# Patient Record
Sex: Male | Born: 1950 | Race: White | Hispanic: No | Smoking: Never smoker
Health system: Southern US, Community
[De-identification: ages and names within clinical notes are randomized; demographics above are authoritative.]

## PROBLEM LIST (undated history)

## (undated) DIAGNOSIS — Z87442 Personal history of urinary calculi: Secondary | ICD-10-CM

## (undated) DIAGNOSIS — E119 Type 2 diabetes mellitus without complications: Secondary | ICD-10-CM

## (undated) HISTORY — PX: LITHOTRIPSY: SUR834

---

## 2003-01-01 ENCOUNTER — Encounter: Admission: RE | Admit: 2003-01-01 | Discharge: 2003-01-01 | Payer: Self-pay | Admitting: Family Medicine

## 2010-06-09 ENCOUNTER — Other Ambulatory Visit: Payer: Self-pay | Admitting: Orthopaedic Surgery

## 2010-06-09 DIAGNOSIS — M545 Low back pain: Secondary | ICD-10-CM

## 2010-06-10 ENCOUNTER — Ambulatory Visit
Admission: RE | Admit: 2010-06-10 | Discharge: 2010-06-10 | Disposition: A | Discharge status: Home / Self Care | Payer: 59 | Source: Ambulatory Visit | Attending: Orthopaedic Surgery | Admitting: Orthopaedic Surgery

## 2010-06-10 DIAGNOSIS — M545 Low back pain, unspecified: Secondary | ICD-10-CM

## 2010-07-23 ENCOUNTER — Other Ambulatory Visit: Payer: Self-pay | Admitting: Family Medicine

## 2010-07-23 DIAGNOSIS — R1030 Lower abdominal pain, unspecified: Secondary | ICD-10-CM

## 2010-07-29 ENCOUNTER — Ambulatory Visit
Admission: RE | Admit: 2010-07-29 | Discharge: 2010-07-29 | Disposition: A | Discharge status: Home / Self Care | Payer: 59 | Source: Ambulatory Visit | Attending: Family Medicine | Admitting: Family Medicine

## 2010-07-29 DIAGNOSIS — R1030 Lower abdominal pain, unspecified: Secondary | ICD-10-CM

## 2010-07-29 MED ORDER — IOHEXOL 300 MG/ML  SOLN
100.0000 mL | Freq: Once | INTRAMUSCULAR | Status: AC | PRN
  Administered 2010-07-29: 100 mL via INTRAVENOUS

## 2010-08-21 ENCOUNTER — Telehealth: Payer: Self-pay | Admitting: Cardiology

## 2010-08-21 NOTE — Telephone Encounter (Signed)
Called wanting to schedule an appointment. Had a CT scan (accession number: [id number]) from [facility], and his Prime Doctor saw some calcifications in his Coronary Artery and thought he might have coronary Artery disease and advised him to follow up with his cardiologist. HE's also noticed the veins in his legs starting to bulge out and it seems odd  He doesn't feel comfortable waiting until August. Please call back.

## 2010-08-25 ENCOUNTER — Other Ambulatory Visit: Payer: Self-pay | Admitting: Family Medicine

## 2010-08-25 DIAGNOSIS — M79609 Pain in unspecified limb: Secondary | ICD-10-CM

## 2010-08-27 ENCOUNTER — Ambulatory Visit
Admission: RE | Admit: 2010-08-27 | Discharge: 2010-08-27 | Disposition: A | Discharge status: Home / Self Care | Payer: 59 | Source: Ambulatory Visit | Attending: Family Medicine | Admitting: Family Medicine

## 2010-08-27 ENCOUNTER — Other Ambulatory Visit: Payer: 59

## 2010-08-27 DIAGNOSIS — M79609 Pain in unspecified limb: Secondary | ICD-10-CM

## 2010-09-18 ENCOUNTER — Other Ambulatory Visit (HOSPITAL_BASED_OUTPATIENT_CLINIC_OR_DEPARTMENT_OTHER): Payer: Self-pay | Admitting: Family Medicine

## 2010-09-18 DIAGNOSIS — R634 Abnormal weight loss: Secondary | ICD-10-CM

## 2010-09-19 ENCOUNTER — Other Ambulatory Visit (HOSPITAL_BASED_OUTPATIENT_CLINIC_OR_DEPARTMENT_OTHER): Payer: 59

## 2010-09-23 ENCOUNTER — Ambulatory Visit (INDEPENDENT_AMBULATORY_CARE_PROVIDER_SITE_OTHER)
Admission: RE | Admit: 2010-09-23 | Discharge: 2010-09-23 | Disposition: A | Discharge status: Home / Self Care | Payer: 59 | Source: Ambulatory Visit | Attending: Family Medicine | Admitting: Family Medicine

## 2010-09-23 ENCOUNTER — Ambulatory Visit (HOSPITAL_BASED_OUTPATIENT_CLINIC_OR_DEPARTMENT_OTHER)
Admission: RE | Admit: 2010-09-23 | Discharge: 2010-09-23 | Disposition: A | Discharge status: Home / Self Care | Payer: 59 | Source: Ambulatory Visit | Attending: Family Medicine | Admitting: Family Medicine

## 2010-09-23 DIAGNOSIS — K5730 Diverticulosis of large intestine without perforation or abscess without bleeding: Secondary | ICD-10-CM

## 2010-09-23 DIAGNOSIS — N400 Enlarged prostate without lower urinary tract symptoms: Secondary | ICD-10-CM | POA: Insufficient documentation

## 2010-09-23 DIAGNOSIS — N509 Disorder of male genital organs, unspecified: Secondary | ICD-10-CM

## 2010-09-23 DIAGNOSIS — R109 Unspecified abdominal pain: Secondary | ICD-10-CM

## 2010-09-23 DIAGNOSIS — N508 Other specified disorders of male genital organs: Secondary | ICD-10-CM | POA: Insufficient documentation

## 2010-09-23 DIAGNOSIS — Q619 Cystic kidney disease, unspecified: Secondary | ICD-10-CM | POA: Insufficient documentation

## 2010-09-23 DIAGNOSIS — R634 Abnormal weight loss: Secondary | ICD-10-CM

## 2010-09-23 DIAGNOSIS — E119 Type 2 diabetes mellitus without complications: Secondary | ICD-10-CM | POA: Insufficient documentation

## 2010-09-23 MED ORDER — GADOBENATE DIMEGLUMINE 529 MG/ML IV SOLN
15.0000 mL | Freq: Once | INTRAVENOUS | Status: AC | PRN
  Administered 2010-09-23: 15 mL via INTRAVENOUS

## 2011-08-19 ENCOUNTER — Other Ambulatory Visit: Payer: Self-pay | Admitting: Cardiology

## 2011-08-19 ENCOUNTER — Encounter: Payer: Self-pay | Admitting: Cardiology

## 2011-11-04 ENCOUNTER — Encounter: Payer: Self-pay | Admitting: Cardiology

## 2012-04-16 IMAGING — US US EXTREM LOW VENOUS*L*
1 series · 14 of 24 positions shown · non-contrast
Comparison: None.

CLINICAL DATA: Left leg pain

LEFT LOWER EXTREMITY VENOUS DUPLEX ULTRASOUND
TECHNIQUE: Gray-scale sonography with graded compression, as well
as color Doppler and duplex ultrasound, were performed to evaluate
the deep venous system of the lower extremity from the level of the
common femoral vein through the popliteal and proximal calf veins.
Spectral Doppler was utilized to evaluate flow at rest and with
distal augmentation maneuvers.

[Series 1: us extrem low venous*left* · 14 of 27 slices shown]
[im 1/27]
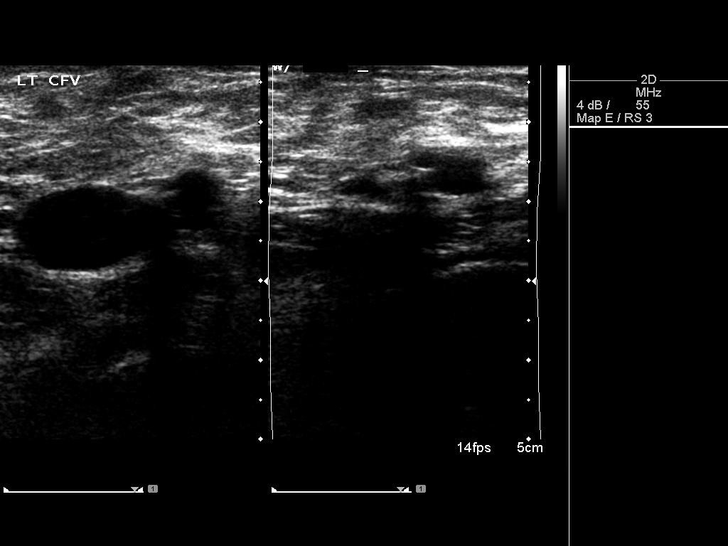
[im 3/27]
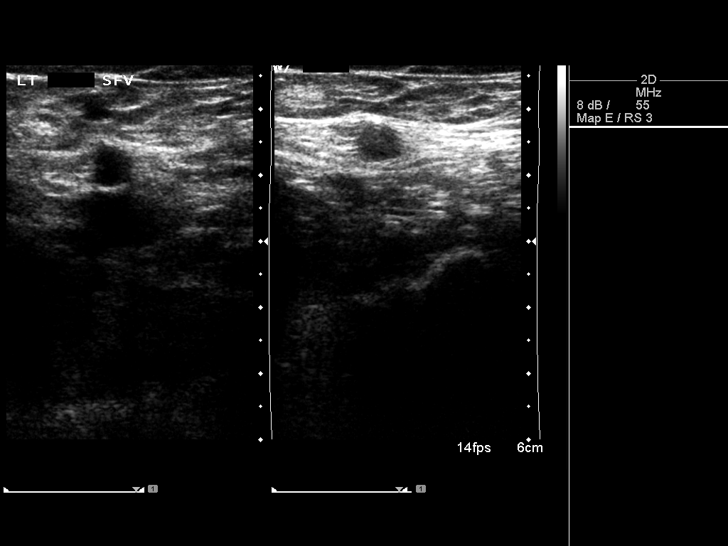
[im 5/27]
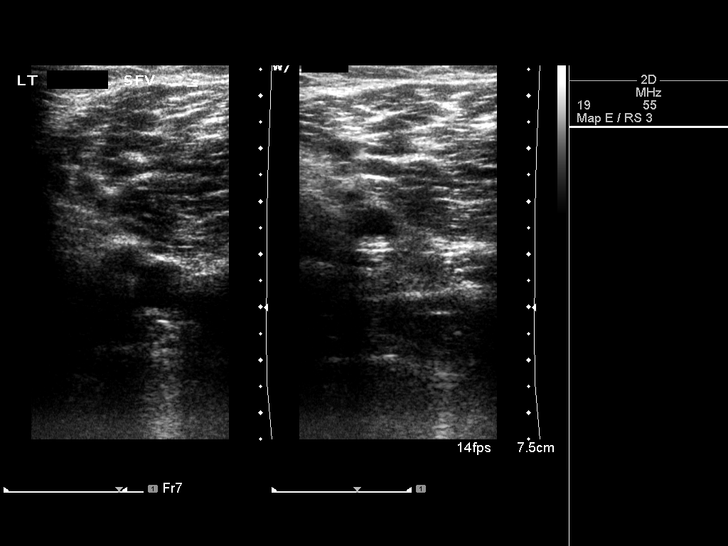
[im 7/27]
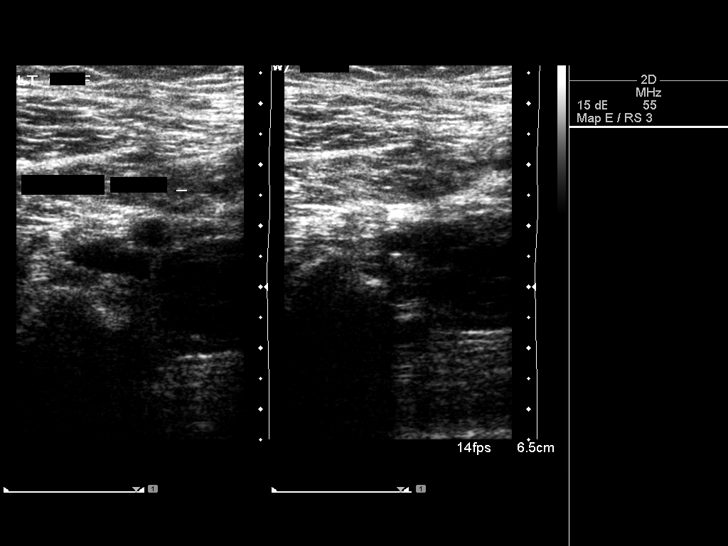
[im 8/27]
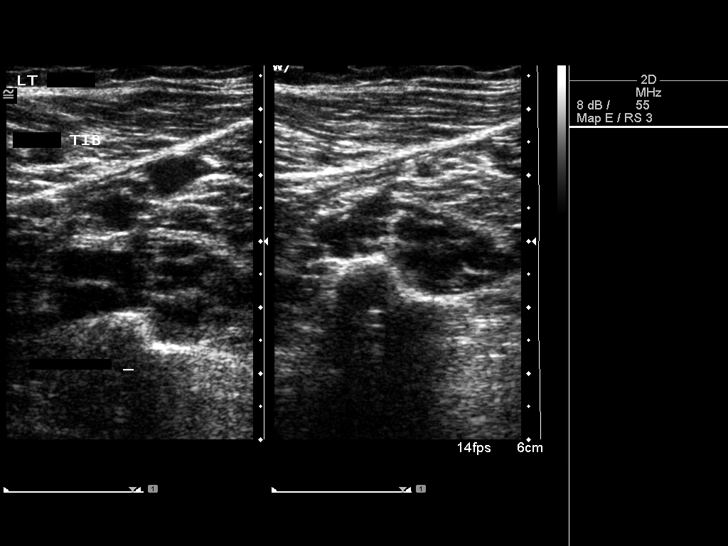
[im 11/27]
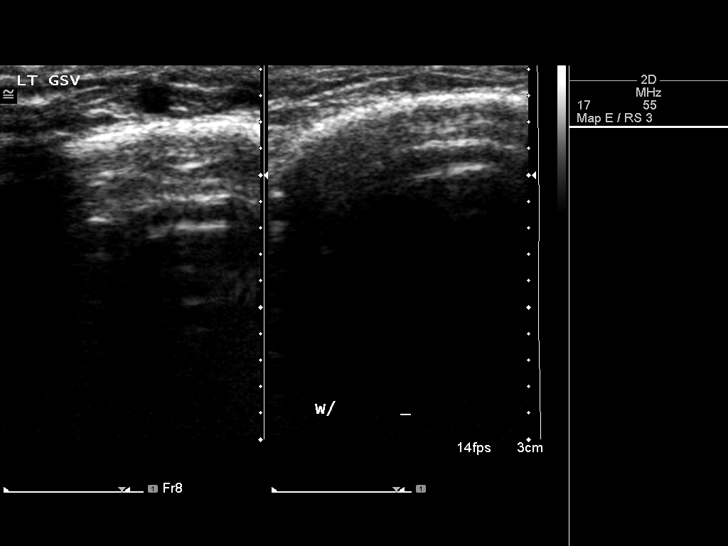
[im 13/27]
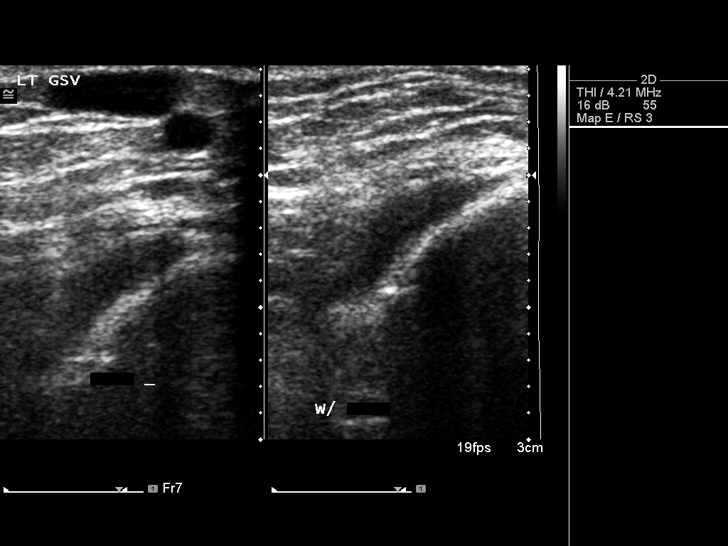
[im 14/27]
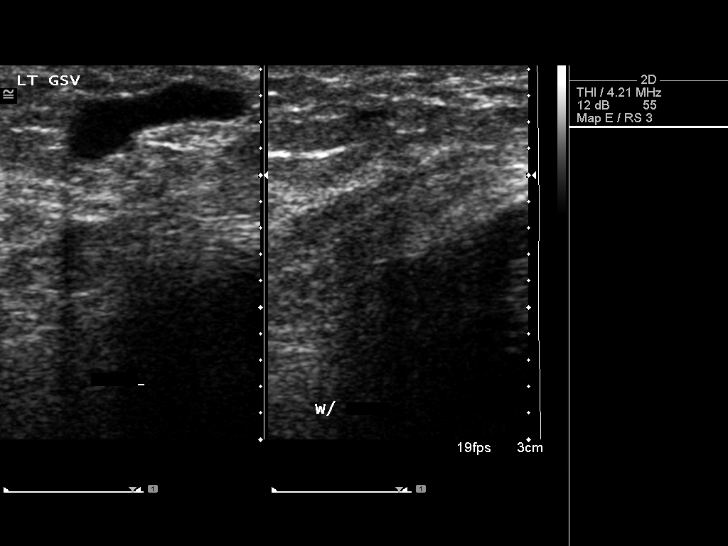
[im 16/27]
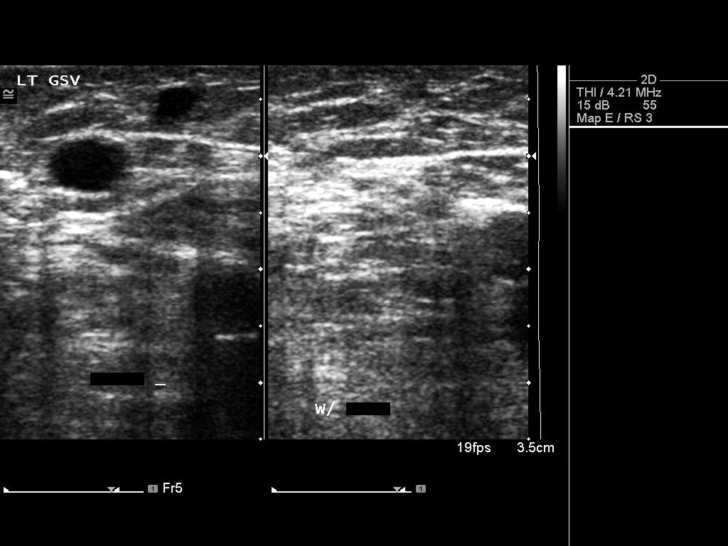
[im 19/27]
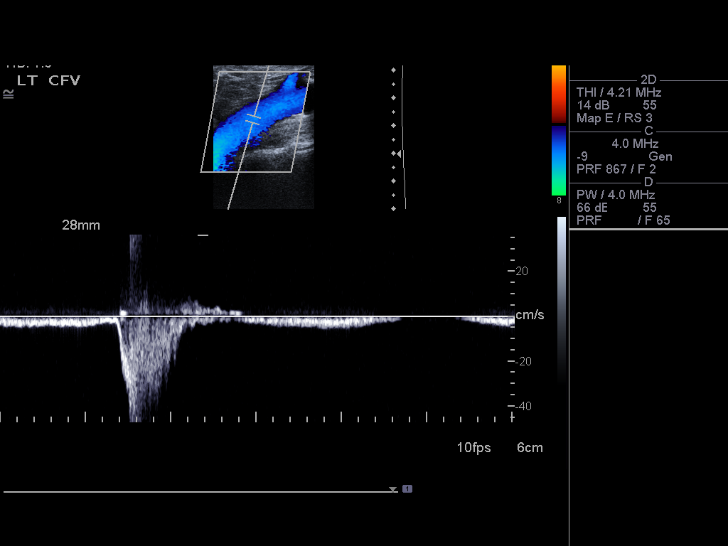
[im 21/27]
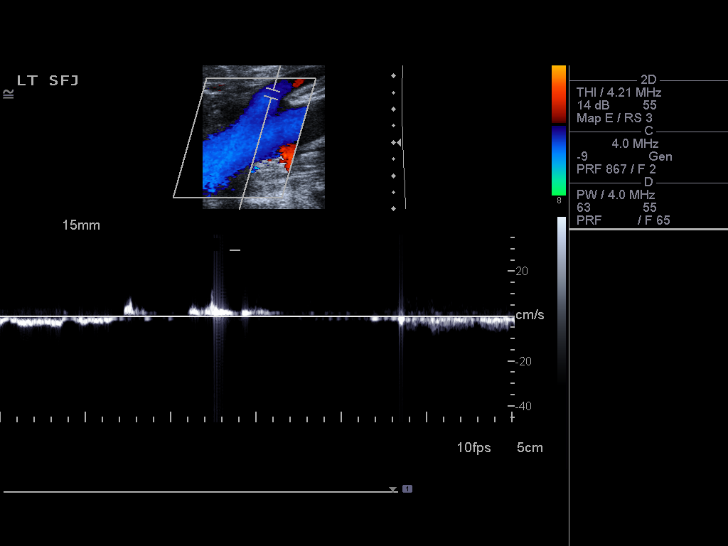
[im 22/27]
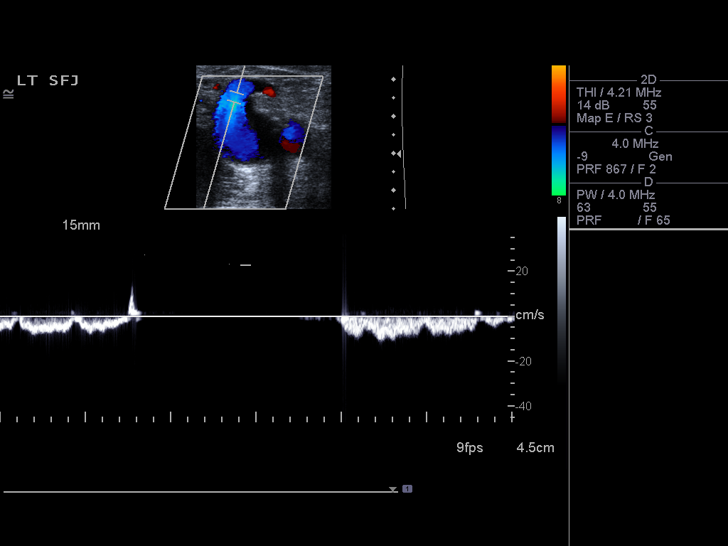
[im 24/27]
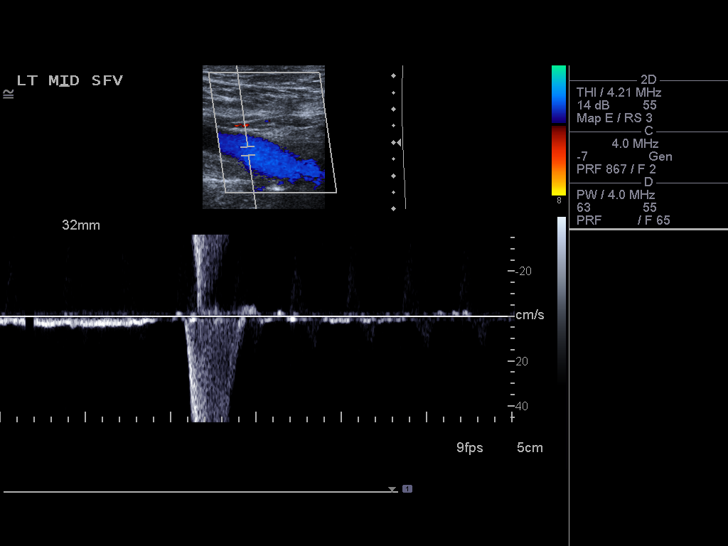
[im 27/27]
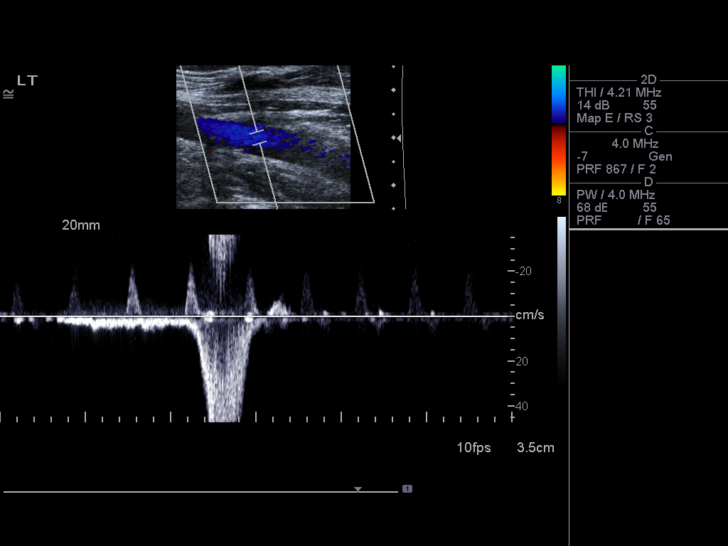

[14 of 24 positions shown; findings below may reference images not displayed]

FINDINGS: There is complete compressibility of the left common
femoral, left femoral, and left popliteal veins.  Doppler analysis
demonstrates respiratory phasicity and augmentation of flow upon
calf compression.

Innumerable varicosities are seen in the subcutaneous tissue of the
thigh and leg.
IMPRESSION: No evidence of DVT in the left lower extremity

Varicosities are noted in the thigh and leg.  Venous insufficiency
may be present.  Correlate clinically as for the need for
consultation with a vein specialist.

## 2014-03-13 DIAGNOSIS — G4700 Insomnia, unspecified: Secondary | ICD-10-CM | POA: Diagnosis not present

## 2014-03-13 DIAGNOSIS — E780 Pure hypercholesterolemia: Secondary | ICD-10-CM | POA: Diagnosis not present

## 2014-03-13 DIAGNOSIS — Z125 Encounter for screening for malignant neoplasm of prostate: Secondary | ICD-10-CM | POA: Diagnosis not present

## 2014-03-13 DIAGNOSIS — I10 Essential (primary) hypertension: Secondary | ICD-10-CM | POA: Diagnosis not present

## 2014-03-13 DIAGNOSIS — E119 Type 2 diabetes mellitus without complications: Secondary | ICD-10-CM | POA: Diagnosis not present

## 2014-09-25 DIAGNOSIS — G4700 Insomnia, unspecified: Secondary | ICD-10-CM | POA: Diagnosis not present

## 2014-09-25 DIAGNOSIS — I10 Essential (primary) hypertension: Secondary | ICD-10-CM | POA: Diagnosis not present

## 2014-09-25 DIAGNOSIS — Z23 Encounter for immunization: Secondary | ICD-10-CM | POA: Diagnosis not present

## 2014-09-25 DIAGNOSIS — E119 Type 2 diabetes mellitus without complications: Secondary | ICD-10-CM | POA: Diagnosis not present

## 2014-09-25 DIAGNOSIS — E780 Pure hypercholesterolemia: Secondary | ICD-10-CM | POA: Diagnosis not present

## 2014-11-15 DIAGNOSIS — R109 Unspecified abdominal pain: Secondary | ICD-10-CM | POA: Diagnosis not present

## 2014-11-15 DIAGNOSIS — N201 Calculus of ureter: Secondary | ICD-10-CM | POA: Diagnosis not present

## 2014-11-19 ENCOUNTER — Other Ambulatory Visit: Payer: Self-pay | Admitting: Urology

## 2014-11-20 ENCOUNTER — Encounter (HOSPITAL_COMMUNITY): Payer: Self-pay | Admitting: *Deleted

## 2014-11-24 NOTE — Discharge Instructions (Signed)

## 2014-11-25 ENCOUNTER — Ambulatory Visit (HOSPITAL_COMMUNITY)
Admission: RE | Admit: 2014-11-25 | Discharge: 2014-11-25 | Disposition: A | Discharge status: Home / Self Care | Payer: Medicare Other | Source: Ambulatory Visit | Attending: Urology | Admitting: Urology

## 2014-11-25 ENCOUNTER — Ambulatory Visit (HOSPITAL_COMMUNITY): Payer: Medicare Other

## 2014-11-25 ENCOUNTER — Encounter (HOSPITAL_COMMUNITY): Payer: Self-pay | Admitting: *Deleted

## 2014-11-25 ENCOUNTER — Encounter (HOSPITAL_COMMUNITY)
Admission: RE | Disposition: A | Discharge status: Home / Self Care | Payer: Self-pay | Source: Ambulatory Visit | Attending: Urology

## 2014-11-25 DIAGNOSIS — N400 Enlarged prostate without lower urinary tract symptoms: Secondary | ICD-10-CM | POA: Diagnosis not present

## 2014-11-25 DIAGNOSIS — R109 Unspecified abdominal pain: Secondary | ICD-10-CM | POA: Diagnosis present

## 2014-11-25 DIAGNOSIS — N201 Calculus of ureter: Secondary | ICD-10-CM | POA: Insufficient documentation

## 2014-11-25 DIAGNOSIS — E119 Type 2 diabetes mellitus without complications: Secondary | ICD-10-CM | POA: Diagnosis not present

## 2014-11-25 DIAGNOSIS — Z79899 Other long term (current) drug therapy: Secondary | ICD-10-CM | POA: Insufficient documentation

## 2014-11-25 DIAGNOSIS — Z791 Long term (current) use of non-steroidal anti-inflammatories (NSAID): Secondary | ICD-10-CM | POA: Diagnosis not present

## 2014-11-25 DIAGNOSIS — E780 Pure hypercholesterolemia: Secondary | ICD-10-CM | POA: Insufficient documentation

## 2014-11-25 DIAGNOSIS — Z87442 Personal history of urinary calculi: Secondary | ICD-10-CM | POA: Diagnosis not present

## 2014-11-25 DIAGNOSIS — Z841 Family history of disorders of kidney and ureter: Secondary | ICD-10-CM | POA: Insufficient documentation

## 2014-11-25 DIAGNOSIS — Z87891 Personal history of nicotine dependence: Secondary | ICD-10-CM | POA: Insufficient documentation

## 2014-11-25 DIAGNOSIS — I10 Essential (primary) hypertension: Secondary | ICD-10-CM | POA: Insufficient documentation

## 2014-11-25 DIAGNOSIS — Z7982 Long term (current) use of aspirin: Secondary | ICD-10-CM | POA: Diagnosis not present

## 2014-11-25 DIAGNOSIS — N200 Calculus of kidney: Secondary | ICD-10-CM | POA: Diagnosis not present

## 2014-11-25 HISTORY — DX: Personal history of urinary calculi: Z87.442

## 2014-11-25 HISTORY — DX: Type 2 diabetes mellitus without complications: E11.9

## 2014-11-25 LAB — GLUCOSE, CAPILLARY: Glucose-Capillary: 192 mg/dL — ABNORMAL HIGH (ref 65–99)

## 2014-11-25 SURGERY — LITHOTRIPSY, ESWL
Anesthesia: LOCAL | Laterality: Left

## 2014-11-25 MED ORDER — CIPROFLOXACIN HCL 500 MG PO TABS
500.0000 mg | ORAL_TABLET | ORAL | Status: AC
  Administered 2014-11-25: 500 mg via ORAL
  Filled 2014-11-25: qty 1

## 2014-11-25 MED ORDER — TAMSULOSIN HCL 0.4 MG PO CAPS: 0.4000 mg | ORAL_CAPSULE | ORAL | Status: DC

## 2014-11-25 MED ORDER — OXYCODONE HCL 10 MG PO TABS: 10.0000 mg | ORAL_TABLET | ORAL | Status: DC | PRN

## 2014-11-25 MED ORDER — DIPHENHYDRAMINE HCL 25 MG PO CAPS
25.0000 mg | ORAL_CAPSULE | ORAL | Status: AC
  Administered 2014-11-25: 25 mg via ORAL
  Filled 2014-11-25: qty 1

## 2014-11-25 MED ORDER — SODIUM CHLORIDE 0.9 % IV SOLN
INTRAVENOUS | Status: DC
  Administered 2014-11-25: 09:00:00 via INTRAVENOUS

## 2014-11-25 MED ORDER — DIAZEPAM 5 MG PO TABS
10.0000 mg | ORAL_TABLET | ORAL | Status: AC
  Administered 2014-11-25: 10 mg via ORAL
  Filled 2014-11-25: qty 2

## 2014-11-25 NOTE — H&P (Signed)
History of Present Illness               Patient was seen recently with acute onset of left flank pain. Nothing seems to make the pain better or worse. It has eased off over the past 48 hours. He now rates it as a "6 of 10". He had nausea and vomiting 2 days ago. Pain seems to be more on the left side may be moving toward the left lower quadrant now. He said no dysuria or gross hematuria. He's had a weak stream. He has a history of kidney stones but has not seen a stone pass.      Prior GU hx:  1- pelvic pain - chronic pain in perineum. Pain is between scrotum and rectum. Hurts with BM. He has constipation. Started flomax for the pain. Also tried mobic, cipro and scrotal support. Cipro for 30 days. None of these have helped. He has lost weight. He complains of impotence and no ejaculate volume. PSA 1.52 Jan 2012. Nov 2011 scrotal US normal. He had a CT Abd/pelvis Jul 29, 2010 that showed showed bilateral renal calculi and a right lower pole renal lesion that measured 1.1 cm that was indeterminate and probably a complex cyst. July 2012 patient underwent MRI Abd and Pelvis - I reviewed images - no hydronephrosis, cyst in right kidney confirmed simple, BPH, otherwise normal. An MRI of his lumbar spine April 2012 negative (done for pain in his lower back).     2 - BPH - He complains of impotence and no ejaculate volume. His DRE normal July 2012. PSA 1.52 Jan 2012. He has seen an endocrinologist due to the weight loss and per pt everything was normal. Also has had colonoscopy. Stopped tamsulosin and Potassium citrate as well as some other meds to see if there was any medicine causing him to lose weight.    3 - Microhematuria - cystoscopy was performed July 2012 and was normal apart from BPH and a large median lobe.     4 - history of stones. He passed a stone in Dec 2011 which was Calcium Oxalate. Urorisk 2011 showed His total volume was 1.6 L, his urinary citrate was very low at 274, his calcium  and oxalate were normal, but he had over saturation of calcium oxalate and brushite.       5-PCa screening-  -Jun 2013 PSA 2.2   -Jan 2016 PSA 2.4         Past Medical History Problems  1. History of Epididymitis, right (N45.1) 2. History of diabetes mellitus (Z86.39) 3. History of hypercholesterolemia (Z86.39)  Surgical History Problems  1. History of No Surgical Problems  Current Meds 1. Aspirin 81 MG TABS;  Therapy: (Recorded:13Jun2012) to Recorded 2. Ibuprofen CAPS;  Therapy: (Recorded:13Jun2012) to Recorded 3. Lisinopril 5 MG Oral Tablet;  Therapy: (Recorded:16Sep2016) to Recorded 4. MetFORMIN HCl - 500 MG Oral Tablet; TAKE 1 TABLET DAILY;  Therapy: (Recorded:12Jun2013) to Recorded 5. Zolpidem Tartrate TABS; TAKE 1 TABLET DAILY AT BEDTIME;  Therapy: (Recorded:30Nov2011) to Recorded  Allergies Medication  1. No Known Drug Allergies  Family History Problems  1. Family history of Family Health Status Number Of Children   1 daughter 2. Family history of Hematuria : Father 3. Family history of Nephrolithiasis : Father  Social History Problems    Alcohol Use   1   Caffeine Use   3 cups; coffee   Former smoker [phone])   smoked for 5 yrs & quit in 1976  Marital History - Currently Married   Retired From Work  Review of Systems Genitourinary, constitutional, skin, eye, otolaryngeal, hematologic/lymphatic, cardiovascular, pulmonary, endocrine, musculoskeletal, gastrointestinal, neurological and psychiatric system(s) were reviewed and pertinent findings if present are noted and are otherwise negative.  Gastrointestinal: nausea and vomiting.  Constitutional: fever.  Hematologic/Lymphatic: a tendency to easily bruise.    Vitals Vital Signs  Height: 5 ft 6 in Weight: 165 lb  BMI Calculated: 26.63 BSA Calculated: 1.84 Blood Pressure: 154 / 97 Temperature: 98.3 F Heart Rate: 89  Physical Exam Constitutional: Well nourished and well  developed . No acute distress.  ENT:. The ears and nose are normal in appearance.  Neck: The appearance of the neck is normal and no neck mass is present.  Pulmonary: No respiratory distress and normal respiratory rhythm and effort.  Cardiovascular: Heart rate and rhythm are normal . No peripheral edema.  Abdomen: The abdomen is soft and nontender. No masses are palpated. No CVA tenderness. No hernias are palpable. No hepatosplenomegaly noted.  Rectal: Rectal exam demonstrates normal sphincter tone, no tenderness and no masses. The prostate has no nodularity and is not tender. The left seminal vesicle is nonpalpable. The right seminal vesicle is nonpalpable. The perineum is normal on inspection.  Genitourinary: Examination of the penis demonstrates no discharge, no masses, no lesions and a normal meatus. The scrotum is without lesions. The right epididymis is palpably normal and non-tender. The left epididymis is palpably normal and non-tender. The right testis is non-tender and without masses. The left testis is non-tender and without masses.  Lymphatics: The femoral and inguinal nodes are not enlarged or tender.  Skin: Normal skin turgor, no visible rash and no visible skin lesions.  Neuro/Psych:. Mood and affect are appropriate.    Results/Data Urine COLOR YELLOW  APPEARANCE CLEAR  SPECIFIC GRAVITY <1.005  pH 6.0  GLUCOSE 2+  BILIRUBIN NEGATIVE  KETONE NEGATIVE  BLOOD 3+  PROTEIN NEGATIVE  NITRITE NEGATIVE  LEUKOCYTE ESTERASE NEGATIVE  SQUAMOUS EPITHELIAL/HPF NONE SEEN HPF WBC 0-5 WBC/HPF RBC 0-2 RBC/HPF BACTERIA NONE SEEN HPF CRYSTALS NONE SEEN HPF CASTS NONE SEEN LPF Yeast NONE SEEN HPF    Assessment Left ureteral stone   Plan  Left ureteral stone-I discussed with the patient the nature of risks benefits and alternatives to continued stone passage, addition of all fluid will call for blocker use, shockwave lithotripsy or ureteroscopy. All questions answered. Patient has  had a stent before and hoped to avoid another one. He wants to think about it over the weekend and will let me know Monday what he wants to do. He is considering shockwave lithotripsy otherwise I made a follow up with one of the nurse practitioners for a week or 2 with a KUB to check his progress. We could then set him up for shockwave lithotripsy if he hasn't passed the stone.

## 2014-11-25 NOTE — Op Note (Signed)
See [facility] OP note scanned into chart. Also because of the size, density, location and other factors that cannot be anticipated I feel this will likely be a staged procedure. This fact supersedes any indication in the scanned [facility] operative note to the contrary.  

## 2014-12-09 DIAGNOSIS — N201 Calculus of ureter: Secondary | ICD-10-CM | POA: Diagnosis not present

## 2015-02-17 DIAGNOSIS — E119 Type 2 diabetes mellitus without complications: Secondary | ICD-10-CM | POA: Diagnosis not present

## 2015-04-04 DIAGNOSIS — E7800 Pure hypercholesterolemia, unspecified: Secondary | ICD-10-CM | POA: Diagnosis not present

## 2015-04-04 DIAGNOSIS — Z0000 Encounter for general adult medical examination without abnormal findings: Secondary | ICD-10-CM | POA: Diagnosis not present

## 2015-04-04 DIAGNOSIS — I10 Essential (primary) hypertension: Secondary | ICD-10-CM | POA: Diagnosis not present

## 2015-04-04 DIAGNOSIS — E119 Type 2 diabetes mellitus without complications: Secondary | ICD-10-CM | POA: Diagnosis not present

## 2015-04-04 DIAGNOSIS — G4700 Insomnia, unspecified: Secondary | ICD-10-CM | POA: Diagnosis not present

## 2015-04-04 DIAGNOSIS — Z7984 Long term (current) use of oral hypoglycemic drugs: Secondary | ICD-10-CM | POA: Diagnosis not present

## 2015-11-04 DIAGNOSIS — I10 Essential (primary) hypertension: Secondary | ICD-10-CM | POA: Diagnosis not present

## 2015-11-04 DIAGNOSIS — Z1159 Encounter for screening for other viral diseases: Secondary | ICD-10-CM | POA: Diagnosis not present

## 2015-11-04 DIAGNOSIS — E7800 Pure hypercholesterolemia, unspecified: Secondary | ICD-10-CM | POA: Diagnosis not present

## 2015-11-04 DIAGNOSIS — G4700 Insomnia, unspecified: Secondary | ICD-10-CM | POA: Diagnosis not present

## 2015-11-04 DIAGNOSIS — Z23 Encounter for immunization: Secondary | ICD-10-CM | POA: Diagnosis not present

## 2015-11-04 DIAGNOSIS — E119 Type 2 diabetes mellitus without complications: Secondary | ICD-10-CM | POA: Diagnosis not present

## 2015-12-23 DIAGNOSIS — Z23 Encounter for immunization: Secondary | ICD-10-CM | POA: Diagnosis not present

## 2016-05-12 DIAGNOSIS — E7800 Pure hypercholesterolemia, unspecified: Secondary | ICD-10-CM | POA: Diagnosis not present

## 2016-05-12 DIAGNOSIS — I2510 Atherosclerotic heart disease of native coronary artery without angina pectoris: Secondary | ICD-10-CM | POA: Diagnosis not present

## 2016-05-12 DIAGNOSIS — I10 Essential (primary) hypertension: Secondary | ICD-10-CM | POA: Diagnosis not present

## 2016-05-12 DIAGNOSIS — E119 Type 2 diabetes mellitus without complications: Secondary | ICD-10-CM | POA: Diagnosis not present

## 2016-05-12 DIAGNOSIS — G4700 Insomnia, unspecified: Secondary | ICD-10-CM | POA: Diagnosis not present

## 2016-05-12 DIAGNOSIS — Z0000 Encounter for general adult medical examination without abnormal findings: Secondary | ICD-10-CM | POA: Diagnosis not present

## 2016-07-15 IMAGING — CR DG ABDOMEN 1V
2 series · 2 of 2 positions shown · non-contrast
Comparison: CT scan of November 15, 2014.

CLINICAL DATA: Left ureteral stone.

EXAM:
ABDOMEN - 1 VIEW

[t abdomen supine (1 of 2)]
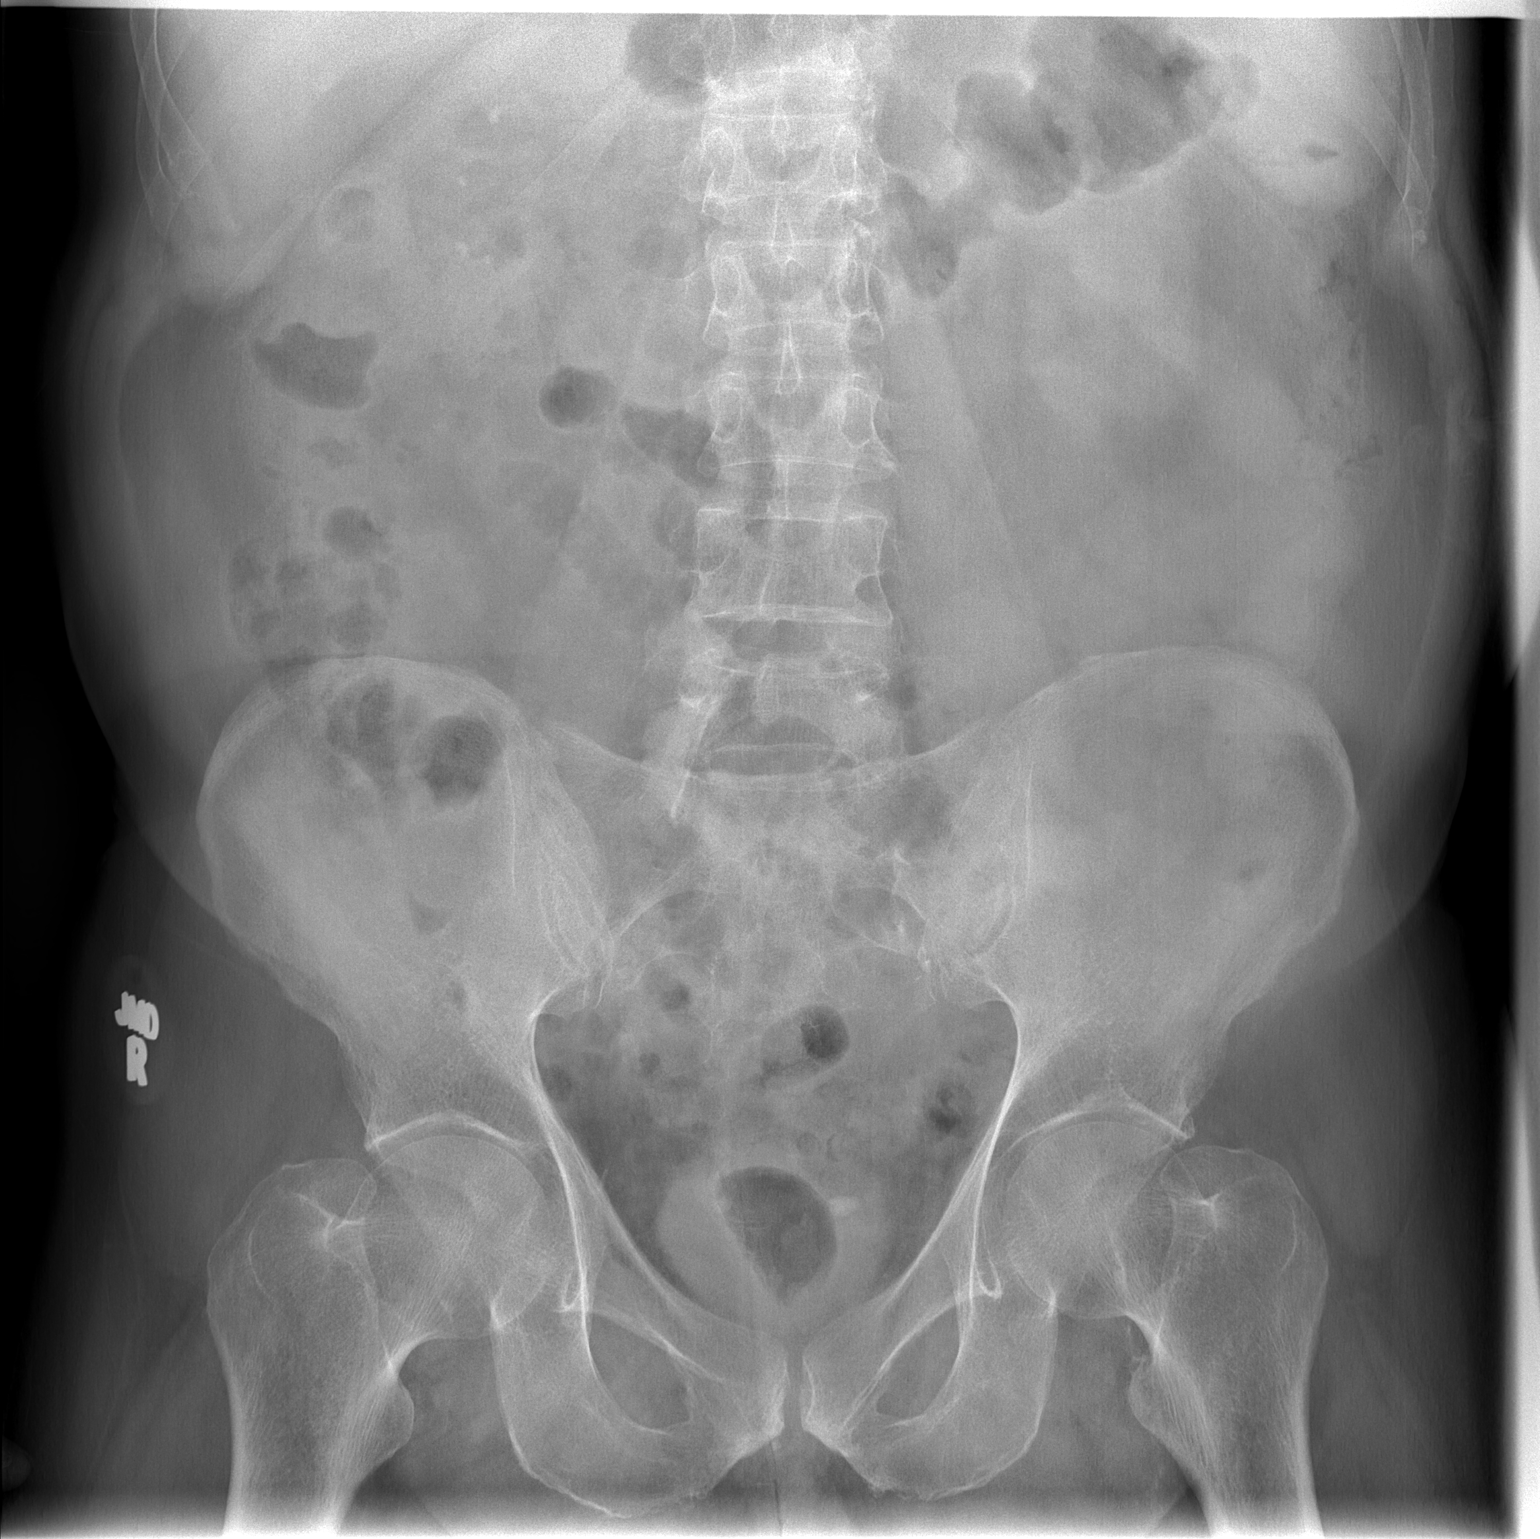

[t abdomen supine (2 of 2)]
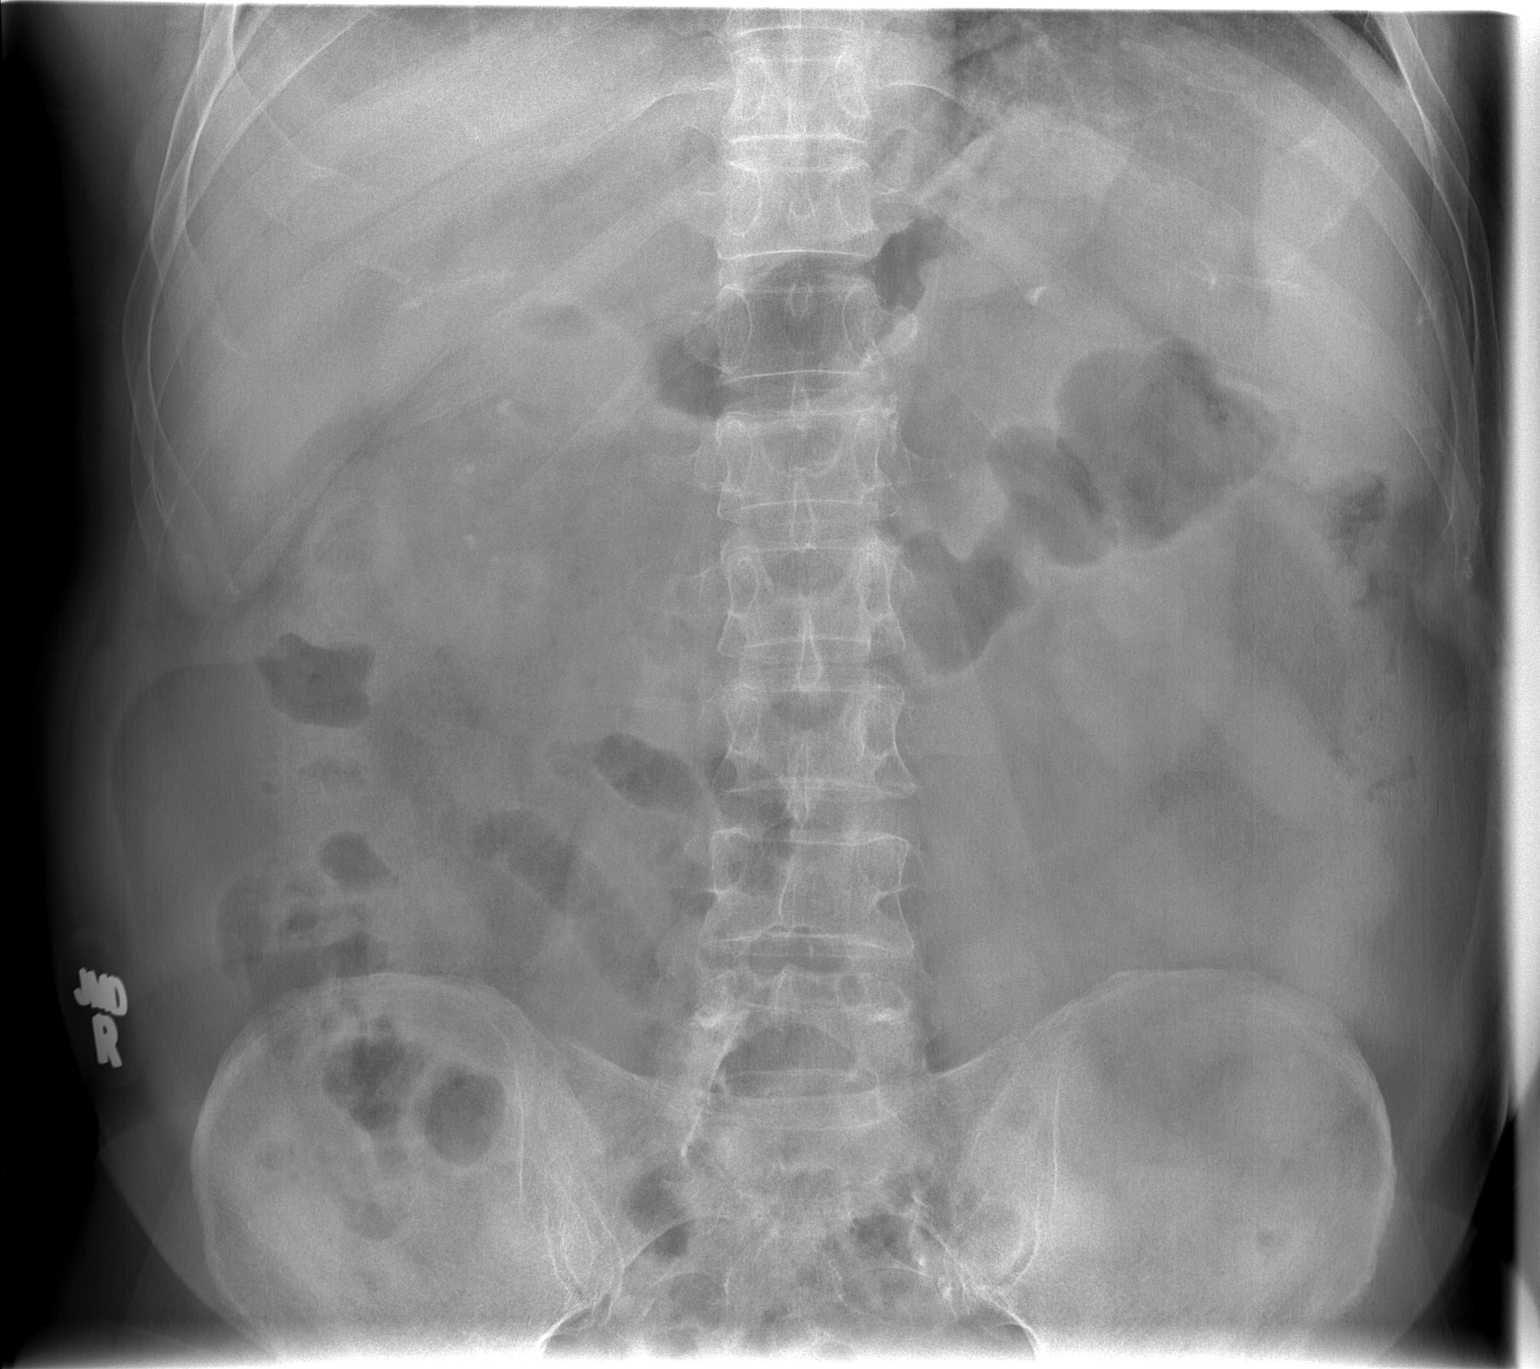

[2 of 2 positions shown; findings below may reference images not displayed]

FINDINGS: The bowel gas pattern is normal. Right nephrolithiasis is noted.
Large oval shaped calculus is noted in the left pelvis which most
likely represents distal left ureteral calculus seen on prior CT
scan ; it appears to have progressed significantly distally since
prior exam.
IMPRESSION: No evidence of bowel obstruction or ileus. Right nephrolithiasis is
noted. Distal left ureteral calculus noted on prior CT scan appears
to have progressed distally significantly since prior exam.

## 2016-11-24 DIAGNOSIS — Z7984 Long term (current) use of oral hypoglycemic drugs: Secondary | ICD-10-CM | POA: Diagnosis not present

## 2016-11-24 DIAGNOSIS — E7800 Pure hypercholesterolemia, unspecified: Secondary | ICD-10-CM | POA: Diagnosis not present

## 2016-11-24 DIAGNOSIS — G4700 Insomnia, unspecified: Secondary | ICD-10-CM | POA: Diagnosis not present

## 2016-11-24 DIAGNOSIS — I10 Essential (primary) hypertension: Secondary | ICD-10-CM | POA: Diagnosis not present

## 2016-11-24 DIAGNOSIS — E119 Type 2 diabetes mellitus without complications: Secondary | ICD-10-CM | POA: Diagnosis not present

## 2016-11-24 DIAGNOSIS — I2510 Atherosclerotic heart disease of native coronary artery without angina pectoris: Secondary | ICD-10-CM | POA: Diagnosis not present

## 2016-12-21 DIAGNOSIS — Z23 Encounter for immunization: Secondary | ICD-10-CM | POA: Diagnosis not present

## 2017-05-25 DIAGNOSIS — E119 Type 2 diabetes mellitus without complications: Secondary | ICD-10-CM | POA: Diagnosis not present

## 2017-05-25 DIAGNOSIS — Z7984 Long term (current) use of oral hypoglycemic drugs: Secondary | ICD-10-CM | POA: Diagnosis not present

## 2017-05-25 DIAGNOSIS — I2510 Atherosclerotic heart disease of native coronary artery without angina pectoris: Secondary | ICD-10-CM | POA: Diagnosis not present

## 2017-05-25 DIAGNOSIS — G4700 Insomnia, unspecified: Secondary | ICD-10-CM | POA: Diagnosis not present

## 2017-05-25 DIAGNOSIS — I10 Essential (primary) hypertension: Secondary | ICD-10-CM | POA: Diagnosis not present

## 2017-05-25 DIAGNOSIS — E7800 Pure hypercholesterolemia, unspecified: Secondary | ICD-10-CM | POA: Diagnosis not present

## 2017-05-25 DIAGNOSIS — Z0000 Encounter for general adult medical examination without abnormal findings: Secondary | ICD-10-CM | POA: Diagnosis not present

## 2017-05-25 DIAGNOSIS — Z1211 Encounter for screening for malignant neoplasm of colon: Secondary | ICD-10-CM | POA: Diagnosis not present

## 2017-05-25 DIAGNOSIS — Z125 Encounter for screening for malignant neoplasm of prostate: Secondary | ICD-10-CM | POA: Diagnosis not present

## 2017-06-06 DIAGNOSIS — E119 Type 2 diabetes mellitus without complications: Secondary | ICD-10-CM | POA: Diagnosis not present

## 2017-11-30 DIAGNOSIS — I10 Essential (primary) hypertension: Secondary | ICD-10-CM | POA: Diagnosis not present

## 2017-11-30 DIAGNOSIS — Z7984 Long term (current) use of oral hypoglycemic drugs: Secondary | ICD-10-CM | POA: Diagnosis not present

## 2017-11-30 DIAGNOSIS — G4700 Insomnia, unspecified: Secondary | ICD-10-CM | POA: Diagnosis not present

## 2017-11-30 DIAGNOSIS — E1159 Type 2 diabetes mellitus with other circulatory complications: Secondary | ICD-10-CM | POA: Diagnosis not present

## 2017-11-30 DIAGNOSIS — I2510 Atherosclerotic heart disease of native coronary artery without angina pectoris: Secondary | ICD-10-CM | POA: Diagnosis not present

## 2017-11-30 DIAGNOSIS — E7800 Pure hypercholesterolemia, unspecified: Secondary | ICD-10-CM | POA: Diagnosis not present

## 2017-12-13 DIAGNOSIS — Z23 Encounter for immunization: Secondary | ICD-10-CM | POA: Diagnosis not present

## 2018-05-31 DIAGNOSIS — I10 Essential (primary) hypertension: Secondary | ICD-10-CM | POA: Diagnosis not present

## 2018-05-31 DIAGNOSIS — I2510 Atherosclerotic heart disease of native coronary artery without angina pectoris: Secondary | ICD-10-CM | POA: Diagnosis not present

## 2018-05-31 DIAGNOSIS — Z125 Encounter for screening for malignant neoplasm of prostate: Secondary | ICD-10-CM | POA: Diagnosis not present

## 2018-05-31 DIAGNOSIS — G4700 Insomnia, unspecified: Secondary | ICD-10-CM | POA: Diagnosis not present

## 2018-05-31 DIAGNOSIS — E1159 Type 2 diabetes mellitus with other circulatory complications: Secondary | ICD-10-CM | POA: Diagnosis not present

## 2018-05-31 DIAGNOSIS — E7800 Pure hypercholesterolemia, unspecified: Secondary | ICD-10-CM | POA: Diagnosis not present

## 2018-11-28 DIAGNOSIS — Z23 Encounter for immunization: Secondary | ICD-10-CM | POA: Diagnosis not present

## 2018-12-06 DIAGNOSIS — I2510 Atherosclerotic heart disease of native coronary artery without angina pectoris: Secondary | ICD-10-CM | POA: Diagnosis not present

## 2018-12-06 DIAGNOSIS — I10 Essential (primary) hypertension: Secondary | ICD-10-CM | POA: Diagnosis not present

## 2018-12-06 DIAGNOSIS — G4700 Insomnia, unspecified: Secondary | ICD-10-CM | POA: Diagnosis not present

## 2018-12-06 DIAGNOSIS — E7800 Pure hypercholesterolemia, unspecified: Secondary | ICD-10-CM | POA: Diagnosis not present

## 2018-12-06 DIAGNOSIS — E1159 Type 2 diabetes mellitus with other circulatory complications: Secondary | ICD-10-CM | POA: Diagnosis not present

## 2019-03-27 ENCOUNTER — Ambulatory Visit: Payer: Medicare Other

## 2019-04-05 ENCOUNTER — Ambulatory Visit: Payer: Medicare Other | Attending: Internal Medicine

## 2019-04-05 DIAGNOSIS — Z23 Encounter for immunization: Secondary | ICD-10-CM

## 2019-04-05 NOTE — Progress Notes (Signed)
   Covid-19 Vaccination Clinic  Name:  Joel Pitts    MRN: [id number] DOB: 1950-11-13  04/05/2019  Joel Pitts was observed post Covid-19 immunization for 15 minutes without incidence. He was provided with Vaccine Information Sheet and instruction to access the V-Safe system.   Joel Pitts was instructed to call 911 with any severe reactions post vaccine: Marland Kitchen Difficulty breathing  . Swelling of your face and throat  . A fast heartbeat  . A bad rash all over your body  . Dizziness and weakness    Immunizations Administered    Name Date Dose VIS Date Route   Pfizer COVID-19 Vaccine 04/05/2019 11:04 AM 0.3 mL 02/09/2019 Intramuscular   Manufacturer: [employer], [employer]   Lot: [id number]   NDC: [id number]

## 2019-04-13 ENCOUNTER — Ambulatory Visit: Payer: Medicare Other

## 2019-04-30 ENCOUNTER — Ambulatory Visit: Payer: Medicare Other | Attending: Internal Medicine

## 2019-04-30 DIAGNOSIS — Z23 Encounter for immunization: Secondary | ICD-10-CM | POA: Insufficient documentation

## 2019-04-30 NOTE — Progress Notes (Signed)
   Covid-19 Vaccination Clinic  Name:  Joel Pitts    MRN: [id number] DOB: Jan 29, 1951  04/30/2019  [name] was observed post Covid-19 immunization for 15 minutes without incidence. He was provided with Vaccine Information Sheet and instruction to access the V-Safe system.   [name] was instructed to call 911 with any severe reactions post vaccine: Marland Kitchen Difficulty breathing  . Swelling of your face and throat  . A fast heartbeat  . A bad rash all over your body  . Dizziness and weakness    Immunizations Administered    Name Date Dose VIS Date Route   Pfizer COVID-19 Vaccine 04/30/2019  2:53 PM 0.3 mL 02/09/2019 Intramuscular   Manufacturer: [employer], [employer]   Lot: [id number]   NDC: [address]-0

## 2019-06-06 DIAGNOSIS — E7800 Pure hypercholesterolemia, unspecified: Secondary | ICD-10-CM | POA: Diagnosis not present

## 2019-06-06 DIAGNOSIS — Z0000 Encounter for general adult medical examination without abnormal findings: Secondary | ICD-10-CM | POA: Diagnosis not present

## 2019-06-06 DIAGNOSIS — I2510 Atherosclerotic heart disease of native coronary artery without angina pectoris: Secondary | ICD-10-CM | POA: Diagnosis not present

## 2019-06-06 DIAGNOSIS — E1159 Type 2 diabetes mellitus with other circulatory complications: Secondary | ICD-10-CM | POA: Diagnosis not present

## 2019-06-06 DIAGNOSIS — Z7984 Long term (current) use of oral hypoglycemic drugs: Secondary | ICD-10-CM | POA: Diagnosis not present

## 2019-06-06 DIAGNOSIS — I10 Essential (primary) hypertension: Secondary | ICD-10-CM | POA: Diagnosis not present

## 2019-06-06 DIAGNOSIS — G4700 Insomnia, unspecified: Secondary | ICD-10-CM | POA: Diagnosis not present

## 2019-08-27 DIAGNOSIS — E1159 Type 2 diabetes mellitus with other circulatory complications: Secondary | ICD-10-CM | POA: Diagnosis not present

## 2019-08-27 DIAGNOSIS — I2510 Atherosclerotic heart disease of native coronary artery without angina pectoris: Secondary | ICD-10-CM | POA: Diagnosis not present

## 2019-08-27 DIAGNOSIS — I10 Essential (primary) hypertension: Secondary | ICD-10-CM | POA: Diagnosis not present

## 2019-08-27 DIAGNOSIS — E7800 Pure hypercholesterolemia, unspecified: Secondary | ICD-10-CM | POA: Diagnosis not present

## 2019-10-30 DIAGNOSIS — E1159 Type 2 diabetes mellitus with other circulatory complications: Secondary | ICD-10-CM | POA: Diagnosis not present

## 2019-10-30 DIAGNOSIS — I10 Essential (primary) hypertension: Secondary | ICD-10-CM | POA: Diagnosis not present

## 2019-10-30 DIAGNOSIS — E7800 Pure hypercholesterolemia, unspecified: Secondary | ICD-10-CM | POA: Diagnosis not present

## 2019-10-30 DIAGNOSIS — I2510 Atherosclerotic heart disease of native coronary artery without angina pectoris: Secondary | ICD-10-CM | POA: Diagnosis not present

## 2019-12-05 DIAGNOSIS — E7800 Pure hypercholesterolemia, unspecified: Secondary | ICD-10-CM | POA: Diagnosis not present

## 2019-12-05 DIAGNOSIS — Z794 Long term (current) use of insulin: Secondary | ICD-10-CM | POA: Diagnosis not present

## 2019-12-05 DIAGNOSIS — I10 Essential (primary) hypertension: Secondary | ICD-10-CM | POA: Diagnosis not present

## 2019-12-05 DIAGNOSIS — E1159 Type 2 diabetes mellitus with other circulatory complications: Secondary | ICD-10-CM | POA: Diagnosis not present

## 2019-12-05 DIAGNOSIS — Z125 Encounter for screening for malignant neoplasm of prostate: Secondary | ICD-10-CM | POA: Diagnosis not present

## 2019-12-05 DIAGNOSIS — I2510 Atherosclerotic heart disease of native coronary artery without angina pectoris: Secondary | ICD-10-CM | POA: Diagnosis not present

## 2019-12-05 DIAGNOSIS — G4700 Insomnia, unspecified: Secondary | ICD-10-CM | POA: Diagnosis not present

## 2019-12-06 DIAGNOSIS — E119 Type 2 diabetes mellitus without complications: Secondary | ICD-10-CM | POA: Diagnosis not present

## 2019-12-17 DIAGNOSIS — Z23 Encounter for immunization: Secondary | ICD-10-CM | POA: Diagnosis not present

## 2023-09-06 ENCOUNTER — Emergency Department (HOSPITAL_COMMUNITY)

## 2023-09-06 ENCOUNTER — Other Ambulatory Visit: Payer: Self-pay

## 2023-09-06 ENCOUNTER — Encounter (HOSPITAL_COMMUNITY): Payer: Self-pay | Admitting: Hospitalist

## 2023-09-06 ENCOUNTER — Inpatient Hospital Stay (HOSPITAL_COMMUNITY)
Admission: EM | Admit: 2023-09-06 | Discharge: 2023-09-23 | DRG: 233 | Disposition: A | Discharge status: Home Health | Source: Ambulatory Visit | Attending: Thoracic Surgery (Cardiothoracic Vascular Surgery) | Admitting: Thoracic Surgery (Cardiothoracic Vascular Surgery)

## 2023-09-06 DIAGNOSIS — I252 Old myocardial infarction: Secondary | ICD-10-CM

## 2023-09-06 DIAGNOSIS — I110 Hypertensive heart disease with heart failure: Secondary | ICD-10-CM | POA: Diagnosis not present

## 2023-09-06 DIAGNOSIS — J9811 Atelectasis: Secondary | ICD-10-CM | POA: Diagnosis not present

## 2023-09-06 DIAGNOSIS — I272 Pulmonary hypertension, unspecified: Secondary | ICD-10-CM | POA: Diagnosis present

## 2023-09-06 DIAGNOSIS — E861 Hypovolemia: Secondary | ICD-10-CM | POA: Diagnosis present

## 2023-09-06 DIAGNOSIS — I10 Essential (primary) hypertension: Secondary | ICD-10-CM

## 2023-09-06 DIAGNOSIS — J9601 Acute respiratory failure with hypoxia: Secondary | ICD-10-CM | POA: Diagnosis present

## 2023-09-06 DIAGNOSIS — I2510 Atherosclerotic heart disease of native coronary artery without angina pectoris: Principal | ICD-10-CM | POA: Diagnosis present

## 2023-09-06 DIAGNOSIS — Z7984 Long term (current) use of oral hypoglycemic drugs: Secondary | ICD-10-CM

## 2023-09-06 DIAGNOSIS — Z87442 Personal history of urinary calculi: Secondary | ICD-10-CM

## 2023-09-06 DIAGNOSIS — T8110XA Postprocedural shock unspecified, initial encounter: Secondary | ICD-10-CM | POA: Diagnosis not present

## 2023-09-06 DIAGNOSIS — K5900 Constipation, unspecified: Secondary | ICD-10-CM | POA: Diagnosis present

## 2023-09-06 DIAGNOSIS — E1122 Type 2 diabetes mellitus with diabetic chronic kidney disease: Secondary | ICD-10-CM | POA: Diagnosis present

## 2023-09-06 DIAGNOSIS — E1165 Type 2 diabetes mellitus with hyperglycemia: Secondary | ICD-10-CM | POA: Diagnosis present

## 2023-09-06 DIAGNOSIS — E119 Type 2 diabetes mellitus without complications: Secondary | ICD-10-CM

## 2023-09-06 DIAGNOSIS — Z8249 Family history of ischemic heart disease and other diseases of the circulatory system: Secondary | ICD-10-CM

## 2023-09-06 DIAGNOSIS — Z951 Presence of aortocoronary bypass graft: Secondary | ICD-10-CM

## 2023-09-06 DIAGNOSIS — N179 Acute kidney failure, unspecified: Secondary | ICD-10-CM

## 2023-09-06 DIAGNOSIS — N400 Enlarged prostate without lower urinary tract symptoms: Secondary | ICD-10-CM | POA: Diagnosis present

## 2023-09-06 DIAGNOSIS — R000 Tachycardia, unspecified: Secondary | ICD-10-CM | POA: Diagnosis present

## 2023-09-06 DIAGNOSIS — Z79899 Other long term (current) drug therapy: Secondary | ICD-10-CM

## 2023-09-06 DIAGNOSIS — N170 Acute kidney failure with tubular necrosis: Secondary | ICD-10-CM | POA: Diagnosis not present

## 2023-09-06 DIAGNOSIS — Z888 Allergy status to other drugs, medicaments and biological substances status: Secondary | ICD-10-CM

## 2023-09-06 DIAGNOSIS — N1831 Chronic kidney disease, stage 3a: Secondary | ICD-10-CM | POA: Diagnosis present

## 2023-09-06 DIAGNOSIS — Z7901 Long term (current) use of anticoagulants: Secondary | ICD-10-CM

## 2023-09-06 DIAGNOSIS — I509 Heart failure, unspecified: Secondary | ICD-10-CM | POA: Diagnosis present

## 2023-09-06 DIAGNOSIS — J90 Pleural effusion, not elsewhere classified: Secondary | ICD-10-CM | POA: Diagnosis not present

## 2023-09-06 DIAGNOSIS — N990 Postprocedural (acute) (chronic) kidney failure: Secondary | ICD-10-CM | POA: Diagnosis not present

## 2023-09-06 DIAGNOSIS — R7989 Other specified abnormal findings of blood chemistry: Secondary | ICD-10-CM | POA: Diagnosis not present

## 2023-09-06 DIAGNOSIS — N200 Calculus of kidney: Secondary | ICD-10-CM | POA: Diagnosis present

## 2023-09-06 DIAGNOSIS — E43 Unspecified severe protein-calorie malnutrition: Secondary | ICD-10-CM | POA: Diagnosis present

## 2023-09-06 DIAGNOSIS — I513 Intracardiac thrombosis, not elsewhere classified: Secondary | ICD-10-CM | POA: Diagnosis present

## 2023-09-06 DIAGNOSIS — E785 Hyperlipidemia, unspecified: Secondary | ICD-10-CM | POA: Diagnosis present

## 2023-09-06 DIAGNOSIS — I5023 Acute on chronic systolic (congestive) heart failure: Secondary | ICD-10-CM | POA: Diagnosis present

## 2023-09-06 DIAGNOSIS — Z01810 Encounter for preprocedural cardiovascular examination: Secondary | ICD-10-CM | POA: Diagnosis not present

## 2023-09-06 DIAGNOSIS — Z7982 Long term (current) use of aspirin: Secondary | ICD-10-CM

## 2023-09-06 DIAGNOSIS — Y832 Surgical operation with anastomosis, bypass or graft as the cause of abnormal reaction of the patient, or of later complication, without mention of misadventure at the time of the procedure: Secondary | ICD-10-CM | POA: Diagnosis not present

## 2023-09-06 DIAGNOSIS — D72829 Elevated white blood cell count, unspecified: Secondary | ICD-10-CM | POA: Diagnosis present

## 2023-09-06 DIAGNOSIS — I255 Ischemic cardiomyopathy: Secondary | ICD-10-CM | POA: Diagnosis present

## 2023-09-06 DIAGNOSIS — E871 Hypo-osmolality and hyponatremia: Secondary | ICD-10-CM | POA: Diagnosis present

## 2023-09-06 DIAGNOSIS — I130 Hypertensive heart and chronic kidney disease with heart failure and stage 1 through stage 4 chronic kidney disease, or unspecified chronic kidney disease: Secondary | ICD-10-CM | POA: Diagnosis present

## 2023-09-06 DIAGNOSIS — Z6821 Body mass index (BMI) 21.0-21.9, adult: Secondary | ICD-10-CM | POA: Diagnosis not present

## 2023-09-06 DIAGNOSIS — E1159 Type 2 diabetes mellitus with other circulatory complications: Secondary | ICD-10-CM | POA: Diagnosis not present

## 2023-09-06 DIAGNOSIS — I5021 Acute systolic (congestive) heart failure: Secondary | ICD-10-CM | POA: Diagnosis not present

## 2023-09-06 LAB — BASIC METABOLIC PANEL WITH GFR
Anion gap: 12 (ref 5–15)
BUN: 16 mg/dL (ref 8–23)
CO2: 19 mmol/L — ABNORMAL LOW (ref 22–32)
Calcium: 9.3 mg/dL (ref 8.9–10.3)
Chloride: 97 mmol/L — ABNORMAL LOW (ref 98–111)
Creatinine, Ser: 1.53 mg/dL — ABNORMAL HIGH (ref 0.61–1.24)
GFR, Estimated: 47 mL/min — ABNORMAL LOW (ref >60)
Glucose, Bld: 243 mg/dL — ABNORMAL HIGH (ref 70–99)
Potassium: 4.8 mmol/L (ref 3.5–5.1)
Sodium: 128 mmol/L — ABNORMAL LOW (ref 135–145)

## 2023-09-06 LAB — CBC
HCT: 50.8 % (ref 39.0–52.0)
Hemoglobin: 17.9 g/dL — ABNORMAL HIGH (ref 13.0–17.0)
MCH: 32.8 pg (ref 26.0–34.0)
MCHC: 35.2 g/dL (ref 30.0–36.0)
MCV: 93.2 fL (ref 80.0–100.0)
Platelets: 219 K/uL (ref 150–400)
RBC: 5.45 MIL/uL (ref 4.22–5.81)
RDW: 12.9 % (ref 11.5–15.5)
WBC: 11 K/uL — ABNORMAL HIGH (ref 4.0–10.5)
nRBC: 0 % (ref 0.0–0.2)

## 2023-09-06 LAB — BRAIN NATRIURETIC PEPTIDE: B Natriuretic Peptide: 2251.1 pg/mL — ABNORMAL HIGH (ref 0.0–100.0)

## 2023-09-06 LAB — D-DIMER, QUANTITATIVE: D-Dimer, Quant: 1.89 ug{FEU}/mL — ABNORMAL HIGH (ref 0.00–0.50)

## 2023-09-06 LAB — TROPONIN I (HIGH SENSITIVITY)
Troponin I (High Sensitivity): 29 ng/L — ABNORMAL HIGH (ref <18)
Troponin I (High Sensitivity): 25 ng/L — ABNORMAL HIGH (ref <18)

## 2023-09-06 MED ORDER — FUROSEMIDE 10 MG/ML IJ SOLN
80.0000 mg | Freq: Once | INTRAMUSCULAR | Status: AC
  Administered 2023-09-06: 80 mg via INTRAVENOUS
  Filled 2023-09-06: qty 8

## 2023-09-06 MED ORDER — IOHEXOL 350 MG/ML SOLN
75.0000 mL | Freq: Once | INTRAVENOUS | Status: AC | PRN
  Administered 2023-09-06: 75 mL via INTRAVENOUS

## 2023-09-06 MED ORDER — LISINOPRIL 10 MG PO TABS
5.0000 mg | ORAL_TABLET | Freq: Every evening | ORAL | Status: DC
  Administered 2023-09-06: 5 mg via ORAL
  Filled 2023-09-06: qty 1

## 2023-09-06 MED ORDER — ENOXAPARIN SODIUM 40 MG/0.4ML IJ SOSY
40.0000 mg | PREFILLED_SYRINGE | INTRAMUSCULAR | Status: DC
  Filled 2023-09-06 (×3): qty 0.4

## 2023-09-06 MED ORDER — ASPIRIN 81 MG PO CHEW
81.0000 mg | CHEWABLE_TABLET | Freq: Every day | ORAL | Status: DC
  Administered 2023-09-07 – 2023-09-08 (×2): 81 mg via ORAL
  Filled 2023-09-06 (×2): qty 1

## 2023-09-06 MED ORDER — TAMSULOSIN HCL 0.4 MG PO CAPS
0.4000 mg | ORAL_CAPSULE | ORAL | Status: DC
  Administered 2023-09-06 – 2023-09-22 (×16): 0.4 mg via ORAL
  Filled 2023-09-06 (×17): qty 1

## 2023-09-06 NOTE — ED Triage Notes (Signed)
 Pt. Stated, Ive had chest pain, SOB, and feeling tired for 2 weeks and its just gotten worse. I went to my Dr. And he said just come here , it was faster.

## 2023-09-06 NOTE — ED Notes (Signed)
 D.dimer recollected and sent to the lab

## 2023-09-06 NOTE — ED Notes (Signed)
 Call received from lab, stating that D.dimer was hemolyzed, this nurse will recollect.

## 2023-09-06 NOTE — ED Provider Notes (Signed)
 [name] EMERGENCY DEPARTMENT AT [facility] Provider Note   CSN: [id number] Arrival date & time: 09/06/23  [id number]     Patient presents with: Chest Pain, Shortness of Breath, and Fatigue   Joel Pitts is a 73 y.o. male.   Patient is a 73 year old male with a history of diabetes and hypertension who presents with shortness of breath and fatigue.  He states that started about 2 weeks ago with what he describes as allergy symptoms.  He had a dry cough.  Maybe a little bit of runny nose.  Over the last week he has had progressive shortness of breath.  He particularly has issues during the night when he tries to lay down.  He says when his breathing gets bad and he is laying flat he developed some tightness across his chest.  The chest discomfort only happens at night when he is laying down.  He has had little bit of swelling in his legs which she does not normally have.  He said he has had some low-grade fevers up to 99.  No productive cough.  Its mostly dry cough.  No history of heart issues in the past.  He went to his PCP with [facility] in [facility] yesterday and was recommended to come to the emergency room so he came this morning.       Prior to Admission medications   Medication Sig Start Date End Date Taking? Authorizing Provider  aspirin  81 MG tablet Take 81 mg by mouth daily.    [provider]  lisinopril  (PRINIVIL ,ZESTRIL ) 5 MG tablet Take 5 mg by mouth every evening.     [provider]  metFORMIN (GLUCOPHAGE) 500 MG tablet Take 500 mg by mouth daily.     [provider]  Oxycodone  HCl 10 MG TABS Take 1 tablet (10 mg total) by mouth every 4 (four) hours as needed. 11/25/14   Ottelin, Mark, MD  tamsulosin  (FLOMAX ) 0.4 MG CAPS capsule Take 1 capsule (0.4 mg total) by mouth daily after supper. 11/25/14   Ottelin, Mark, MD  zolpidem (AMBIEN) 10 MG tablet Take 10 mg by mouth at bedtime as needed for sleep.    [provider]     Allergies: Patient has no known allergies.    Review of Systems  Constitutional:  Positive for fatigue. Negative for chills, diaphoresis and fever.  HENT:  Positive for rhinorrhea. Negative for congestion and sneezing.   Eyes: Negative.   Respiratory:  Positive for cough, chest tightness and shortness of breath.   Cardiovascular:  Positive for leg swelling.  Gastrointestinal:  Negative for abdominal pain, blood in stool, diarrhea, nausea and vomiting.  Genitourinary:  Negative for difficulty urinating, flank pain, frequency and hematuria.  Musculoskeletal:  Negative for arthralgias and back pain.  Skin:  Negative for rash.  Neurological:  Negative for dizziness, speech difficulty, weakness, numbness and headaches.    Updated Vital Signs BP (!) 128/91   Pulse (!) 108   Temp 97.7 F (36.5 C) (Oral)   Resp 17   Ht 5' 6 (1.676 m)   Wt 66.7 kg   SpO2 100%   BMI 23.73 kg/m   Physical Exam Constitutional:      Appearance: He is well-developed.  HENT:     Head: Normocephalic and atraumatic.  Eyes:     Pupils: Pupils are equal, round, and reactive to light.  Cardiovascular:     Rate and Rhythm: Regular rhythm. Tachycardia present.  Heart sounds: Normal heart sounds.  Pulmonary:     Effort: Pulmonary effort is normal. No respiratory distress.     Breath sounds: Rales (Few crackles in the bases) present. No wheezing.  Chest:     Chest wall: No tenderness.  Abdominal:     General: Bowel sounds are normal.     Palpations: Abdomen is soft.     Tenderness: There is no abdominal tenderness. There is no guarding or rebound.  Musculoskeletal:        General: Normal range of motion.     Cervical back: Normal range of motion and neck supple.     Comments: 1+ pitting edema to lower extremities bilaterally, no calf tenderness, no erythema  Lymphadenopathy:     Cervical: No cervical adenopathy.  Skin:    General: Skin is warm and dry.     Findings: No rash.  Neurological:      Mental Status: He is alert and oriented to person, place, and time.     (all labs ordered are listed, but only abnormal results are displayed) Labs Reviewed  BASIC METABOLIC PANEL WITH GFR - Abnormal; Notable for the following components:      Result Value   Sodium 128 (*)    Chloride 97 (*)    CO2 19 (*)    Glucose, Bld 243 (*)    Creatinine, Ser 1.53 (*)    GFR, Estimated 47 (*)    All other components within normal limits  CBC - Abnormal; Notable for the following components:   WBC 11.0 (*)    Hemoglobin 17.9 (*)    All other components within normal limits  BRAIN NATRIURETIC PEPTIDE - Abnormal; Notable for the following components:   B Natriuretic Peptide 2,251.1 (*)    All other components within normal limits  D-DIMER, QUANTITATIVE (NOT AT [facility]) - Abnormal; Notable for the following components:   D-Dimer, Quant 1.89 (*)    All other components within normal limits  TROPONIN I (HIGH SENSITIVITY) - Abnormal; Notable for the following components:   Troponin I (High Sensitivity) 29 (*)    All other components within normal limits  TROPONIN I (HIGH SENSITIVITY) - Abnormal; Notable for the following components:   Troponin I (High Sensitivity) 25 (*)    All other components within normal limits    EKG: EKG Interpretation Date/Time:  Tuesday September 06 2023 07:46:14 EDT Ventricular Rate:  111 PR Interval:  166 QRS Duration:  92 QT Interval:  340 QTC Calculation: 462 R Axis:   -30  Text Interpretation: Sinus tachycardia Left axis deviation Anteroseptal infarct , age undetermined T wave abnormality, consider lateral ischemia Abnormal ECG No previous ECGs available Confirmed by Lenor Hollering [phone]) on 09/06/2023 8:09:44 AM  Radiology: CT Angio Chest PE W/Cm &/Or Wo Cm Result Date: 09/06/2023 CLINICAL DATA:  Pulmonary embolism (PE) suspected, low to intermediate prob, positive D-dimer. Chest pain. Shortness of breath. EXAM: CT ANGIOGRAPHY CHEST WITH CONTRAST TECHNIQUE:  Multidetector CT imaging of the chest was performed using the standard protocol during bolus administration of intravenous contrast. Multiplanar CT image reconstructions and MIPs were obtained to evaluate the vascular anatomy. RADIATION DOSE REDUCTION: This exam was performed according to the departmental dose-optimization program which includes automated exposure control, adjustment of the mA and/or kV according to patient size and/or use of iterative reconstruction technique. CONTRAST:  75mL OMNIPAQUE  IOHEXOL  350 MG/ML SOLN COMPARISON:  None Available. FINDINGS: Cardiovascular: No evidence of embolism to the proximal subsegmental pulmonary artery level. Mild cardiomegaly. No  pericardial effusion. No aortic aneurysm. There are coronary artery calcifications, in keeping with coronary artery disease. There are also mild peripheral atherosclerotic vascular calcifications of thoracic aorta and its major branches. Mediastinum/Nodes: Visualized thyroid gland appears grossly unremarkable. No solid / cystic mediastinal masses. The esophagus is nondistended precluding optimal assessment. No axillary, mediastinal or hilar lymphadenopathy by size criteria. Lungs/Pleura: The central tracheo-bronchial tree is patent. There are bilateral moderate pleural effusions, right more than left with associated compressive atelectatic changes in the bilateral lower lobes. There additional patchy areas of linear, plate-like atelectasis and/or scarring throughout bilateral lungs. No suspicious lung nodules. Upper Abdomen: Visualized upper abdominal viscera within normal limits. Musculoskeletal: The visualized soft tissues of the chest wall are grossly unremarkable. No suspicious osseous lesions. There are mild multilevel degenerative changes in the visualized spine. There is moderate anterior wedging deformity of T7 vertebral body, of indeterminate age but favored subacute/chronic in etiology. No significant retropulsion or spinal canal  compromise. Correlate clinically to determine the need for additional imaging. Review of the MIP images confirms the above findings. IMPRESSION: 1. No embolism to the proximal subsegmental pulmonary artery level. 2. Bilateral moderate pleural effusions, right more than left with associated compressive atelectatic changes in the bilateral lower lobes. 3. Multiple other nonacute observations, as described above. Aortic Atherosclerosis (ICD10-I70.0). Electronically Signed   By: Ree Molt M.D.   On: 09/06/2023 14:27   DG Chest 2 View Result Date: 09/06/2023 CLINICAL DATA:  chest pain EXAM: CHEST - 2 VIEW COMPARISON:  None available. FINDINGS: Lower lung volumes. Bilateral perihilar interstitial opacities. Trace left pleural effusion with streaky left basilar atelectasis. No pneumothorax. No cardiomegaly. Tortuous aorta with aortic atherosclerosis. Osteopenia. Age-indeterminate mild compression fracture in the midthoracic spine multilevel thoracic osteophytosis. IMPRESSION: 1. Lower lung volumes. Bilateral perihilar interstitial opacities, may reflect bronchovascular crowding, interstitial edema, or atypical/viral infection. Trace left pleural effusion. 2. Age-indeterminate, mild compression fracture in the midthoracic spine. Correlation for point tenderness recommended to assess for acuity. Electronically Signed   By: Rogelia Myers M.D.   On: 09/06/2023 08:20     Procedures   Medications Ordered in the ED  furosemide  (LASIX ) injection 80 mg (80 mg Intravenous Given 09/06/23 1044)  iohexol  (OMNIPAQUE ) 350 MG/ML injection 75 mL (75 mLs Intravenous Contrast Given 09/06/23 1419)                                    Medical Decision Making Amount and/or Complexity of Data Reviewed Labs: ordered. Radiology: ordered.  Risk Prescription drug management. Decision regarding hospitalization.   This patient presents to the ED for concern of shortness of breath, this involves an extensive number of  treatment options, and is a complaint that carries with it a high risk of complications and morbidity.  I considered the following differential and admission for this acute, potentially life threatening condition.  The differential diagnosis includes CHF, PE, pneumonia, ACS, pneumothorax  MDM:    Patient is a 73 year old who presents with shortness of breath and orthopnea.  He has a little bit increased leg swelling.  He is afebrile.  His chest x-ray does not show any evidence of pneumonia.  There is some interstitial edema noted.  His BNP is markedly elevated.  His D-dimer was also elevated.  CT of the chest does not show any evidence of PE.  He was given dose of IV Lasix .  He is actually diuresed quite a bit and  his breathing has improved.  Discussed with Dr. Georgina who will admit the patient for further treatment.  (Labs, imaging, consults)  Labs: I Ordered, and personally interpreted labs.  The pertinent results include: Elevated BNP, mildly elevated troponin  Imaging Studies ordered: I ordered imaging studies including chest x-ray, CT chest I independently visualized and interpreted imaging. I agree with the radiologist interpretation  Additional history obtained from chart review.  External records from outside source obtained and reviewed including prior notes  Cardiac Monitoring: The patient was maintained on a cardiac monitor.  If on the cardiac monitor, I personally viewed and interpreted the cardiac monitored which showed an underlying rhythm of: Sinus tachycardia  Reevaluation: After the interventions noted above, I reevaluated the patient and found that they have :improved  Social Determinants of Health:    Disposition: Admit to hospital  Co morbidities that complicate the patient evaluation  Past Medical History:  Diagnosis Date   Diabetes mellitus without complication (HCC)    History of kidney stones      Medicines Meds ordered this encounter  Medications    furosemide  (LASIX ) injection 80 mg   iohexol  (OMNIPAQUE ) 350 MG/ML injection 75 mL    I have reviewed the patients home medicines and have made adjustments as needed  Problem List / ED Course: Problem List Items Addressed This Visit   None Visit Diagnoses       Acute congestive heart failure, unspecified heart failure type (HCC)    -  Primary   Relevant Medications   furosemide  (LASIX ) injection 80 mg (Completed)                Final diagnoses:  Acute congestive heart failure, unspecified heart failure type [facility])    ED Discharge Orders     None          Lenor Hollering, MD 09/06/23 1536

## 2023-09-06 NOTE — H&P (Signed)
 History and Physical    Patient: Joel Pitts FMW:[id number] DOB: 1950-09-16 DOA: 09/06/2023 DOS: the patient was seen and examined on 09/06/2023 PCP: Nanci Senior, MD  Patient coming from: Home  Chief Complaint:  Chief Complaint  Patient presents with   Chest Pain   Shortness of Breath   Fatigue   HPI: Joel Pitts is a 73 y.o. male with medical history significant of DM2 and renal stones p/w elevated BNP and pulmonary edema c/f ADHF.  Pt states that he was in his USOH until 2 weeks ago when he started to having increasing SOB that he initially attributed to the season change and increased pollen, even though he'd never had seasonal allergies. His SOB increased over the 2 weeks, along with fatigue and BLE edema to the point where he could no longer sleep lying flat. Ultimately, he was evaluated by his PCP yesterday and the decision was made for him to present to the ED for evaluation.   In the ED, pt tachycardic. Labs notable for Na 128, Cr 1.53, BNP 2251, and troponins 29-->25. D dimer 1.89 (elevated). CTA chest neg for PE, but did show b/l pleural effusions. EDP started IV lasix  80mg  x1 and admitted to medicine.   Review of Systems: As mentioned in the history of present illness. All other systems reviewed and are negative. Past Medical History:  Diagnosis Date   Diabetes mellitus without complication (HCC)    History of kidney stones    Past Surgical History:  Procedure Laterality Date   LITHOTRIPSY     Social History:  reports that he has never smoked. He has never used smokeless tobacco. He reports current alcohol use of about 7.0 standard drinks of alcohol per week. He reports that he does not use drugs.  No Known Allergies  No family history on file.  Prior to Admission medications   Medication Sig Start Date End Date Taking? Authorizing Provider  aspirin  81 MG tablet Take 81 mg by mouth daily.    [provider]  lisinopril  (PRINIVIL ,ZESTRIL ) 5 MG  tablet Take 5 mg by mouth every evening.     [provider]  metFORMIN (GLUCOPHAGE) 500 MG tablet Take 500 mg by mouth daily.     [provider]  Oxycodone  HCl 10 MG TABS Take 1 tablet (10 mg total) by mouth every 4 (four) hours as needed. 11/25/14   Ottelin, Mark, MD  tamsulosin  (FLOMAX ) 0.4 MG CAPS capsule Take 1 capsule (0.4 mg total) by mouth daily after supper. 11/25/14   Ottelin, Mark, MD  zolpidem (AMBIEN) 10 MG tablet Take 10 mg by mouth at bedtime as needed for sleep.    [provider]    Physical Exam: Vitals:   09/06/23 0945 09/06/23 1153 09/06/23 1245 09/06/23 1545  BP: 114/85  (!) 128/91 126/86  Pulse: (!) 107  (!) 108 (!) 106  Resp: 17  17   Temp:  97.7 F (36.5 C)    TempSrc:  Oral    SpO2: 99%  100% 99%  Weight:      Height:       General: Alert, oriented x3, resting comfortably in no acute distress Respiratory: Bibasilar rales; no wheezing Cardiovascular: Regular rate and rhythm w/o m/r/g   Data Reviewed:  Lab Results  Component Value Date   WBC 11.0 (H) 09/06/2023   HGB 17.9 (H) 09/06/2023   HCT 50.8 09/06/2023   MCV 93.2 09/06/2023   PLT 219 09/06/2023   Lab Results  Component  Value Date   GLUCOSE 243 (H) 09/06/2023   CALCIUM 9.3 09/06/2023   NA 128 (L) 09/06/2023   K 4.8 09/06/2023   CO2 19 (L) 09/06/2023   CL 97 (L) 09/06/2023   BUN 16 09/06/2023   CREATININE 1.53 (H) 09/06/2023   No results found for: ALT, AST, GGT, ALKPHOS, BILITOT No results found for: INR, PROTIME  Radiology: CT Angio Chest PE W/Cm &/Or Wo Cm Result Date: 09/06/2023 CLINICAL DATA:  Pulmonary embolism (PE) suspected, low to intermediate prob, positive D-dimer. Chest pain. Shortness of breath. EXAM: CT ANGIOGRAPHY CHEST WITH CONTRAST TECHNIQUE: Multidetector CT imaging of the chest was performed using the standard protocol during bolus administration of intravenous contrast. Multiplanar CT image reconstructions and MIPs were obtained to  evaluate the vascular anatomy. RADIATION DOSE REDUCTION: This exam was performed according to the departmental dose-optimization program which includes automated exposure control, adjustment of the mA and/or kV according to patient size and/or use of iterative reconstruction technique. CONTRAST:  75mL OMNIPAQUE  IOHEXOL  350 MG/ML SOLN COMPARISON:  None Available. FINDINGS: Cardiovascular: No evidence of embolism to the proximal subsegmental pulmonary artery level. Mild cardiomegaly. No pericardial effusion. No aortic aneurysm. There are coronary artery calcifications, in keeping with coronary artery disease. There are also mild peripheral atherosclerotic vascular calcifications of thoracic aorta and its major branches. Mediastinum/Nodes: Visualized thyroid gland appears grossly unremarkable. No solid / cystic mediastinal masses. The esophagus is nondistended precluding optimal assessment. No axillary, mediastinal or hilar lymphadenopathy by size criteria. Lungs/Pleura: The central tracheo-bronchial tree is patent. There are bilateral moderate pleural effusions, right more than left with associated compressive atelectatic changes in the bilateral lower lobes. There additional patchy areas of linear, plate-like atelectasis and/or scarring throughout bilateral lungs. No suspicious lung nodules. Upper Abdomen: Visualized upper abdominal viscera within normal limits. Musculoskeletal: The visualized soft tissues of the chest wall are grossly unremarkable. No suspicious osseous lesions. There are mild multilevel degenerative changes in the visualized spine. There is moderate anterior wedging deformity of T7 vertebral body, of indeterminate age but favored subacute/chronic in etiology. No significant retropulsion or spinal canal compromise. Correlate clinically to determine the need for additional imaging. Review of the MIP images confirms the above findings. IMPRESSION: 1. No embolism to the proximal subsegmental pulmonary  artery level. 2. Bilateral moderate pleural effusions, right more than left with associated compressive atelectatic changes in the bilateral lower lobes. 3. Multiple other nonacute observations, as described above. Aortic Atherosclerosis (ICD10-I70.0). Electronically Signed   By: Ree Molt M.D.   On: 09/06/2023 14:27   DG Chest 2 View Result Date: 09/06/2023 CLINICAL DATA:  chest pain EXAM: CHEST - 2 VIEW COMPARISON:  None available. FINDINGS: Lower lung volumes. Bilateral perihilar interstitial opacities. Trace left pleural effusion with streaky left basilar atelectasis. No pneumothorax. No cardiomegaly. Tortuous aorta with aortic atherosclerosis. Osteopenia. Age-indeterminate mild compression fracture in the midthoracic spine multilevel thoracic osteophytosis. IMPRESSION: 1. Lower lung volumes. Bilateral perihilar interstitial opacities, may reflect bronchovascular crowding, interstitial edema, or atypical/viral infection. Trace left pleural effusion. 2. Age-indeterminate, mild compression fracture in the midthoracic spine. Correlation for point tenderness recommended to assess for acuity. Electronically Signed   By: Rogelia Myers M.D.   On: 09/06/2023 08:20    Assessment and Plan: 79F h/o DM2 and renal stones p/w elevated BNP and b/l pleural effusions c/w ADHF.  Elevated BNP B/l pleural effusions Presumed ADHF -IV lasix  40mg  BID for now; goal net neg 1-2L/d; strict I/Os; daily standing weights; K>4/Mg>2 -F/u TTE -Will need GDMT prior  to d/c -OP Cards referral at discharge; consider IP ischemic eval if pt with reduced EF  HTN -PTA lisinopril  5mg  daily  BPH -PTA flomax    Advance Care Planning:   Code Status: Full Code   Consults: N/A  Family Communication: N/A  Severity of Illness: The appropriate patient status for this patient is INPATIENT. Inpatient status is judged to be reasonable and necessary in order to provide the required intensity of service to ensure the patient's  safety. The patient's presenting symptoms, physical exam findings, and initial radiographic and laboratory data in the context of their chronic comorbidities is felt to place them at high risk for further clinical deterioration. Furthermore, it is not anticipated that the patient will be medically stable for discharge from the hospital within 2 midnights of admission.   * I certify that at the point of admission it is my clinical judgment that the patient will require inpatient hospital care spanning beyond 2 midnights from the point of admission due to high intensity of service, high risk for further deterioration and high frequency of surveillance required.*   ------- I spent 55 minutes reviewing previous labs/notes, obtaining separate history at the bedside, counseling/discussing the treatment plan outlined above, ordering medications/tests, and performing clinical documentation.  Author: Marsha Ada, MD 09/06/2023 4:29 PM  For on call review [email or web address].

## 2023-09-07 ENCOUNTER — Inpatient Hospital Stay (HOSPITAL_COMMUNITY)

## 2023-09-07 DIAGNOSIS — N179 Acute kidney failure, unspecified: Secondary | ICD-10-CM | POA: Diagnosis not present

## 2023-09-07 DIAGNOSIS — I5021 Acute systolic (congestive) heart failure: Secondary | ICD-10-CM

## 2023-09-07 DIAGNOSIS — R7989 Other specified abnormal findings of blood chemistry: Secondary | ICD-10-CM

## 2023-09-07 DIAGNOSIS — E1122 Type 2 diabetes mellitus with diabetic chronic kidney disease: Secondary | ICD-10-CM

## 2023-09-07 DIAGNOSIS — N400 Enlarged prostate without lower urinary tract symptoms: Secondary | ICD-10-CM

## 2023-09-07 DIAGNOSIS — I509 Heart failure, unspecified: Secondary | ICD-10-CM | POA: Diagnosis not present

## 2023-09-07 DIAGNOSIS — I10 Essential (primary) hypertension: Secondary | ICD-10-CM

## 2023-09-07 DIAGNOSIS — E119 Type 2 diabetes mellitus without complications: Secondary | ICD-10-CM

## 2023-09-07 LAB — BASIC METABOLIC PANEL WITH GFR
Anion gap: 10 (ref 5–15)
BUN: 17 mg/dL (ref 8–23)
CO2: 23 mmol/L (ref 22–32)
Calcium: 8.6 mg/dL — ABNORMAL LOW (ref 8.9–10.3)
Chloride: 98 mmol/L (ref 98–111)
Creatinine, Ser: 1.41 mg/dL — ABNORMAL HIGH (ref 0.61–1.24)
GFR, Estimated: 52 mL/min — ABNORMAL LOW (ref >60)
Glucose, Bld: 145 mg/dL — ABNORMAL HIGH (ref 70–99)
Potassium: 4 mmol/L (ref 3.5–5.1)
Sodium: 131 mmol/L — ABNORMAL LOW (ref 135–145)

## 2023-09-07 LAB — ECHOCARDIOGRAM COMPLETE BUBBLE STUDY
AR max vel: 2.49 cm2
AV Area VTI: 2.37 cm2
AV Area mean vel: 2.32 cm2
AV Mean grad: 2 mmHg
AV Peak grad: 3.1 mmHg
Ao pk vel: 0.88 m/s
Area-P 1/2: 8.72 cm2
Calc EF: 22.9 %
Est EF: <20
S' Lateral: 4.2 cm
Single Plane A2C EF: 13 %
Single Plane A4C EF: 25.9 %

## 2023-09-07 MED ORDER — PERFLUTREN LIPID MICROSPHERE
1.0000 mL | INTRAVENOUS | Status: AC | PRN
  Administered 2023-09-07: 2 mL via INTRAVENOUS

## 2023-09-07 MED ORDER — FUROSEMIDE 10 MG/ML IJ SOLN
40.0000 mg | Freq: Once | INTRAMUSCULAR | Status: AC
  Administered 2023-09-07: 40 mg via INTRAVENOUS
  Filled 2023-09-07: qty 4

## 2023-09-07 MED ORDER — EMPAGLIFLOZIN 10 MG PO TABS
10.0000 mg | ORAL_TABLET | Freq: Every day | ORAL | Status: DC
  Administered 2023-09-08 – 2023-09-11 (×4): 10 mg via ORAL
  Filled 2023-09-07 (×5): qty 1

## 2023-09-07 MED ORDER — LOSARTAN POTASSIUM 25 MG PO TABS
25.0000 mg | ORAL_TABLET | Freq: Every day | ORAL | Status: DC
  Administered 2023-09-08 – 2023-09-11 (×3): 25 mg via ORAL
  Filled 2023-09-07 (×4): qty 1

## 2023-09-07 NOTE — Hospital Course (Addendum)
 Joel Pitts was admitted to the hospital with the working diagnosis of heart failure exacerbation.   73 yo male with the past medical history of T2DM, and nephrolithiasis who presented with dyspnea, lower extremity edema, and orthopnea for the last 2  weeks. He was evaluated by his primary care 24 hrs prior to admission, he was found volume overloaded and recommended to be evaluated in the ED. On his initial physical examination his blood pressure 114/85, HR 107, RR 17 and 02 saturation 99%, lungs with no wheezing, positive bilateral rales, heart with S1 and S2 present and regular, with no gallops, rubs or murmurs, abdomen with no distention and positive lower extremity edema   Na 128, K 4.8 Cl 97 bicarbonate 19 glucose 243, bun 16 cr 1,53  BNP 2,251 High sensitive troponin 29 and 25  Wbc 11.0 hgb 17,9 plt 219   D dimer 1.89   Chest radiograph with cardiomegaly, bilateral hilar vascular congestion, bilateral central interstitial infiltrates, bilateral pleural effusions, fluid in the right fissure. Age indeterminate mild compression fracture in the midthoracic spine.   CT chest with bilateral ground glass opacities with bilateral pleural effusions.  No embolism to the proximal subsegmental pulmonary artery level.  Moderate anterior wedging deformity of T7 vertebral body, of indeterminate age, but favored subacute to chronic in etiology.   EKG 111 bpm, left axis deviation, qtc 462, sinus rhythm with q wave V1 to V3, no significant ST segment changes, negative T wave lead I and aVL.   Patient was placed on furosemide  IV for diuresis.  Echocardiogram with reduced LV systolic function.

## 2023-09-07 NOTE — Assessment & Plan Note (Signed)
 Continue blood pressure control with losartan , possible transition to entresto

## 2023-09-07 NOTE — Assessment & Plan Note (Signed)
 Continue glucose cover and monitoring with insulin sliding scale Hyperglycemia   Continue SGLT 2 inh, will resume metformin at the time of discharge.

## 2023-09-07 NOTE — Assessment & Plan Note (Signed)
 Hyponatremia (possible CKD stage 3a)  Renal function stable with serum cr at 1,51 with K at 4,4 and serum bicarbonate at 20  Na 134.  Continue diuresis with furosemide , spironolactone  and SGLT 2 inh Follow up renal function and electrolytes in am.  Avoid hypotension and nephrotoxic medications

## 2023-09-07 NOTE — Assessment & Plan Note (Signed)
 Volume status is improving, pending echocardiogram report.   Plan to continue diuresis with furosemide  40 mg IV bid Change lisinopril  to losartan , possible transition to entresto Add SGLT 2 inh.  Follow up on response to diuresis.

## 2023-09-07 NOTE — Assessment & Plan Note (Signed)
 Continue with flomax , no signs of acute urinary retention.

## 2023-09-07 NOTE — Progress Notes (Signed)
 Progress Note   Patient: Joel Pitts FMW:[id number] DOB: [birth year] DOA: 09/06/2023     1 DOS: the patient was seen and examined on 09/07/2023   Brief hospital course: Joel Pitts was admitted to the hospital with the working diagnosis of heart failure exacerbation.   73 yo male with the past medical history of T2DM, and nephrolithiasis who presented with dyspnea, lower extremity edema, and orthopnea for the last 2  weeks. He was evaluated by his primary care 24 hrs prior to admission, he was found volume overloaded and recommended to be evaluated in the ED. On his initial physical examination his blood pressure 114/85, HR 107, RR 17 and 02 saturation 99%, lungs with no wheezing, positive bilateral rales, heart with S1 and S2 present and regular, with no gallops, rubs or murmurs, abdomen with no distention and positive lower extremity edema   Na 128, K 4.8 Cl 97 bicarbonate 19 glucose 243, bun 16 cr 1,53  BNP 2,251 High sensitive troponin 29 and 25  Wbc 11.0 hgb 17,9 plt 219   D dimer 1.89   Chest radiograph with cardiomegaly, bilateral hilar vascular congestion, bilateral central interstitial infiltrates, bilateral pleural effusions, fluid in the right fissure. Age indeterminate mild compression fracture in the midthoracic spine.   CT chest with bilateral ground glass opacities with bilateral pleural effusions.  No embolism to the proximal subsegmental pulmonary artery level.  Moderate anterior wedging deformity of T7 vertebral body, of indeterminate age, but favored subacute to chronic in etiology.   EKG 111 bpm, left axis deviation, qtc 462, sinus rhythm with q wave V1 to V3, no significant ST segment changes, negative T wave lead I and aVL.   Assessment and Plan: * Acute on chronic heart failure (HCC) Volume status is improving, pending echocardiogram report.   Plan to continue diuresis with furosemide  40 mg IV bid Change lisinopril  to losartan , possible transition to  entresto Add SGLT 2 inh.  Follow up on response to diuresis.    AKI (acute kidney injury) (HCC) Hyponatremia   Continue diuresis with furosemide , follow up renal function and electrolytes in am.  Avoid hypotension and nephrotoxic medications   Essential hypertension Continue blood pressure control with losartan , possible transition to entresto   Type 2 diabetes mellitus (HCC) Continue glucose cover and monitoring with insulin sliding scale Hyperglycemia   BPH (benign prostatic hyperplasia) Continue with flomax , no signs of acute urinary retention.       Subjective: Patient with improvement in dyspnea, and orthopnea, not yet back to baseline, no chest pain.   Physical Exam: Vitals:   09/07/23 0116 09/07/23 0549 09/07/23 0730 09/07/23 0816  BP: 112/73 (!) 85/57 106/66   Pulse: 91 94 100   Resp: 18 17 (!) 25   Temp: 97.8 F (36.6 C) 97.8 F (36.6 C)  98.4 F (36.9 C)  TempSrc:    Oral  SpO2: 99% 97% 98%   Weight:      Height:       Neurology awake and alert ENT with mild pallor Cardiovascular with S1 and S2 present and regular with no gallops, rubs or murmurs Mild JVD Respiratory with mild rales at bases with decreased breath sounds at lower zones. Abdomen with no distention  No lower extremity edema   Data Reviewed:    Family Communication: no family at the bedside   Disposition: Status is: Inpatient Remains inpatient appropriate because: IV diuresis   Planned Discharge Destination: Home     Author: Elidia Toribio Furnace, MD 09/07/2023  10:01 AM  For on call review [email or web address].

## 2023-09-08 ENCOUNTER — Telehealth (HOSPITAL_COMMUNITY): Payer: Self-pay | Admitting: Pharmacy Technician

## 2023-09-08 ENCOUNTER — Other Ambulatory Visit (HOSPITAL_COMMUNITY): Payer: Self-pay

## 2023-09-08 DIAGNOSIS — I5023 Acute on chronic systolic (congestive) heart failure: Secondary | ICD-10-CM | POA: Diagnosis not present

## 2023-09-08 DIAGNOSIS — I10 Essential (primary) hypertension: Secondary | ICD-10-CM | POA: Diagnosis not present

## 2023-09-08 DIAGNOSIS — N179 Acute kidney failure, unspecified: Secondary | ICD-10-CM | POA: Diagnosis not present

## 2023-09-08 DIAGNOSIS — E1122 Type 2 diabetes mellitus with diabetic chronic kidney disease: Secondary | ICD-10-CM | POA: Diagnosis not present

## 2023-09-08 DIAGNOSIS — E43 Unspecified severe protein-calorie malnutrition: Secondary | ICD-10-CM | POA: Insufficient documentation

## 2023-09-08 LAB — BASIC METABOLIC PANEL WITH GFR
Anion gap: 14 (ref 5–15)
BUN: 19 mg/dL (ref 8–23)
CO2: 20 mmol/L — ABNORMAL LOW (ref 22–32)
Calcium: 8.6 mg/dL — ABNORMAL LOW (ref 8.9–10.3)
Chloride: 100 mmol/L (ref 98–111)
Creatinine, Ser: 1.51 mg/dL — ABNORMAL HIGH (ref 0.61–1.24)
GFR, Estimated: 48 mL/min — ABNORMAL LOW (ref >60)
Glucose, Bld: 149 mg/dL — ABNORMAL HIGH (ref 70–99)
Potassium: 4.4 mmol/L (ref 3.5–5.1)
Sodium: 134 mmol/L — ABNORMAL LOW (ref 135–145)

## 2023-09-08 LAB — LIPID PANEL
Cholesterol: 160 mg/dL (ref 0–200)
HDL: 46 mg/dL (ref >40)
LDL Cholesterol: 88 mg/dL (ref 0–99)
Total CHOL/HDL Ratio: 3.5 ratio
Triglycerides: 132 mg/dL (ref <150)
VLDL: 26 mg/dL (ref 0–40)

## 2023-09-08 LAB — HEPARIN LEVEL (UNFRACTIONATED): Heparin Unfractionated: <0.1 [IU]/mL — ABNORMAL LOW (ref 0.30–0.70)

## 2023-09-08 LAB — MAGNESIUM: Magnesium: 2 mg/dL (ref 1.7–2.4)

## 2023-09-08 MED ORDER — SPIRONOLACTONE 12.5 MG HALF TABLET
12.5000 mg | ORAL_TABLET | Freq: Every day | ORAL | Status: DC
  Administered 2023-09-08: 12.5 mg via ORAL
  Filled 2023-09-08: qty 1

## 2023-09-08 MED ORDER — ADULT MULTIVITAMIN W/MINERALS CH
1.0000 | ORAL_TABLET | Freq: Every day | ORAL | Status: DC
  Administered 2023-09-08 – 2023-09-11 (×4): 1 via ORAL
  Filled 2023-09-08 (×4): qty 1

## 2023-09-08 MED ORDER — FUROSEMIDE 10 MG/ML IJ SOLN: 40.0000 mg | Freq: Two times a day (BID) | INTRAMUSCULAR | Status: DC

## 2023-09-08 MED ORDER — FUROSEMIDE 10 MG/ML IJ SOLN: 40.0000 mg | Freq: Once | INTRAMUSCULAR | Status: DC

## 2023-09-08 MED ORDER — ASPIRIN 81 MG PO TBEC
81.0000 mg | DELAYED_RELEASE_TABLET | Freq: Every day | ORAL | Status: DC
  Administered 2023-09-10 – 2023-09-11 (×2): 81 mg via ORAL
  Filled 2023-09-08 (×2): qty 1

## 2023-09-08 MED ORDER — HEPARIN BOLUS VIA INFUSION
2000.0000 [IU] | Freq: Once | INTRAVENOUS | Status: AC
  Administered 2023-09-08: 2000 [IU] via INTRAVENOUS
  Filled 2023-09-08: qty 2000

## 2023-09-08 MED ORDER — FUROSEMIDE 10 MG/ML IJ SOLN
40.0000 mg | Freq: Two times a day (BID) | INTRAMUSCULAR | Status: DC
  Administered 2023-09-08 (×2): 40 mg via INTRAVENOUS
  Filled 2023-09-08 (×2): qty 4

## 2023-09-08 MED ORDER — NEPRO/CARBSTEADY PO LIQD
237.0000 mL | Freq: Every day | ORAL | Status: DC
  Administered 2023-09-08 – 2023-09-11 (×3): 237 mL via ORAL

## 2023-09-08 MED ORDER — HEPARIN (PORCINE) 25000 UT/250ML-% IV SOLN
1050.0000 [IU]/h | INTRAVENOUS | Status: DC
  Administered 2023-09-08: 850 [IU]/h via INTRAVENOUS
  Administered 2023-09-09: 1050 [IU]/h via INTRAVENOUS
  Filled 2023-09-08 (×2): qty 250

## 2023-09-08 MED ORDER — HEPARIN BOLUS VIA INFUSION
4000.0000 [IU] | Freq: Once | INTRAVENOUS | Status: AC
  Administered 2023-09-08: 4000 [IU] via INTRAVENOUS
  Filled 2023-09-08: qty 4000

## 2023-09-08 NOTE — Telephone Encounter (Addendum)
 Patient Product/process development scientist completed.    The patient is insured through [facility]. Patient has [employer], may use a copay card, and/or apply for patient assistance if available.    Ran test claim for Entresto 24-26 mg and the current 30 day co-pay is $341.90.  Ran test claim for Jardiance  10 mg and the current 30 day co-pay is $183.11.  Ran test claim for Eliquis 5 mg and the current 30 day co-pay is $176.39.  Ran test claim for Xarelto 20 mg and the current 30 day co-pay is $173.99.  Ran test claim for Farxiga 10 mg and Product Not Covered  This test claim was processed through [employer]- copay amounts may vary at other pharmacies due to [employer], or as the patient moves through the different stages of their insurance plan.     Reyes Sharps, CPHT Pharmacy Technician III Certified Patient Advocate [facility] Patient Advocate Team Direct Number: [phone]  Fax: [phone]

## 2023-09-08 NOTE — Progress Notes (Signed)
 PHARMACY - ANTICOAGULATION CONSULT NOTE  Pharmacy Consult for heparin  Indication: Echo showing apical clot  Allergies  Allergen Reactions   Statins Diarrhea    Patient Measurements: Height: 5' 6 (167.6 cm) Weight: 63.5 kg (140 lb) IBW/kg (Calculated) : 63.8 HEPARIN  DW (KG): 63.5  Vital Signs: Temp: 98.2 F (36.8 C) (07/10 2010) Temp Source: Oral (07/10 2010) BP: 91/63 (07/10 2010) Pulse Rate: 103 (07/10 2010)  Labs: Recent Labs    09/06/23 0746 09/06/23 1007 09/07/23 0232 09/08/23 0421  HGB 17.9*  --   --   --   HCT 50.8  --   --   --   PLT 219  --   --   --   CREATININE 1.53*  --  1.41* 1.51*  TROPONINIHS 29* 25*  --   --     Estimated Creatinine Clearance: 38.5 mL/min (A) (by C-G formula based on SCr of 1.51 mg/dL (H)).   Assessment: 73 yo male with HF and apical clot on ECHO. Pharmacy consulted to dose IV heparin .   Heparin  level is undetectable.  No issue with heparin  infusion nor bleeding per discussion from RN.  Goal of Therapy:  Heparin  level 0.3-0.7 units/ml Monitor platelets by anticoagulation protocol: Yes   Plan:  Heparin  2000 units IV bolus x 1, then Increase heparin  gtt to 1050 units/hr Check 8 hr heparin  level  Joel Pitts D. [place], PharmD, BCPS, BCCCP 09/08/2023, 10:36 PM

## 2023-09-08 NOTE — Progress Notes (Signed)
 PHARMACY - ANTICOAGULATION CONSULT NOTE  Pharmacy Consult for heparin  Indication: Echo showing apical clot  Allergies  Allergen Reactions   Statins Diarrhea    Patient Measurements: Height: 5' 6 (167.6 cm) Weight: 63.5 kg (140 lb) IBW/kg (Calculated) : 63.8 HEPARIN  DW (KG): 63.5  Vital Signs: Temp: 97.6 F (36.4 C) (07/10 1211) Temp Source: Oral (07/10 1211) BP: 108/81 (07/10 1211) Pulse Rate: 97 (07/10 1237)  Labs: Recent Labs    09/06/23 0746 09/06/23 1007 09/07/23 0232 09/08/23 0421  HGB 17.9*  --   --   --   HCT 50.8  --   --   --   PLT 219  --   --   --   CREATININE 1.53*  --  1.41* 1.51*  TROPONINIHS 29* 25*  --   --     Estimated Creatinine Clearance: 38.5 mL/min (A) (by C-G formula based on SCr of 1.51 mg/dL (H)).   Medical History: Past Medical History:  Diagnosis Date   Diabetes mellitus without complication (HCC)    History of kidney stones     Medications:  Medications Prior to Admission  Medication Sig Dispense Refill Last Dose/Taking   aspirin  81 MG tablet Take 81 mg by mouth daily.   09/05/2023 Morning   lisinopril  (PRINIVIL ,ZESTRIL ) 5 MG tablet Take 5 mg by mouth every evening.    09/05/2023   metFORMIN (GLUCOPHAGE) 500 MG tablet Take 500 mg by mouth 2 (two) times daily with a meal.   09/05/2023   silodosin (RAPAFLO) 8 MG CAPS capsule Take 8 mg by mouth daily.   09/05/2023   zolpidem (AMBIEN) 10 MG tablet Take 10 mg by mouth at bedtime.   09/04/2023   Scheduled:   aspirin   81 mg Oral Daily   empagliflozin   10 mg Oral Daily   feeding supplement (NEPRO CARB STEADY)  237 mL Oral Daily   furosemide   40 mg Intravenous BID   losartan   25 mg Oral Daily   multivitamin with minerals  1 tablet Oral Daily   spironolactone   12.5 mg Oral Daily   tamsulosin   0.4 mg Oral PC supper    Assessment: 73 yo male with HF and Echo shows an apical clot. Pharmacy consulted to dose IV heparin . He was on on anticoagulation PTA.  -Hg= 17.9, plt= 219   Goal of  Therapy:  Heparin  level 0.3-0.7 units/ml Monitor platelets by anticoagulation protocol: Yes   Plan:  -Heparin  4000 units x1 then begin 850 units/hr -Heparin  level in 8 hours and daily wth CBC daily  Prentice Poisson, PharmD Clinical Pharmacist **Pharmacist phone directory can now be found on [email or web address] ([email or web address]).  Listed under [facility].

## 2023-09-08 NOTE — Progress Notes (Signed)
 Heart Failure Navigator Progress Note  Assessed for Heart & Vascular TOC clinic readiness.  Patient does not meet criteria due to Advanced Heart Failure Team consulted per Dr. Arrien. No HF TOC. .   Navigator will sign off at this time.   Stephane Haddock, BSN, Scientist, clinical (histocompatibility and immunogenetics) Only

## 2023-09-08 NOTE — Progress Notes (Signed)
   09/08/23 2010  Assess: MEWS Score  Temp 98.2 F (36.8 C)  BP 91/63  MAP (mmHg) 71  Pulse Rate (!) 103  ECG Heart Rate (!) 104  Resp 20  Level of Consciousness Alert  SpO2 94 %  O2 Device Room Air  Assess: MEWS Score  MEWS Temp 0  MEWS Systolic 1  MEWS Pulse 1  MEWS RR 0  MEWS LOC 0  MEWS Score 2  MEWS Score Color Yellow  Assess: if the MEWS score is Yellow or Red  Were vital signs accurate and taken at a resting state? Yes  Does the patient meet 2 or more of the SIRS criteria? No  MEWS guidelines implemented  Yes, yellow  Treat  MEWS Interventions Considered administering scheduled or prn medications/treatments as ordered  Take Vital Signs  Increase Vital Sign Frequency  Yellow: Q2hr x1, continue Q4hrs until patient remains green for 12hrs  Escalate  MEWS: Escalate Yellow: Discuss with charge nurse and consider notifying provider and/or RRT  Notify: Charge Nurse/RN  Name of Charge Nurse/RN Best boy, RN  Assess: SIRS CRITERIA  SIRS Temperature  0  SIRS Respirations  0  SIRS Pulse 1  SIRS WBC 0  SIRS Score Sum  1

## 2023-09-08 NOTE — Progress Notes (Signed)
 Initial Nutrition Assessment  DOCUMENTATION CODES:   Severe malnutrition in context of acute illness/injury  INTERVENTION:  -Continue on heart healthy/carb modified diet order -Add Snack BID -Add Nepro daily for concentrated kcal/pro -Add MVI w/min -Education given on malnutrition, heart healthy diet, fluid restriction -Continue education prn   NUTRITION DIAGNOSIS:   Severe Malnutrition related to catabolic illness, chronic illness, acute illness as evidenced by mild fat depletion, severe muscle depletion, estimated needs.   GOAL:   Patient will meet greater than or equal to 90% of their needs   MONITOR:   PO intake, Supplement acceptance, Labs, Weight trends, Skin, I & O's  REASON FOR ASSESSMENT:   Consult Diet education  ASSESSMENT:   Admit w/ acute on chronic CHF, fluid overload, DM2, HTN. Came to ED after 2 wks increasing fatigue, edema, SOB.  Spoke to pt at bedside. Pt denies n/v/c/d or chewing/swallowing difficulties. Last BM 7/8. Pt states appetite and food intake has been in usual range. No documented intakes to assess. Pt states his UBW ~150s, current weight 140 lbs. Pt does endorses weight loss without trying over the last couple months, then has been gaining desirably per pt from low of 130 lbs. Suspect some weight gain r/t fluid accumulation. No edema present on exam, though, pt states respiratory symptoms usually happen even without edema in extremities. Education given regarding heart healthy/fluid restriction/low sodium diet pattern. Discussed importance of trying to maintain muscle mass. Pt doesn't enjoy protein shakes, though, states he will drink them. Will add Nepro qday for concentrated kcal/pro intake as well as additional snacks BID between meals. NFPE completed (see below). Pt does meet criteria for severe PCM in the context of acute illness. Will add MVI w/ min to support adequate intake. Pt denies additional questions/concerns at this time, will continue  to monitor, RDN available prn.   NUTRITION - FOCUSED PHYSICAL EXAM:  Flowsheet Row Most Recent Value  Orbital Region Moderate depletion  Upper Arm Region Mild depletion  Thoracic and Lumbar Region Mild depletion  Buccal Region Moderate depletion  Temple Region Severe depletion  Clavicle Bone Region Moderate depletion  Clavicle and Acromion Bone Region Severe depletion  Scapular Bone Region Mild depletion  Dorsal Hand Moderate depletion  Patellar Region Moderate depletion  Anterior Thigh Region Severe depletion  Posterior Calf Region Mild depletion  Edema (RD Assessment) None  Hair Reviewed  Eyes Reviewed  Mouth Reviewed  Skin Reviewed  Nails Reviewed    Diet Order:   Diet Order             Diet heart healthy/carb modified Fluid consistency: Thin; Fluid restriction: 1200 mL Fluid  Diet effective now                   EDUCATION NEEDS:   Education needs have been addressed  Skin:  Skin Assessment: Reviewed RN Assessment  Last BM:  7/8  Height:   Ht Readings from Last 1 Encounters:  09/07/23 5' 6 (1.676 m)    Weight:   Wt Readings from Last 1 Encounters:  09/07/23 63.5 kg    BMI:  Body mass index is 22.6 kg/m.  Estimated Nutritional Needs:   Kcal:  1900-2225 kcal  Protein:  75-95 g  Fluid:  1200 ml fluid restriction    Joel Pitts, RDN, LDN Registered Dietitian Nutritionist RD Inpatient Contact Info in [place]

## 2023-09-08 NOTE — Consult Note (Addendum)
 Advanced Heart Failure Team Consult Note   Primary Physician: Nanci Senior, MD Cardiologist:  None  Reason for Consultation: acute systolic heart failure  HPI:    LEIB ELAHI is seen today for evaluation of acute systolic heart failure at the request of Dr. Arrien, hospital medicine.   KEJON FEILD is a 73 y.o. male with DMII and nephrolithiasis. He presented to the ED Tuesday after being evaluated by his PCP who recommended he present to the ED.  His PCP noted EKG to be abnormal.  Mr. Gorka reports 2 weeks of worsening SOB, orthopnea, chest pressure and BLE edema.  Had not experience any of this prior. He follows regularly with PCP, 2 times a year.  Denies heavy EtOH use (drinks 1 beer almost every night), smoking, illicit drug use.  Eats out almost daily it is easier for him.  Reports family history of dad having open heart surgery (now deceased) and mom with what sounds like CHF, also deceased. Reports no chest pain, he would just feel pressure when he laid down or with prolonged exertion.   Admission labs reviewed: K 4.8, SCr 1.53, HsTrop 29>25. BNP 2.2K. D dimer 1.89, CT chest negative for PE, pleural effusions seen. Echo showed EF <20%, possible layered thrombus, LV with GHK, GIIDD, RV mildly reduced, LA mod reduced, trivial MR. Has diuresed ~7lbs with IV Lasix .   Now denies CP or SOB.   Cardiac studies reviewed:  Echo 09/07/23: EF <20%, possible layered thrombus, LV with GHK, GIIDD, RV mildly reduced, LA mod reduced, trivial MR Rest Stress Myoview  10' normal.   Home Medications Prior to Admission medications   Medication Sig Start Date End Date Taking? Authorizing Provider  aspirin  81 MG tablet Take 81 mg by mouth daily.   Yes [provider]  lisinopril  (PRINIVIL ,ZESTRIL ) 5 MG tablet Take 5 mg by mouth every evening.    Yes [provider]  metFORMIN (GLUCOPHAGE) 500 MG tablet Take 500 mg by mouth 2 (two) times daily with a meal.   Yes [provider]  silodosin (RAPAFLO) 8 MG CAPS capsule Take 8 mg by mouth daily. 08/29/23  Yes [provider]  zolpidem (AMBIEN) 10 MG tablet Take 10 mg by mouth at bedtime.    [provider]    Past Medical History: Past Medical History:  Diagnosis Date   Diabetes mellitus without complication (HCC)    History of kidney stones     Past Surgical History: Past Surgical History:  Procedure Laterality Date   LITHOTRIPSY      Family History: History reviewed. No pertinent family history.  Social History: Social History   Socioeconomic History   Marital status: Married    Spouse name: Not on file   Number of children: Not on file   Years of education: Not on file   Highest education level: Not on file  Occupational History   Not on file  Tobacco Use   Smoking status: Never   Smokeless tobacco: Never  Substance and Sexual Activity   Alcohol use: Yes    Alcohol/week: 7.0 standard drinks of alcohol    Types: 7 Cans of beer per week   Drug use: No   Sexual activity: Not on file  Other Topics Concern   Not on file  Social History Narrative   Not on file   Social Drivers of Health   Financial Resource Strain: Not on file  Food Insecurity: No Food Insecurity (09/07/2023)   Hunger Vital  Sign    Worried About Programme researcher, broadcasting/film/video in the Last Year: Never true    Ran Out of Food in the Last Year: Never true  Transportation Needs: No Transportation Needs (09/07/2023)   PRAPARE - Administrator, Civil Service (Medical): No    Lack of Transportation (Non-Medical): No  Physical Activity: Not on file  Stress: Not on file  Social Connections: Unknown (09/07/2023)   Social Connection and Isolation Panel    Frequency of Communication with Friends and Family: Once a week    Frequency of Social Gatherings with Friends and Family: Once a week    Attends Religious Services: 1 to 4 times per year    Active Member of [employer] or Organizations: Yes    Attends Occupational hygienist Meetings: 1 to 4 times per year    Marital Status: Patient declined   Allergies:  Allergies  Allergen Reactions   Statins Diarrhea    Objective:    Vital Signs:   Temp:  [97.5 F (36.4 C)-97.8 F (36.6 C)] 97.8 F (36.6 C) (07/10 0810) Pulse Rate:  [95-109] 109 (07/10 0810) Resp:  [16-18] 18 (07/10 0810) BP: (102-116)/(73-82) 116/82 (07/10 0810) SpO2:  [92 %-98 %] 96 % (07/10 0810) Weight:  [63.5 kg] 63.5 kg (07/09 1135) Last BM Date : 09/06/23  Weight change: Filed Weights   09/06/23 0743 09/07/23 1135  Weight: 66.7 kg 63.5 kg    Intake/Output:   Intake/Output Summary (Last 24 hours) at 09/08/2023 1131 Last data filed at 09/08/2023 1100 Gross per 24 hour  Intake 118 ml  Output 600 ml  Net -482 ml    Physical Exam  General:  well appearing.  No respiratory difficulty Neck: supple. JVD ~7 cm.  Cor: PMI nondisplaced. Regular rate & rhythm. No rubs, gallops or murmurs. Lungs: clear Extremities: no cyanosis, clubbing, rash, edema  Neuro: alert & oriented x 3. Moves all 4 extremities w/o difficulty. Affect pleasant.   Telemetry   ST 110s (Personally reviewed)    EKG    ST 111 bpm  Labs   Basic Metabolic Panel: Recent Labs  Lab 09/06/23 0746 09/07/23 0232 09/08/23 0421  NA 128* 131* 134*  K 4.8 4.0 4.4  CL 97* 98 100  CO2 19* 23 20*  GLUCOSE 243* 145* 149*  BUN 16 17 19   CREATININE 1.53* 1.41* 1.51*  CALCIUM 9.3 8.6* 8.6*  MG  --   --  2.0    Liver Function Tests: No results for input(s): AST, ALT, ALKPHOS, BILITOT, PROT, ALBUMIN in the last 168 hours. No results for input(s): LIPASE, AMYLASE in the last 168 hours. No results for input(s): AMMONIA in the last 168 hours.  CBC: Recent Labs  Lab 09/06/23 0746  WBC 11.0*  HGB 17.9*  HCT 50.8  MCV 93.2  PLT 219    Cardiac Enzymes: No results for input(s): CKTOTAL, CKMB, CKMBINDEX, TROPONINI in the last 168 hours.  BNP: BNP (last 3  results) Recent Labs    09/06/23 0856  BNP 2,251.1*    ProBNP (last 3 results) No results for input(s): PROBNP in the last 8760 hours.   CBG: No results for input(s): GLUCAP in the last 168 hours.  Coagulation Studies: No results for input(s): LABPROT, INR in the last 72 hours.   Imaging   ECHOCARDIOGRAM COMPLETE BUBBLE STUDY Result Date: 09/07/2023    ECHOCARDIOGRAM REPORT   Patient Name:   WILIAN KWONG Date of Exam: 09/07/2023 Medical Rec #:  [id number]        Height:       66.0 in Accession #:    [id number]       Weight:       147.0 lb Date of Birth:  1950-05-22        BSA:          1.755 m Patient Age:    73 years         BP:           114/89 mmHg Patient Gender: M                HR:           102 bpm. Exam Location:  Inpatient Procedure: 2D Echo, Color Doppler, Cardiac Doppler, Saline Contrast Bubble Study            and Intracardiac Opacification Agent (Both Spectral and Color Flow            Doppler were utilized during procedure). Indications:    Elevated BNP  History:        Patient has no prior history of Echocardiogram examinations.                 CHF; Risk Factors:Diabetes and Hypertension.  Sonographer:    Christiana Mbomeh Referring Phys: [id number] MARSHA ADA IMPRESSIONS  1. 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  2. Left ventricular ejection fraction, by estimation, is <20%. The left ventricle has severely decreased function. The left ventricle demonstrates global hypokinesis. Left ventricular diastolic parameters are consistent with Grade II diastolic dysfunction (pseudonormalization). Elevated left atrial pressure.  3. Right ventricular systolic function is mildly reduced. The right ventricular size is normal.  4. Left atrial size was moderately dilated.  5. The mitral valve is normal in structure. Trivial mitral valve regurgitation. No evidence of mitral stenosis.  6. The aortic valve is  tricuspid. Aortic valve regurgitation is not visualized. No aortic stenosis is present.  7. The inferior vena cava is normal in size with greater than 50% respiratory variability, suggesting right atrial pressure of 3 mmHg.  8. Agitated saline contrast bubble study was negative, with no evidence of any interatrial shunt. Comparison(s): No prior Echocardiogram. FINDINGS  Left Ventricle: Left ventricular ejection fraction, by estimation, is <20%. The left ventricle has severely decreased function. The left ventricle demonstrates global hypokinesis. Definity  contrast agent was given IV to delineate the left ventricular endocardial borders. The left ventricular internal cavity size was normal in size. There is no left ventricular hypertrophy. Left ventricular diastolic parameters are consistent with Grade II diastolic dysfunction (pseudonormalization). Elevated left atrial pressure. Right Ventricle: The right ventricular size is normal. Right ventricular systolic function is mildly reduced. Left Atrium: Left atrial size was moderately dilated. Right Atrium: Right atrial size was normal in size. Pericardium: There is no evidence of pericardial effusion. Mitral Valve: The mitral valve is normal in structure. Mild mitral annular calcification. Trivial mitral valve regurgitation. No evidence of mitral valve stenosis. Tricuspid Valve: The tricuspid valve is normal in structure. Tricuspid valve regurgitation is trivial. No evidence of tricuspid stenosis. Aortic Valve: The aortic valve is tricuspid. Aortic valve regurgitation is not visualized. No aortic stenosis is present. Aortic valve mean gradient measures 2.0 mmHg. Aortic valve peak gradient measures 3.1 mmHg. Aortic valve area, by VTI measures 2.37 cm. Pulmonic Valve: The pulmonic valve was normal in structure. Pulmonic valve regurgitation is mild. No evidence of pulmonic  stenosis. Aorta: The aortic root is normal in size and structure. Venous: The inferior vena cava is  normal in size with greater than 50% respiratory variability, suggesting right atrial pressure of 3 mmHg. IAS/Shunts: No atrial level shunt detected by color flow Doppler. Agitated saline contrast was given intravenously to evaluate for intracardiac shunting. Agitated saline contrast bubble study was negative, with no evidence of any interatrial shunt. 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  LEFT VENTRICLE PLAX 2D LVIDd:         5.10 cm     Diastology LVIDs:         4.20 cm     LV e' medial:    3.32 cm/s LV PW:         0.80 cm     LV E/e' medial:  21.6 LV IVS:        0.80 cm     LV e' lateral:   6.24 cm/s LVOT diam:     2.10 cm     LV E/e' lateral: 11.5 LV SV:         34 LV SV Index:   19 LVOT Area:     3.46 cm  LV Volumes (MOD) LV vol d, MOD A2C: 72.8 ml LV vol d, MOD A4C: 84.8 ml LV vol s, MOD A2C: 63.3 ml LV vol s, MOD A4C: 62.8 ml LV SV MOD A2C:     9.5 ml LV SV MOD A4C:     84.8 ml LV SV MOD BP:      18.8 ml RIGHT VENTRICLE RV S prime:     8.68 cm/s TAPSE (M-mode): 1.5 cm LEFT ATRIUM             Index        RIGHT ATRIUM           Index LA Vol (A2C):   46.9 ml 26.73 ml/m  RA Area:     13.30 cm LA Vol (A4C):   63.5 ml 36.19 ml/m  RA Volume:   31.30 ml  17.84 ml/m LA Biplane Vol: 55.4 ml 31.58 ml/m  AORTIC VALVE                    PULMONIC VALVE AV Area (Vmax):    2.49 cm     PR End Diast Vel: 14.44 msec AV Area (Vmean):   2.32 cm AV Area (VTI):     2.37 cm AV Vmax:           87.50 cm/s AV Vmean:          65.500 cm/s AV VTI:            0.144 m AV Peak Grad:      3.1 mmHg AV Mean Grad:      2.0 mmHg LVOT Vmax:         62.90 cm/s LVOT Vmean:        43.900 cm/s LVOT VTI:          0.098 m LVOT/AV VTI ratio: 0.68  AORTA Ao Root diam: 2.60 cm Ao Asc diam:  2.60 cm MITRAL VALVE MV Area (PHT): 8.72 cm    SHUNTS MV Decel Time: 87 msec     Systemic VTI:  0.10 m MV E velocity: 71.60 cm/s  Systemic Diam: 2.10 cm MV A  velocity: 42.40 cm/s MV E/A ratio:  1.69 Redell Shallow MD Electronically signed by Redell Shallow  MD Signature Date/Time: 09/07/2023/3:06:44 PM    Final      Medications:     Current Medications:  aspirin   81 mg Oral Daily   empagliflozin   10 mg Oral Daily   enoxaparin  (LOVENOX ) injection  40 mg Subcutaneous Q24H   feeding supplement (NEPRO CARB STEADY)  237 mL Oral Daily   furosemide   40 mg Intravenous BID   losartan   25 mg Oral Daily   multivitamin with minerals  1 tablet Oral Daily   spironolactone   12.5 mg Oral Daily   tamsulosin   0.4 mg Oral PC supper    Infusions:   Patient Profile   KASSON LAMERE is a 74 y.o. male with DMII and nephrolithiasis. AHF team to see for acute systolic heart failure.   Assessment/Plan  Acute systolic heart failure - Echo 2/0/74: EF <20%, possible layered thrombus, LV with GHK, GIIDD, RV mildly reduced, LA mod reduced, trivial MR  - NYHA IV on admission - Appears near euvolemia now though volume may be difficult to gauge, will check a ReDS clip. Will continue IV lasix  through today.  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  May need genetic testing OP. EKG changes noted, will need to r/o CAD - Continue Jardiance  10 mg daily - Continue Losartan  25 mg daily, plan for Entresto tomorrow - Continue spiro 12.5 mg daily - BB tomorrow post cath - Strict I&O,daily weights - Plan for [facility] tomorrow to r/o CAD and check R sided pressures.  - Will order cMRI to further evaluate possible thrombus seen on echo and r/o infiltrative heart disease Informed Consent   Shared Decision Making/Informed Consent The risks [stroke (1 in 1000), death (1 in 1000), kidney failure [usually temporary] (1 in 500), bleeding (1 in 200), allergic reaction [possibly serious] (1 in 200)], benefits (diagnostic support and management of coronary artery disease) and alternatives of a cardiac catheterization were discussed in detail with Mr. Scheff and he is willing to proceed.      DM  II - check Hgb A1c  - per primary  Chest pressure - chest pressure reported on admission - HsTrop 29>25 - EKG with T wave abnormality - Denies current or previous chest pain - Check lipid panel, plan for possible statin tomorrow - Continue ASA - Plan for [facility] tomorrow  LV thrombus  - possible layered apical thrombus seen on echo - start heparin  gtt, suspect he will need eliquis with low EF - cMRI today to further evaluate  Elevated SCr - Unknown baseline - follow with diuresis and addition of GDMT - Avoid hypotension   Length of Stay: 2  Beckey LITTIE Coe, NP  09/08/2023, 11:31 AM  Advanced Heart Failure Team Pager [phone] (M-F; 7a - 5p)  Please contact [facility] for night-coverage after hours (4p -7a ) and weekends on [email or web address]   Patient seen with NP, I formulated the plan and agree with the above note.   Patient has history of DM2 and HTN.  He has generally otherwise been in good health.  Retired from [employer], has stayed active.  Does all his yardwork.  Mother with history of CHF (does not know other details), father with some sort of heart surgery. He reports 2 wks of progressive exertional dyspnea then orthopnea.  No chest pain.  Went to the ER for evaluation. CTA chest with no PE.  Echo showed EF 20%, diffuse hypokinesis, mild RV dysfunction, cannot rule out apical thrombus on contrast images.   He was volume overloaded and  started on IV Lasix .   He feels much better with diuresis.    ECG is concerning for prior anteroseptal MI.   General: NAD Neck: JVP 10 cm, no thyromegaly or thyroid nodule.  Lungs: Clear to auscultation bilaterally with normal respiratory effort. CV: Nondisplaced PMI.  Heart regular S1/S2, no S3/S4, no murmur.  No peripheral edema.  No carotid bruit.  Normal pedal pulses.  Abdomen: Soft, nontender, no hepatosplenomegaly, no distention.  Skin: Intact without lesions or rashes.  Neurologic: Alert and oriented x 3.  Psych: Normal  affect. Extremities: No clubbing or cyanosis.  HEENT: Normal.   1. Acute systolic CHF: Symptoms for about 2 wks, no preceeding chest pain or viral syndrome.  Has history of CHF in mother. Echo showed EF 20%, diffuse hypokinesis, mild RV dysfunction, cannot rule out apical thrombus on contrast images.  HS-TnI minimally elevated at admission with no trend, suspect mildly elevated due to demand ischemia (no ACS).  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  He has DM2 and HTN along with the abnormal ECG, chronic ischemic cardiomyopathy is a definite possibility.  He remains volume overloaded on exam at least mildly but symptoms improving with diuresis.  Creatinine stable.  - Continue Lasix  40 mg IV bid today then stop.  - Continue losartan  25 mg daily, eventually transition to Entresto as long as creatinine remains stable after home lisinopril  has had appropriate wash-out.  - Continue spironolactone  12.5 daily.  - Continue Jardiance  10 daily.  - He will need left/right heart catheterization to assess filling pressures and cardiac output and also to assess for significant CAD.  Will plan on this tomorrow as long as creatinine is stable.  Discussed risks/benefits with patient and he agrees to procedure.  - Cardiac MRI will be done to confirm/rule out LV thrombus.  2. Elevated creatinine: 1.5 today.  ?CKD at baseline with DM2 and HTN. Follow closely.  - Now on Jardiance  10 mg daily.  3. Possible apical thrombus by echo: Only noted on contrast images and somewhat equivocal.  - Will start heparin  gtt for now but will also get cardiac MRI today to confirm/rule out.  4. CAD: No event in past that sounds like MI.  Has DM and HTN, ECG concerning for old ASMI.  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  - Plan cath tomorrow.  - Continue ASA 81 - Check lipids.   Ezra Shuck 09/08/2023 12:27 PM

## 2023-09-08 NOTE — Progress Notes (Signed)
 Progress Note   Patient: Joel Pitts FMW:[id number] DOB: Feb 08, 1951 DOA: 09/06/2023     2 DOS: the patient was seen and examined on 09/08/2023   Brief hospital course: Joel Pitts was admitted to the hospital with the working diagnosis of heart failure exacerbation.   73 yo male with the past medical history of T2DM, and nephrolithiasis who presented with dyspnea, lower extremity edema, and orthopnea for the last 2  weeks. He was evaluated by his primary care 24 hrs prior to admission, he was found volume overloaded and recommended to be evaluated in the ED. On his initial physical examination his blood pressure 114/85, HR 107, RR 17 and 02 saturation 99%, lungs with no wheezing, positive bilateral rales, heart with S1 and S2 present and regular, with no gallops, rubs or murmurs, abdomen with no distention and positive lower extremity edema   Na 128, K 4.8 Cl 97 bicarbonate 19 glucose 243, bun 16 cr 1,53  BNP 2,251 High sensitive troponin 29 and 25  Wbc 11.0 hgb 17,9 plt 219   D dimer 1.89   Chest radiograph with cardiomegaly, bilateral hilar vascular congestion, bilateral central interstitial infiltrates, bilateral pleural effusions, fluid in the right fissure. Age indeterminate mild compression fracture in the midthoracic spine.   CT chest with bilateral ground glass opacities with bilateral pleural effusions.  No embolism to the proximal subsegmental pulmonary artery level.  Moderate anterior wedging deformity of T7 vertebral body, of indeterminate age, but favored subacute to chronic in etiology.   EKG 111 bpm, left axis deviation, qtc 462, sinus rhythm with q wave V1 to V3, no significant ST segment changes, negative T wave lead I and aVL.   Patient was placed on furosemide  IV for diuresis.  Echocardiogram with reduced LV systolic function.   Assessment and Plan: * Acute on chronic systolic CHF (congestive heart failure) (HCC) Echocardiogram with decreased LV systolic function  with EF < 20%, global hypokinesis, RV systolic function with mild reduction, LA with moderate dilatation, no significant valvular disease.  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   Improved volume status, urine output not documented but positive weight loss 3 Kg since yesterday.   Plan to continue with losartan  and SGLT 2 inh.  Add spironolactone  Diuresis with IV furosemide  40 mg IV bid.  Possible transition to entresto on this hospitalization Hold on B blocker for now, due to reduced LV systolic function.   Patient will need further workup for new systolic heart failure, cardiac catheterization and cardiac MRI (to rule out LV thrombus).   Consulting advanced heart failure team.   AKI (acute kidney injury) (HCC) Hyponatremia (possible CKD stage 3a)  Renal function stable with serum cr at 1,51 with K at 4,4 and serum bicarbonate at 20  Na 134.  Continue diuresis with furosemide , spironolactone  and SGLT 2 inh Follow up renal function and electrolytes in am.  Avoid hypotension and nephrotoxic medications   Essential hypertension Continue blood pressure control with losartan , possible transition to entresto   Type 2 diabetes mellitus (HCC) Continue glucose cover and monitoring with insulin sliding scale Hyperglycemia   Continue SGLT 2 inh, will resume metformin at the time of discharge.   BPH (benign prostatic hyperplasia) Continue with flomax , no signs of acute urinary retention.      Subjective: Patient with improvement in dyspnea and orthopnea, no chest pain, no dizziness or lightheadedness.   Physical Exam: Vitals:   09/08/23 0100 09/08/23 0409 09/08/23 0635 09/08/23 0810  BP:    116/82  Pulse: 95  (!) 101 (!) 109  Resp:  17  18  Temp:  97.6 F (36.4 C)  97.8 F (36.6 C)  TempSrc:  Oral  Oral  SpO2: 96%  98% 96%  Weight:      Height:       Neurology awake and alert ENT with mild pallor with no icterus Cardiovascular with S1 and S2 present and regular, no  murmurs Mild JVD Respiratory with no wheezing or rhonchi, no rales Abdomen with no distention, non tender No lower extremity edema   Data Reviewed:    Family Communication: no family at the bedside   Disposition: Status is: Inpatient Remains inpatient appropriate because: Heart failure workup   Planned Discharge Destination: Home    Author: Elidia Toribio Furnace, MD 09/08/2023 9:12 AM  For on call review [email or web address].

## 2023-09-08 NOTE — Plan of Care (Signed)
  Problem: Health Behavior/Discharge Planning: Goal: Ability to manage health-related needs will improve Outcome: Progressing   Problem: Clinical Measurements: Goal: Will remain free from infection Outcome: Progressing Goal: Respiratory complications will improve Outcome: Progressing Goal: Cardiovascular complication will be avoided Outcome: Progressing   Problem: Activity: Goal: Risk for activity intolerance will decrease Outcome: Progressing   Problem: Nutrition: Goal: Adequate nutrition will be maintained Outcome: Progressing   Problem: Safety: Goal: Ability to remain free from injury will improve Outcome: Progressing

## 2023-09-08 NOTE — H&P (View-Only) (Signed)
 Advanced Heart Failure Team Consult Note   Primary Physician: Nanci Senior, MD Cardiologist:  None  Reason for Consultation: acute systolic heart failure  HPI:    Joel Pitts is seen today for evaluation of acute systolic heart failure at the request of Dr. Arrien, hospital medicine.   Joel Pitts is a 73 y.o. male with DMII and nephrolithiasis. He presented to the ED Tuesday after being evaluated by his PCP who recommended he present to the ED.  His PCP noted EKG to be abnormal.  Joel Pitts reports 2 weeks of worsening SOB, orthopnea, chest pressure and BLE edema.  Had not experience any of this prior. He follows regularly with PCP, 2 times a year.  Denies heavy EtOH use (drinks 1 beer almost every night), smoking, illicit drug use.  Eats out almost daily it is easier for him.  Reports family history of dad having open heart surgery (now deceased) and mom with what sounds like CHF, also deceased. Reports no chest pain, he would just feel pressure when he laid down or with prolonged exertion.   Admission labs reviewed: K 4.8, SCr 1.53, HsTrop 29>25. BNP 2.2K. D dimer 1.89, CT chest negative for PE, pleural effusions seen. Echo showed EF <20%, possible layered thrombus, LV with GHK, GIIDD, RV mildly reduced, LA mod reduced, trivial MR. Has diuresed ~7lbs with IV Lasix .   Now denies CP or SOB.   Cardiac studies reviewed:  Echo 09/07/23: EF <20%, possible layered thrombus, LV with GHK, GIIDD, RV mildly reduced, LA mod reduced, trivial MR Rest Stress Myoview  10' normal.   Home Medications Prior to Admission medications   Medication Sig Start Date End Date Taking? Authorizing Provider  aspirin  81 MG tablet Take 81 mg by mouth daily.   Yes [provider]  lisinopril  (PRINIVIL ,ZESTRIL ) 5 MG tablet Take 5 mg by mouth every evening.    Yes [provider]  metFORMIN (GLUCOPHAGE) 500 MG tablet Take 500 mg by mouth 2 (two) times daily with a meal.   Yes [provider]  silodosin (RAPAFLO) 8 MG CAPS capsule Take 8 mg by mouth daily. 08/29/23  Yes [provider]  zolpidem (AMBIEN) 10 MG tablet Take 10 mg by mouth at bedtime.    [provider]    Past Medical History: Past Medical History:  Diagnosis Date   Diabetes mellitus without complication (HCC)    History of kidney stones     Past Surgical History: Past Surgical History:  Procedure Laterality Date   LITHOTRIPSY      Family History: History reviewed. No pertinent family history.  Social History: Social History   Socioeconomic History   Marital status: Married    Spouse name: Not on file   Number of children: Not on file   Years of education: Not on file   Highest education level: Not on file  Occupational History   Not on file  Tobacco Use   Smoking status: Never   Smokeless tobacco: Never  Substance and Sexual Activity   Alcohol use: Yes    Alcohol/week: 7.0 standard drinks of alcohol    Types: 7 Cans of beer per week   Drug use: No   Sexual activity: Not on file  Other Topics Concern   Not on file  Social History Narrative   Not on file   Social Drivers of Health   Financial Resource Strain: Not on file  Food Insecurity: No Food Insecurity (09/07/2023)   Hunger Vital  Sign    Worried About Programme researcher, broadcasting/film/video in the Last Year: Never true    Ran Out of Food in the Last Year: Never true  Transportation Needs: No Transportation Needs (09/07/2023)   PRAPARE - Administrator, Civil Service (Medical): No    Lack of Transportation (Non-Medical): No  Physical Activity: Not on file  Stress: Not on file  Social Connections: Unknown (09/07/2023)   Social Connection and Isolation Panel    Frequency of Communication with Friends and Family: Once a week    Frequency of Social Gatherings with Friends and Family: Once a week    Attends Religious Services: 1 to 4 times per year    Active Member of [employer] or Organizations: Yes    Attends Occupational hygienist Meetings: 1 to 4 times per year    Marital Status: Patient declined   Allergies:  Allergies  Allergen Reactions   Statins Diarrhea    Objective:    Vital Signs:   Temp:  [97.5 F (36.4 C)-97.8 F (36.6 C)] 97.8 F (36.6 C) (07/10 0810) Pulse Rate:  [95-109] 109 (07/10 0810) Resp:  [16-18] 18 (07/10 0810) BP: (102-116)/(73-82) 116/82 (07/10 0810) SpO2:  [92 %-98 %] 96 % (07/10 0810) Weight:  [63.5 kg] 63.5 kg (07/09 1135) Last BM Date : 09/06/23  Weight change: Filed Weights   09/06/23 0743 09/07/23 1135  Weight: 66.7 kg 63.5 kg    Intake/Output:   Intake/Output Summary (Last 24 hours) at 09/08/2023 1131 Last data filed at 09/08/2023 1100 Gross per 24 hour  Intake 118 ml  Output 600 ml  Net -482 ml    Physical Exam  General:  well appearing.  No respiratory difficulty Neck: supple. JVD ~7 cm.  Cor: PMI nondisplaced. Regular rate & rhythm. No rubs, gallops or murmurs. Lungs: clear Extremities: no cyanosis, clubbing, rash, edema  Neuro: alert & oriented x 3. Moves all 4 extremities w/o difficulty. Affect pleasant.   Telemetry   ST 110s (Personally reviewed)    EKG    ST 111 bpm  Labs   Basic Metabolic Panel: Recent Labs  Lab 09/06/23 0746 09/07/23 0232 09/08/23 0421  NA 128* 131* 134*  K 4.8 4.0 4.4  CL 97* 98 100  CO2 19* 23 20*  GLUCOSE 243* 145* 149*  BUN 16 17 19   CREATININE 1.53* 1.41* 1.51*  CALCIUM 9.3 8.6* 8.6*  MG  --   --  2.0    Liver Function Tests: No results for input(s): AST, ALT, ALKPHOS, BILITOT, PROT, ALBUMIN  in the last 168 hours. No results for input(s): LIPASE, AMYLASE in the last 168 hours. No results for input(s): AMMONIA in the last 168 hours.  CBC: Recent Labs  Lab 09/06/23 0746  WBC 11.0*  HGB 17.9*  HCT 50.8  MCV 93.2  PLT 219    Cardiac Enzymes: No results for input(s): CKTOTAL, CKMB, CKMBINDEX, TROPONINI in the last 168 hours.  BNP: BNP (last 3  results) Recent Labs    09/06/23 0856  BNP 2,251.1*    ProBNP (last 3 results) No results for input(s): PROBNP in the last 8760 hours.   CBG: No results for input(s): GLUCAP in the last 168 hours.  Coagulation Studies: No results for input(s): LABPROT, INR in the last 72 hours.   Imaging   ECHOCARDIOGRAM COMPLETE BUBBLE STUDY Result Date: 09/07/2023    ECHOCARDIOGRAM REPORT   Patient Name:   Joel Pitts Date of Exam: 09/07/2023 Medical Rec #:  [id number]        Height:       66.0 in Accession #:    [id number]       Weight:       147.0 lb Date of Birth:  1950/09/10        BSA:          1.755 m Patient Age:    73 years         BP:           114/89 mmHg Patient Gender: M                HR:           102 bpm. Exam Location:  Inpatient Procedure: 2D Echo, Color Doppler, Cardiac Doppler, Saline Contrast Bubble Study            and Intracardiac Opacification Agent (Both Spectral and Color Flow            Doppler were utilized during procedure). Indications:    Elevated BNP  History:        Patient has no prior history of Echocardiogram examinations.                 CHF; Risk Factors:Diabetes and Hypertension.  Sonographer:    Christiana Mbomeh Referring Phys: [id number] MARSHA ADA IMPRESSIONS  1. 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  2. Left ventricular ejection fraction, by estimation, is <20%. The left ventricle has severely decreased function. The left ventricle demonstrates global hypokinesis. Left ventricular diastolic parameters are consistent with Grade II diastolic dysfunction (pseudonormalization). Elevated left atrial pressure.  3. Right ventricular systolic function is mildly reduced. The right ventricular size is normal.  4. Left atrial size was moderately dilated.  5. The mitral valve is normal in structure. Trivial mitral valve regurgitation. No evidence of mitral stenosis.  6. The aortic valve is  tricuspid. Aortic valve regurgitation is not visualized. No aortic stenosis is present.  7. The inferior vena cava is normal in size with greater than 50% respiratory variability, suggesting right atrial pressure of 3 mmHg.  8. Agitated saline contrast bubble study was negative, with no evidence of any interatrial shunt. Comparison(s): No prior Echocardiogram. FINDINGS  Left Ventricle: Left ventricular ejection fraction, by estimation, is <20%. The left ventricle has severely decreased function. The left ventricle demonstrates global hypokinesis. Definity  contrast agent was given IV to delineate the left ventricular endocardial borders. The left ventricular internal cavity size was normal in size. There is no left ventricular hypertrophy. Left ventricular diastolic parameters are consistent with Grade II diastolic dysfunction (pseudonormalization). Elevated left atrial pressure. Right Ventricle: The right ventricular size is normal. Right ventricular systolic function is mildly reduced. Left Atrium: Left atrial size was moderately dilated. Right Atrium: Right atrial size was normal in size. Pericardium: There is no evidence of pericardial effusion. Mitral Valve: The mitral valve is normal in structure. Mild mitral annular calcification. Trivial mitral valve regurgitation. No evidence of mitral valve stenosis. Tricuspid Valve: The tricuspid valve is normal in structure. Tricuspid valve regurgitation is trivial. No evidence of tricuspid stenosis. Aortic Valve: The aortic valve is tricuspid. Aortic valve regurgitation is not visualized. No aortic stenosis is present. Aortic valve mean gradient measures 2.0 mmHg. Aortic valve peak gradient measures 3.1 mmHg. Aortic valve area, by VTI measures 2.37 cm. Pulmonic Valve: The pulmonic valve was normal in structure. Pulmonic valve regurgitation is mild. No evidence of pulmonic  stenosis. Aorta: The aortic root is normal in size and structure. Venous: The inferior vena cava is  normal in size with greater than 50% respiratory variability, suggesting right atrial pressure of 3 mmHg. IAS/Shunts: No atrial level shunt detected by color flow Doppler. Agitated saline contrast was given intravenously to evaluate for intracardiac shunting. Agitated saline contrast bubble study was negative, with no evidence of any interatrial shunt. 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  LEFT VENTRICLE PLAX 2D LVIDd:         5.10 cm     Diastology LVIDs:         4.20 cm     LV e' medial:    3.32 cm/s LV PW:         0.80 cm     LV E/e' medial:  21.6 LV IVS:        0.80 cm     LV e' lateral:   6.24 cm/s LVOT diam:     2.10 cm     LV E/e' lateral: 11.5 LV SV:         34 LV SV Index:   19 LVOT Area:     3.46 cm  LV Volumes (MOD) LV vol d, MOD A2C: 72.8 ml LV vol d, MOD A4C: 84.8 ml LV vol s, MOD A2C: 63.3 ml LV vol s, MOD A4C: 62.8 ml LV SV MOD A2C:     9.5 ml LV SV MOD A4C:     84.8 ml LV SV MOD BP:      18.8 ml RIGHT VENTRICLE RV S prime:     8.68 cm/s TAPSE (M-mode): 1.5 cm LEFT ATRIUM             Index        RIGHT ATRIUM           Index LA Vol (A2C):   46.9 ml 26.73 ml/m  RA Area:     13.30 cm LA Vol (A4C):   63.5 ml 36.19 ml/m  RA Volume:   31.30 ml  17.84 ml/m LA Biplane Vol: 55.4 ml 31.58 ml/m  AORTIC VALVE                    PULMONIC VALVE AV Area (Vmax):    2.49 cm     PR End Diast Vel: 14.44 msec AV Area (Vmean):   2.32 cm AV Area (VTI):     2.37 cm AV Vmax:           87.50 cm/s AV Vmean:          65.500 cm/s AV VTI:            0.144 m AV Peak Grad:      3.1 mmHg AV Mean Grad:      2.0 mmHg LVOT Vmax:         62.90 cm/s LVOT Vmean:        43.900 cm/s LVOT VTI:          0.098 m LVOT/AV VTI ratio: 0.68  AORTA Ao Root diam: 2.60 cm Ao Asc diam:  2.60 cm MITRAL VALVE MV Area (PHT): 8.72 cm    SHUNTS MV Decel Time: 87 msec     Systemic VTI:  0.10 m MV E velocity: 71.60 cm/s  Systemic Diam: 2.10 cm MV A  velocity: 42.40 cm/s MV E/A ratio:  1.69 Redell Shallow MD Electronically signed by Redell Shallow  MD Signature Date/Time: 09/07/2023/3:06:44 PM    Final      Medications:     Current Medications:  aspirin   81 mg Oral Daily   empagliflozin   10 mg Oral Daily   enoxaparin  (LOVENOX ) injection  40 mg Subcutaneous Q24H   feeding supplement (NEPRO CARB STEADY)  237 mL Oral Daily   furosemide   40 mg Intravenous BID   losartan   25 mg Oral Daily   multivitamin with minerals  1 tablet Oral Daily   spironolactone   12.5 mg Oral Daily   tamsulosin   0.4 mg Oral PC supper    Infusions:   Patient Profile   Joel Pitts is a 73 y.o. male with DMII and nephrolithiasis. AHF team to see for acute systolic heart failure.   Assessment/Plan  Acute systolic heart failure - Echo 2/0/74: EF <20%, possible layered thrombus, LV with GHK, GIIDD, RV mildly reduced, LA mod reduced, trivial MR  - NYHA IV on admission - Appears near euvolemia now though volume may be difficult to gauge, will check a ReDS clip. Will continue IV lasix  through today.  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  May need genetic testing OP. EKG changes noted, will need to r/o CAD - Continue Jardiance  10 mg daily - Continue Losartan  25 mg daily, plan for Entresto tomorrow - Continue spiro 12.5 mg daily - BB tomorrow post cath - Strict I&O,daily weights - Plan for [facility] tomorrow to r/o CAD and check R sided pressures.  - Will order cMRI to further evaluate possible thrombus seen on echo and r/o infiltrative heart disease Informed Consent   Shared Decision Making/Informed Consent The risks [stroke (1 in 1000), death (1 in 1000), kidney failure [usually temporary] (1 in 500), bleeding (1 in 200), allergic reaction [possibly serious] (1 in 200)], benefits (diagnostic support and management of coronary artery disease) and alternatives of a cardiac catheterization were discussed in detail with Joel Pitts and he is willing to proceed.      DM  II - check Hgb A1c  - per primary  Chest pressure - chest pressure reported on admission - HsTrop 29>25 - EKG with T wave abnormality - Denies current or previous chest pain - Check lipid panel, plan for possible statin tomorrow - Continue ASA - Plan for [facility] tomorrow  LV thrombus  - possible layered apical thrombus seen on echo - start heparin  gtt, suspect he will need eliquis with low EF - cMRI today to further evaluate  Elevated SCr - Unknown baseline - follow with diuresis and addition of GDMT - Avoid hypotension   Length of Stay: 2  Beckey LITTIE Coe, NP  09/08/2023, 11:31 AM  Advanced Heart Failure Team Pager [phone] (M-F; 7a - 5p)  Please contact [facility] for night-coverage after hours (4p -7a ) and weekends on [email or web address]   Patient seen with NP, I formulated the plan and agree with the above note.   Patient has history of DM2 and HTN.  He has generally otherwise been in good health.  Retired from [employer], has stayed active.  Does all his yardwork.  Mother with history of CHF (does not know other details), father with some sort of heart surgery. He reports 2 wks of progressive exertional dyspnea then orthopnea.  No chest pain.  Went to the ER for evaluation. CTA chest with no PE.  Echo showed EF 20%, diffuse hypokinesis, mild RV dysfunction, cannot rule out apical thrombus on contrast images.   He was volume overloaded and  started on IV Lasix .   He feels much better with diuresis.    ECG is concerning for prior anteroseptal MI.   General: NAD Neck: JVP 10 cm, no thyromegaly or thyroid nodule.  Lungs: Clear to auscultation bilaterally with normal respiratory effort. CV: Nondisplaced PMI.  Heart regular S1/S2, no S3/S4, no murmur.  No peripheral edema.  No carotid bruit.  Normal pedal pulses.  Abdomen: Soft, nontender, no hepatosplenomegaly, no distention.  Skin: Intact without lesions or rashes.  Neurologic: Alert and oriented x 3.  Psych: Normal  affect. Extremities: No clubbing or cyanosis.  HEENT: Normal.   1. Acute systolic CHF: Symptoms for about 2 wks, no preceeding chest pain or viral syndrome.  Has history of CHF in mother. Echo showed EF 20%, diffuse hypokinesis, mild RV dysfunction, cannot rule out apical thrombus on contrast images.  HS-TnI minimally elevated at admission with no trend, suspect mildly elevated due to demand ischemia (no ACS).  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  He has DM2 and HTN along with the abnormal ECG, chronic ischemic cardiomyopathy is a definite possibility.  He remains volume overloaded on exam at least mildly but symptoms improving with diuresis.  Creatinine stable.  - Continue Lasix  40 mg IV bid today then stop.  - Continue losartan  25 mg daily, eventually transition to Entresto as long as creatinine remains stable after home lisinopril  has had appropriate wash-out.  - Continue spironolactone  12.5 daily.  - Continue Jardiance  10 daily.  - He will need left/right heart catheterization to assess filling pressures and cardiac output and also to assess for significant CAD.  Will plan on this tomorrow as long as creatinine is stable.  Discussed risks/benefits with patient and he agrees to procedure.  - Cardiac MRI will be done to confirm/rule out LV thrombus.  2. Elevated creatinine: 1.5 today.  ?CKD at baseline with DM2 and HTN. Follow closely.  - Now on Jardiance  10 mg daily.  3. Possible apical thrombus by echo: Only noted on contrast images and somewhat equivocal.  - Will start heparin  gtt for now but will also get cardiac MRI today to confirm/rule out.  4. CAD: No event in past that sounds like MI.  Has DM and HTN, ECG concerning for old ASMI.  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  - Plan cath tomorrow.  - Continue ASA 81 - Check lipids.   Ezra Shuck 09/08/2023 12:27 PM

## 2023-09-09 ENCOUNTER — Encounter (HOSPITAL_COMMUNITY)
Admission: EM | Disposition: A | Discharge status: Home Health | Payer: Self-pay | Source: Ambulatory Visit | Attending: Thoracic Surgery (Cardiothoracic Vascular Surgery)

## 2023-09-09 ENCOUNTER — Inpatient Hospital Stay (HOSPITAL_COMMUNITY)

## 2023-09-09 DIAGNOSIS — I255 Ischemic cardiomyopathy: Secondary | ICD-10-CM | POA: Diagnosis not present

## 2023-09-09 DIAGNOSIS — I5021 Acute systolic (congestive) heart failure: Secondary | ICD-10-CM

## 2023-09-09 DIAGNOSIS — I110 Hypertensive heart disease with heart failure: Secondary | ICD-10-CM

## 2023-09-09 DIAGNOSIS — I2510 Atherosclerotic heart disease of native coronary artery without angina pectoris: Secondary | ICD-10-CM

## 2023-09-09 DIAGNOSIS — E1159 Type 2 diabetes mellitus with other circulatory complications: Secondary | ICD-10-CM

## 2023-09-09 DIAGNOSIS — I5023 Acute on chronic systolic (congestive) heart failure: Secondary | ICD-10-CM | POA: Diagnosis not present

## 2023-09-09 HISTORY — PX: RIGHT/LEFT HEART CATH AND CORONARY ANGIOGRAPHY: CATH118266

## 2023-09-09 LAB — BASIC METABOLIC PANEL WITH GFR
Anion gap: 14 (ref 5–15)
BUN: 27 mg/dL — ABNORMAL HIGH (ref 8–23)
CO2: 24 mmol/L (ref 22–32)
Calcium: 9.1 mg/dL (ref 8.9–10.3)
Chloride: 98 mmol/L (ref 98–111)
Creatinine, Ser: 1.57 mg/dL — ABNORMAL HIGH (ref 0.61–1.24)
GFR, Estimated: 46 mL/min — ABNORMAL LOW (ref >60)
Glucose, Bld: 160 mg/dL — ABNORMAL HIGH (ref 70–99)
Potassium: 3.8 mmol/L (ref 3.5–5.1)
Sodium: 136 mmol/L (ref 135–145)

## 2023-09-09 LAB — POCT I-STAT EG7
Acid-Base Excess: 1 mmol/L (ref 0.0–2.0)
Acid-Base Excess: 1 mmol/L (ref 0.0–2.0)
Bicarbonate: 24.8 mmol/L (ref 20.0–28.0)
Bicarbonate: 25.2 mmol/L (ref 20.0–28.0)
Calcium, Ion: 1.1 mmol/L — ABNORMAL LOW (ref 1.15–1.40)
Calcium, Ion: 1.13 mmol/L — ABNORMAL LOW (ref 1.15–1.40)
HCT: 50 % (ref 39.0–52.0)
HCT: 51 % (ref 39.0–52.0)
Hemoglobin: 17 g/dL (ref 13.0–17.0)
Hemoglobin: 17.3 g/dL — ABNORMAL HIGH (ref 13.0–17.0)
O2 Saturation: 68 %
O2 Saturation: 73 %
Potassium: 3.5 mmol/L (ref 3.5–5.1)
Potassium: 3.6 mmol/L (ref 3.5–5.1)
Sodium: 137 mmol/L (ref 135–145)
Sodium: 137 mmol/L (ref 135–145)
TCO2: 26 mmol/L (ref 22–32)
TCO2: 26 mmol/L (ref 22–32)
pCO2, Ven: 37.2 mmHg — ABNORMAL LOW (ref 44–60)
pCO2, Ven: 37.6 mmHg — ABNORMAL LOW (ref 44–60)
pH, Ven: 7.433 — ABNORMAL HIGH (ref 7.25–7.43)
pH, Ven: 7.435 — ABNORMAL HIGH (ref 7.25–7.43)
pO2, Ven: 34 mmHg (ref 32–45)
pO2, Ven: 37 mmHg (ref 32–45)

## 2023-09-09 LAB — CBC
HCT: 53.7 % — ABNORMAL HIGH (ref 39.0–52.0)
Hemoglobin: 19.2 g/dL — ABNORMAL HIGH (ref 13.0–17.0)
MCH: 32.8 pg (ref 26.0–34.0)
MCHC: 35.8 g/dL (ref 30.0–36.0)
MCV: 91.8 fL (ref 80.0–100.0)
Platelets: 175 K/uL (ref 150–400)
RBC: 5.85 MIL/uL — ABNORMAL HIGH (ref 4.22–5.81)
RDW: 12.6 % (ref 11.5–15.5)
WBC: 8.2 K/uL (ref 4.0–10.5)
nRBC: 0 % (ref 0.0–0.2)

## 2023-09-09 LAB — MAGNESIUM: Magnesium: 2.2 mg/dL (ref 1.7–2.4)

## 2023-09-09 LAB — HEPARIN LEVEL (UNFRACTIONATED): Heparin Unfractionated: 0.48 [IU]/mL (ref 0.30–0.70)

## 2023-09-09 LAB — HEMOGLOBIN A1C
Hgb A1c MFr Bld: 7.2 % — ABNORMAL HIGH (ref 4.8–5.6)
Mean Plasma Glucose: 160 mg/dL

## 2023-09-09 SURGERY — RIGHT/LEFT HEART CATH AND CORONARY ANGIOGRAPHY
Anesthesia: LOCAL

## 2023-09-09 MED ORDER — ACETAMINOPHEN 325 MG PO TABS
650.0000 mg | ORAL_TABLET | ORAL | Status: DC | PRN
Start: 2023-09-09 — End: 2023-09-12

## 2023-09-09 MED ORDER — HEPARIN (PORCINE) IN NACL 1000-0.9 UT/500ML-% IV SOLN
INTRAVENOUS | Status: DC | PRN
  Administered 2023-09-09 (×2): 500 mL

## 2023-09-09 MED ORDER — ONDANSETRON HCL 4 MG/2ML IJ SOLN: 4.0000 mg | Freq: Four times a day (QID) | INTRAMUSCULAR | Status: DC | PRN

## 2023-09-09 MED ORDER — LABETALOL HCL 5 MG/ML IV SOLN: 10.0000 mg | INTRAVENOUS | Status: DC | PRN

## 2023-09-09 MED ORDER — MIDAZOLAM HCL 2 MG/2ML IJ SOLN
INTRAMUSCULAR | Status: DC | PRN
Start: 2023-09-09 — End: 2023-09-09
  Administered 2023-09-09: 1 mg via INTRAVENOUS

## 2023-09-09 MED ORDER — MIDAZOLAM HCL 2 MG/2ML IJ SOLN
INTRAMUSCULAR | Status: AC
  Filled 2023-09-09: qty 2

## 2023-09-09 MED ORDER — FENTANYL CITRATE (PF) 100 MCG/2ML IJ SOLN
INTRAMUSCULAR | Status: AC
  Filled 2023-09-09: qty 2

## 2023-09-09 MED ORDER — HEPARIN (PORCINE) 25000 UT/250ML-% IV SOLN
1050.0000 [IU]/h | INTRAVENOUS | Status: DC
  Administered 2023-09-09 – 2023-09-11 (×3): 1050 [IU]/h via INTRAVENOUS
  Filled 2023-09-09 (×2): qty 250

## 2023-09-09 MED ORDER — SODIUM CHLORIDE 0.9% FLUSH
3.0000 mL | INTRAVENOUS | Status: DC | PRN
Start: 2023-09-09 — End: 2023-09-12

## 2023-09-09 MED ORDER — SPIRONOLACTONE 25 MG PO TABS
25.0000 mg | ORAL_TABLET | Freq: Every day | ORAL | Status: DC
  Administered 2023-09-09 – 2023-09-11 (×3): 25 mg via ORAL
  Filled 2023-09-09 (×3): qty 1

## 2023-09-09 MED ORDER — LIDOCAINE HCL (PF) 1 % IJ SOLN
INTRAMUSCULAR | Status: DC | PRN
  Administered 2023-09-09 (×2): 2 mL

## 2023-09-09 MED ORDER — VERAPAMIL HCL 2.5 MG/ML IV SOLN
INTRAVENOUS | Status: AC
  Filled 2023-09-09: qty 2

## 2023-09-09 MED ORDER — GADOBUTROL 1 MMOL/ML IV SOLN
10.0000 mL | Freq: Once | INTRAVENOUS | Status: AC | PRN
  Administered 2023-09-09: 10 mL via INTRAVENOUS

## 2023-09-09 MED ORDER — HEPARIN SODIUM (PORCINE) 1000 UNIT/ML IJ SOLN
INTRAMUSCULAR | Status: AC
  Filled 2023-09-09: qty 10

## 2023-09-09 MED ORDER — SODIUM CHLORIDE 0.9 % IV SOLN: INTRAVENOUS | Status: DC

## 2023-09-09 MED ORDER — LIDOCAINE HCL (PF) 1 % IJ SOLN
INTRAMUSCULAR | Status: AC
  Filled 2023-09-09: qty 30

## 2023-09-09 MED ORDER — DIGOXIN 125 MCG PO TABS
0.1250 mg | ORAL_TABLET | Freq: Every day | ORAL | Status: DC
  Administered 2023-09-09 – 2023-09-11 (×3): 0.125 mg via ORAL
  Filled 2023-09-09 (×3): qty 1

## 2023-09-09 MED ORDER — HEPARIN SODIUM (PORCINE) 1000 UNIT/ML IJ SOLN
INTRAMUSCULAR | Status: DC | PRN
  Administered 2023-09-09: 3000 [IU] via INTRAVENOUS

## 2023-09-09 MED ORDER — IOHEXOL 350 MG/ML SOLN
INTRAVENOUS | Status: DC | PRN
  Administered 2023-09-09: 40 mL

## 2023-09-09 MED ORDER — SODIUM CHLORIDE 0.9 % IV SOLN
INTRAVENOUS | Status: DC
Start: 2023-09-10 — End: 2023-09-09

## 2023-09-09 MED ORDER — FENTANYL CITRATE (PF) 100 MCG/2ML IJ SOLN
INTRAMUSCULAR | Status: DC | PRN
  Administered 2023-09-09: 25 ug via INTRAVENOUS

## 2023-09-09 MED ORDER — ASPIRIN 81 MG PO CHEW
81.0000 mg | CHEWABLE_TABLET | ORAL | Status: AC
  Administered 2023-09-09: 81 mg via ORAL
  Filled 2023-09-09: qty 1

## 2023-09-09 MED ORDER — SODIUM CHLORIDE 0.9% FLUSH
3.0000 mL | Freq: Two times a day (BID) | INTRAVENOUS | Status: DC
Start: 2023-09-09 — End: 2023-09-12
  Administered 2023-09-09 – 2023-09-11 (×5): 3 mL via INTRAVENOUS

## 2023-09-09 MED ORDER — HYDRALAZINE HCL 20 MG/ML IJ SOLN
10.0000 mg | INTRAMUSCULAR | Status: AC | PRN
Start: 2023-09-09 — End: 2023-09-09

## 2023-09-09 MED ORDER — SODIUM CHLORIDE 0.9 % IV SOLN: 250.0000 mL | INTRAVENOUS | Status: AC | PRN

## 2023-09-09 MED ORDER — VERAPAMIL HCL 2.5 MG/ML IV SOLN
INTRAVENOUS | Status: DC | PRN
  Administered 2023-09-09: 10 mL via INTRA_ARTERIAL

## 2023-09-09 SURGICAL SUPPLY — 10 items
CATH 5FR JL3.5 JR4 ANG PIG MP (CATHETERS) IMPLANT
CATH BALLN WEDGE 5F 110CM (CATHETERS) IMPLANT
DEVICE RAD COMP TR BAND LRG (VASCULAR PRODUCTS) IMPLANT
DEVICE RAD TR BAND REGULAR (VASCULAR PRODUCTS) IMPLANT
GLIDESHEATH SLEND SS 6F .021 (SHEATH) IMPLANT
GUIDEWIRE INQWIRE 1.5J.035X260 (WIRE) IMPLANT
PACK CARDIAC CATHETERIZATION (CUSTOM PROCEDURE TRAY) ×1 IMPLANT
SET ATX-X65L (MISCELLANEOUS) IMPLANT
SHEATH GLIDE SLENDER 4/5FR (SHEATH) IMPLANT
SHEATH PROBE COVER 6X72 (BAG) IMPLANT

## 2023-09-09 NOTE — Plan of Care (Signed)
  Problem: Health Behavior/Discharge Planning: Goal: Ability to manage health-related needs will improve Outcome: Progressing   Problem: Clinical Measurements: Goal: Ability to maintain clinical measurements within normal limits will improve Outcome: Progressing Goal: Will remain free from infection Outcome: Progressing Goal: Respiratory complications will improve Outcome: Progressing Goal: Cardiovascular complication will be avoided Outcome: Progressing   Problem: Activity: Goal: Risk for activity intolerance will decrease Outcome: Progressing   Problem: Coping: Goal: Level of anxiety will decrease Outcome: Progressing   Problem: Safety: Goal: Ability to remain free from injury will improve Outcome: Progressing

## 2023-09-09 NOTE — Interval H&P Note (Signed)
 History and Physical Interval Note:  09/09/2023 10:52 AM  Joel Pitts  has presented today for surgery, with the diagnosis of heart failure.  The various methods of treatment have been discussed with the patient and family. After consideration of risks, benefits and other options for treatment, the patient has consented to  Procedure(s): RIGHT/LEFT HEART CATH AND CORONARY ANGIOGRAPHY (N/A) as a surgical intervention.  The patient's history has been reviewed, patient examined, no change in status, stable for surgery.  I have reviewed the patient's chart and labs.  Questions were answered to the patient's satisfaction.     Onita Pfluger [employer]

## 2023-09-09 NOTE — Consult Note (Addendum)
 [address]       [place] [address]             [phone]        Joel Pitts [facility] Medical Record #[id number] Date of Birth: [birth year]  Referring: Dr. 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 PCP (Dr. Nanci)  recommended he present to the ED on 09/06/2023 for further evaluation.  EKG showed ST Anteroseptal infarct , age undetermined T wave abnormality, consider lateral ischemia. Initial Troponin I (high sensitivity) was 29 and subsequently decreased. BNP was 2,251. Chest x ray showed low lung volumes, trace left pleural effusion. CTA showed no PE and bilateral moderate pleural effusions R>L. He was diuresed.   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 Cardiac catheterization done today showed a 99% stenosis of distal LM to proximal LAD. MRI viability study has been ordered. Cardiothoracic consultation was requested for consideration of coronary artery bypass grafting surgery. At the time of my surgery, patient denied chest pain or shortness of breath.  Patient lives alone and describes himself as very active (until recently).   Current Activity/ Functional Status: Patient is independent with mobility/ambulation, transfers, ADL's, IADL's.   Zubrod Score: At the time of surgery this patient's most appropriate activity status/level should be  described as: []     0    Normal activity, no symptoms [x]     1    Restricted in physical strenuous activity but ambulatory, able to do out light work []     2    Ambulatory and capable of self care, unable to do work activities, up and about more than 50%  Of the time                            []     3    Only limited self care, in bed greater than 50% of waking hours []     4    Completely disabled, no self care, confined to bed or chair []     5    Moribund  Past Medical History:  Diagnosis Date   Diabetes mellitus without complication (HCC)    History of kidney stones     Past Surgical History:  Procedure Laterality Date   LITHOTRIPSY      Social History   Tobacco Use  Smoking Status Never  Smokeless Tobacco Never    Social History   Substance and Sexual Activity  Alcohol Use Yes   Alcohol/week: 7.0 standard drinks of alcohol   Types: 7 Cans of beer per week     Allergies  Allergen Reactions   Statins Diarrhea    Current Facility-Administered Medications  Medication Dose Route Frequency Provider Last Rate Last Admin   0.9 %  sodium chloride  infusion  250 mL Intravenous PRN Rolan Ezra RAMAN, MD  acetaminophen  (TYLENOL ) tablet 650 mg  650 mg Oral Q4H PRN McLean, Dalton S, MD       aspirin  EC tablet 81 mg  81 mg Oral Daily Arrien, Elidia Sieving, MD       digoxin  (LANOXIN ) tablet 0.125 mg  0.125 mg Oral Daily McLean, Dalton S, MD       empagliflozin  (JARDIANCE ) tablet 10 mg  10 mg Oral Daily Arrien, Elidia Sieving, MD   10 mg at 09/08/23 1055   feeding supplement (NEPRO CARB STEADY) liquid 237 mL  237 mL Oral Daily Arrien, Mauricio Daniel, MD   237 mL at 09/08/23 1232   heparin  ADULT infusion 100 units/mL (25000 units/250mL)  1,050 Units/hr Intravenous Continuous Lendell Latanya BIRCH, [facility]   Stopped at 09/09/23 1015   hydrALAZINE  (APRESOLINE ) injection 10 mg  10 mg Intravenous Q20 Min PRN McLean, Dalton S, MD       losartan  (COZAAR ) tablet 25 mg  25 mg Oral Daily Arrien,  Elidia Sieving, MD   25 mg at 09/08/23 1055   multivitamin with minerals tablet 1 tablet  1 tablet Oral Daily Arrien, Elidia Sieving, MD   1 tablet at 09/08/23 1231   ondansetron  (ZOFRAN ) injection 4 mg  4 mg Intravenous Q6H PRN McLean, Dalton S, MD       sodium chloride  flush (NS) 0.9 % injection 3 mL  3 mL Intravenous Q12H Rolan Ezra RAMAN, MD       sodium chloride  flush (NS) 0.9 % injection 3 mL  3 mL Intravenous PRN Rolan Ezra RAMAN, MD       spironolactone  (ALDACTONE ) tablet 25 mg  25 mg Oral Daily McLean, Dalton S, MD       tamsulosin  (FLOMAX ) capsule 0.4 mg  0.4 mg Oral PC supper Georgina Basket, MD   0.4 mg at 09/08/23 1841    Medications Prior to Admission  Medication Sig Dispense Refill Last Dose/Taking   aspirin  81 MG tablet Take 81 mg by mouth daily.   09/05/2023 Morning   lisinopril  (PRINIVIL ,ZESTRIL ) 5 MG tablet Take 5 mg by mouth every evening.    09/05/2023   metFORMIN (GLUCOPHAGE) 500 MG tablet Take 500 mg by mouth 2 (two) times daily with a meal.   09/05/2023   silodosin (RAPAFLO) 8 MG CAPS capsule Take 8 mg by mouth daily.   09/05/2023   zolpidem (AMBIEN) 10 MG tablet Take 10 mg by mouth at bedtime.   09/04/2023   Family History: Mother-deceased in her 32's, had CHF Father-deceased in his 87's, he had CABG   Review of Systems:     Cardiac Review of Systems: Y or  [  N  ]= no  Chest Pain [  Y  ]   Exertional SOB  [ Y ]  Orthopnea [ Y ]   Pedal Edema [  N ]    Syncope  Discordia.Diesel  ]     General Review of Systems: [Y] = yes [ N ]=no Constitional: fatigue [ Y ]; nausea [ N ]; night sweats [ N ];                                                            Eye :  Amaurosis fugax[  N]; Resp: cough [ N ];  wheezing[ N ];  hemoptysis[  N];  GI:  vomiting[ N ];   melena[ N ];  hematochezia [ N ];  GU: hematuria[ N ];                Skin: rash, swelling[ N ];,   Heme/Lymph:  anemia[ N ];  Neuro:  stroke[ N ];  difficulty walking [ N ];  Endocrine: diabetes[ Y ];              Physical  Exam: BP 99/79   Pulse 92   Temp (!) 97.5 F (36.4 C) (Oral)   Resp 16   Ht 5' 6 (1.676 m)   Wt 59.9 kg   SpO2 97%   BMI 21.32 kg/m    General appearance: alert, cooperative, and no distress Head: Normocephalic, without obvious abnormality, atraumatic Neck: no JVD and supple, symmetrical, trachea midline Resp: clear to auscultation bilaterally Cardio: RRR, no murmur GI: Soft, non tender, bowel sounds present Extremities: No LE edema Neurologic: Grossly normal  Diagnostic Studies & Laboratory data:     Recent Radiology Findings:   CARDIAC CATHETERIZATION Result Date: 09/09/2023   Prox RCA lesion is 50% stenosed.   Mid RCA lesion is 50% stenosed.   1st Mrg lesion is 50% stenosed.   Dist LM to Prox LAD lesion is 99% stenosed.   3rd Mrg lesion is 60% stenosed. 1. Normal RA pressure. 2. Mildly elevated PCWP. 3. Mild pulmonary venous hypertension. 4. Low cardiac index, 2.04. 5. The ostial LAD is severely diseased and heavily calcified.  There is 99% stenosis with TIMI 2 flow down the LAD.  There are weak collaterals from RCA to LAD.  I reviewed films with Dr. Wonda.  There is no good landing zone for stenting of ostial LAD, would require involvement of distal left main with possible jeopardization of the LCx.  Patient had cardiac MRI today, will review viability of the LAD territory.  If LAD territory has significant viability, CABG (?off-pump LIMA-LAD) may be our best option.   Diagnostic Dominance: Co-dominant   ECHOCARDIOGRAM COMPLETE BUBBLE STUDY Result Date: 09/07/2023    ECHOCARDIOGRAM REPORT   Patient Name:   Joel Pitts Date of Exam: 09/07/2023 Medical Rec #:  [id number]        Height:       66.0 in Accession #:    [id number]       Weight:       147.0 lb Date of Birth:  08/04/1950        BSA:          1.755 m Patient Age:    73 years         BP:           114/89 mmHg Patient Gender: M                HR:           102 bpm. Exam Location:  Inpatient Procedure: 2D Echo, Color  Doppler, Cardiac Doppler, Saline Contrast Bubble Study            and Intracardiac Opacification Agent (Both Spectral and Color Flow            Doppler were utilized during procedure). Indications:    Elevated BNP  History:        Patient has no prior history of Echocardiogram examinations.                 CHF; Risk Factors:Diabetes and Hypertension.  Sonographer:    [employer]  Mbomeh Referring Phys: [id number] MARSHA ADA IMPRESSIONS  1. 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  2. Left ventricular ejection fraction, by estimation, is <20%. The left ventricle has severely decreased function. The left ventricle demonstrates global hypokinesis. Left ventricular diastolic parameters are consistent with Grade II diastolic dysfunction (pseudonormalization). Elevated left atrial pressure.  3. Right ventricular systolic function is mildly reduced. The right ventricular size is normal.  4. Left atrial size was moderately dilated.  5. The mitral valve is normal in structure. Trivial mitral valve regurgitation. No evidence of mitral stenosis.  6. The aortic valve is tricuspid. Aortic valve regurgitation is not visualized. No aortic stenosis is present.  7. The inferior vena cava is normal in size with greater than 50% respiratory variability, suggesting right atrial pressure of 3 mmHg.  8. Agitated saline contrast bubble study was negative, with no evidence of any interatrial shunt. Comparison(s): No prior Echocardiogram. FINDINGS  Left Ventricle: Left ventricular ejection fraction, by estimation, is <20%. The left ventricle has severely decreased function. The left ventricle demonstrates global hypokinesis. Definity  contrast agent was given IV to delineate the left ventricular endocardial borders. The left ventricular internal cavity size was normal in size. There is no left ventricular hypertrophy. Left ventricular diastolic parameters are consistent  with Grade II diastolic dysfunction (pseudonormalization). Elevated left atrial pressure. Right Ventricle: The right ventricular size is normal. Right ventricular systolic function is mildly reduced. Left Atrium: Left atrial size was moderately dilated. Right Atrium: Right atrial size was normal in size. Pericardium: There is no evidence of pericardial effusion. Mitral Valve: The mitral valve is normal in structure. Mild mitral annular calcification. Trivial mitral valve regurgitation. No evidence of mitral valve stenosis. Tricuspid Valve: The tricuspid valve is normal in structure. Tricuspid valve regurgitation is trivial. No evidence of tricuspid stenosis. Aortic Valve: The aortic valve is tricuspid. Aortic valve regurgitation is not visualized. No aortic stenosis is present. Aortic valve mean gradient measures 2.0 mmHg. Aortic valve peak gradient measures 3.1 mmHg. Aortic valve area, by VTI measures 2.37 cm. Pulmonic Valve: The pulmonic valve was normal in structure. Pulmonic valve regurgitation is mild. No evidence of pulmonic stenosis. Aorta: The aortic root is normal in size and structure. Venous: The inferior vena cava is normal in size with greater than 50% respiratory variability, suggesting right atrial pressure of 3 mmHg. IAS/Shunts: No atrial level shunt detected by color flow Doppler. Agitated saline contrast was given intravenously to evaluate for intracardiac shunting. Agitated saline contrast bubble study was negative, with no evidence of any interatrial shunt. 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  LEFT VENTRICLE PLAX 2D LVIDd:         5.10 cm     Diastology LVIDs:         4.20 cm     LV e' medial:    3.32 cm/s LV PW:         0.80 cm     LV E/e' medial:  21.6 LV IVS:        0.80 cm     LV e' lateral:   6.24 cm/s LVOT diam:     2.10 cm     LV E/e' lateral: 11.5 LV SV:         34 LV SV Index:   19 LVOT  Area:     3.46 cm  LV Volumes (MOD) LV vol  d, MOD A2C: 72.8 ml LV vol d, MOD A4C: 84.8 ml LV vol s, MOD A2C: 63.3 ml LV vol s, MOD A4C: 62.8 ml LV SV MOD A2C:     9.5 ml LV SV MOD A4C:     84.8 ml LV SV MOD BP:      18.8 ml RIGHT VENTRICLE RV S prime:     8.68 cm/s TAPSE (M-mode): 1.5 cm LEFT ATRIUM             Index        RIGHT ATRIUM           Index LA Vol (A2C):   46.9 ml 26.73 ml/m  RA Area:     13.30 cm LA Vol (A4C):   63.5 ml 36.19 ml/m  RA Volume:   31.30 ml  17.84 ml/m LA Biplane Vol: 55.4 ml 31.58 ml/m  AORTIC VALVE                    PULMONIC VALVE AV Area (Vmax):    2.49 cm     PR End Diast Vel: 14.44 msec AV Area (Vmean):   2.32 cm AV Area (VTI):     2.37 cm AV Vmax:           87.50 cm/s AV Vmean:          65.500 cm/s AV VTI:            0.144 m AV Peak Grad:      3.1 mmHg AV Mean Grad:      2.0 mmHg LVOT Vmax:         62.90 cm/s LVOT Vmean:        43.900 cm/s LVOT VTI:          0.098 m LVOT/AV VTI ratio: 0.68  AORTA Ao Root diam: 2.60 cm Ao Asc diam:  2.60 cm MITRAL VALVE MV Area (PHT): 8.72 cm    SHUNTS MV Decel Time: 87 msec     Systemic VTI:  0.10 m MV E velocity: 71.60 cm/s  Systemic Diam: 2.10 cm MV A velocity: 42.40 cm/s MV E/A ratio:  1.69 Redell Shallow MD Electronically signed by Redell Shallow MD Signature Date/Time: 09/07/2023/3:06:44 PM    Final      I have independently reviewed the above radiologic studies and discussed with the patient   Recent Lab Findings: Lab Results  Component Value Date   WBC 8.2 09/09/2023   HGB 19.2 (H) 09/09/2023   HCT 53.7 (H) 09/09/2023   PLT 175 09/09/2023   GLUCOSE 160 (H) 09/09/2023   CHOL 160 09/08/2023   TRIG 132 09/08/2023   HDL 46 09/08/2023   LDLCALC 88 09/08/2023   NA 136 09/09/2023   K 3.8 09/09/2023   CL 98 09/09/2023   CREATININE 1.57 (H) 09/09/2023   BUN 27 (H) 09/09/2023   CO2 24 09/09/2023   Assessment / Plan:   Coronary artery disease (significant distal LM to proximal LAD) with LVEF <20% by echocardiogram,  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 If viable, likely OR Monday. Dr. Shyrl to review films and evaluate later today Elevated creatinine-creatinine today 1.57-unsure of baseline creatinine 3. History of diabetes mellitus-await HGA1C. He was on Metformin 500 mg bid prior to admission 4. Acute systolic heart failure-on Spironolactone  12.5 mg daily, Losartan  25 mg daily, and Jardiance  10 mg daily 5. History of hypertension-he was on Lisinopril  prior to admission 6. History of BPH-on Flomax  prior to admission  7. Elevated Hgb/Hct-19.2/53.7 respectively  I  spent 25 minutes counseling the patient face to face.   Kyla Donald PA-C 09/09/2023 12:36 PM   Images reviewed Awaiting viability study.  LAD appears intramyocardial, thus off-pump CABG may be challenging if he has viability.    Linnie MALVA Rayas, MD

## 2023-09-09 NOTE — Progress Notes (Signed)
 PHARMACY - ANTICOAGULATION CONSULT NOTE  Pharmacy Consult for heparin  Indication: Echo showing apical clot  Allergies  Allergen Reactions   Statins Diarrhea    Patient Measurements: Height: 5' 6 (167.6 cm) Weight: 59.9 kg (132 lb 1.6 oz) IBW/kg (Calculated) : 63.8 HEPARIN  DW (KG): 63.5  Vital Signs: Temp: 97.6 F (36.4 C) (07/11 0829) Temp Source: Oral (07/11 0829) BP: 113/84 (07/11 0829) Pulse Rate: 97 (07/11 0829)  Labs: Recent Labs    09/06/23 1007 09/07/23 0232 09/08/23 0421 09/08/23 2121 09/09/23 0810  HGB  --   --   --   --  19.2*  HCT  --   --   --   --  53.7*  PLT  --   --   --   --  175  HEPARINUNFRC  --   --   --  <0.10* 0.48  CREATININE  --  1.41* 1.51*  --   --   TROPONINIHS 25*  --   --   --   --     Estimated Creatinine Clearance: 36.4 mL/min (A) (by C-G formula based on SCr of 1.51 mg/dL (H)).   Assessment: 73 yo male with HF and apical clot on ECHO. Pharmacy consulted to dose IV heparin .   Heparin  level 0.48 is therapeutic on 1050 units/hr.  CBC stable.  No issues with heparin  infusion nor bleeding per discussion from RN.  Goal of Therapy:  Heparin  level 0.3-0.7 units/ml Monitor platelets by anticoagulation protocol: Yes   Plan:  Continue heparin  IV 1050 units/hr  Daily heparin  level, CBC Monitor s/sx bleeding F/u after cath today  Maurilio Fila, PharmD Clinical Pharmacist 09/09/2023  8:49 AM

## 2023-09-09 NOTE — Progress Notes (Signed)
 PHARMACY - ANTICOAGULATION CONSULT NOTE  Pharmacy Consult for heparin  Indication: Echo showing apical clot  Allergies  Allergen Reactions   Statins Diarrhea    Patient Measurements: Height: 5' 6 (167.6 cm) Weight: 59.9 kg (132 lb 1.6 oz) IBW/kg (Calculated) : 63.8 HEPARIN  DW (KG): 63.5  Vital Signs: Temp: 98.1 F (36.7 C) (07/11 1300) Temp Source: Oral (07/11 1300) BP: 91/67 (07/11 1300) Pulse Rate: 94 (07/11 1300)  Labs: Recent Labs    09/07/23 0232 09/08/23 0421 09/08/23 2121 09/09/23 0810  HGB  --   --   --  19.2*  HCT  --   --   --  53.7*  PLT  --   --   --  175  HEPARINUNFRC  --   --  <0.10* 0.48  CREATININE 1.41* 1.51*  --  1.57*    Estimated Creatinine Clearance: 35 mL/min (A) (by C-G formula based on SCr of 1.57 mg/dL (H)).   Assessment: 73 yo male with HF and apical clot on ECHO. Pharmacy consulted to dose IV heparin .   Heparin  was therapeutic this morning at 0.48, on 1050 units/hr. Underwent cardiac cath finding severely disease LAD with weak collaterals - reviewed with interventional and now being evaluated by CTVS for possible CABG candidate. Okay to restart heparin  2 hours after TR band removed (documented on 7/11@1330 ).   Goal of Therapy:  Heparin  level 0.3-0.7 units/ml Monitor platelets by anticoagulation protocol: Yes   Plan:  Restart heparin  infusion at 1050 units/hr on 7/11@1530  Will order 8 hr levels with AM labs Daily heparin  level, CBC Monitor s/sx bleeding  Thank you for allowing pharmacy to participate in this patient's care,  Suzen Sour, PharmD, BCCCP Clinical Pharmacist  Phone: [phone] 09/09/2023 3:12 PM  Please check AMION for all [facility] Pharmacy phone numbers After 10:00 PM, call Main Pharmacy [phone]

## 2023-09-09 NOTE — Progress Notes (Addendum)
 Advanced Heart Failure Rounding Note  Cardiologist: None  Chief Complaint: acute systolic heart failure Subjective:    Feels fine this morning. Just returned from cMRI. Plan for [facility] this morning.   -2.6L UOP with IV lasix . BMET pending.   Objective:   Weight Range: 59.9 kg Body mass index is 21.32 kg/m.   Vital Signs:   Temp:  [97.6 F (36.4 C)-98.3 F (36.8 C)] 97.6 F (36.4 C) (07/11 0829) Pulse Rate:  [97-117] 97 (07/11 0829) Resp:  [15-20] 15 (07/11 0829) BP: (91-114)/(63-84) 113/84 (07/11 0829) SpO2:  [93 %-96 %] 94 % (07/11 0829) Weight:  [59.9 kg] 59.9 kg (07/11 0614) Last BM Date : 09/08/23  Weight change: Filed Weights   09/06/23 0743 09/07/23 1135 09/09/23 0614  Weight: 66.7 kg 63.5 kg 59.9 kg    Intake/Output:   Intake/Output Summary (Last 24 hours) at 09/09/2023 0905 Last data filed at 09/09/2023 9364 Gross per 24 hour  Intake 808.83 ml  Output 2650 ml  Net -1841.17 ml      Physical Exam    General:  elderly appearing.  No respiratory difficulty Neck: supple. JVD flat.  Cor: PMI nondisplaced. Regular rate & rhythm. No rubs, gallops or murmurs. Lungs: clear Extremities: no cyanosis, clubbing, rash, edema  Neuro: alert & oriented x 3. Moves all 4 extremities w/o difficulty. Affect pleasant.   Telemetry   NSR 90s (Personally reviewed)    EKG    No new EKG to revew  Labs    CBC Recent Labs    09/09/23 0810  WBC 8.2  HGB 19.2*  HCT 53.7*  MCV 91.8  PLT 175   Basic Metabolic Panel Recent Labs    92/90/74 0232 09/08/23 0421  NA 131* 134*  K 4.0 4.4  CL 98 100  CO2 23 20*  GLUCOSE 145* 149*  BUN 17 19  CREATININE 1.41* 1.51*  CALCIUM 8.6* 8.6*  MG  --  2.0   Liver Function Tests No results for input(s): AST, ALT, ALKPHOS, BILITOT, PROT, ALBUMIN  in the last 72 hours. No results for input(s): LIPASE, AMYLASE in the last 72 hours. Cardiac Enzymes No results for input(s): CKTOTAL, CKMB, CKMBINDEX,  TROPONINI in the last 72 hours.  BNP: BNP (last 3 results) Recent Labs    09/06/23 0856  BNP 2,251.1*    ProBNP (last 3 results) No results for input(s): PROBNP in the last 8760 hours.   D-Dimer Recent Labs    09/06/23 1130  DDIMER 1.89*   Hemoglobin A1C No results for input(s): HGBA1C in the last 72 hours. Fasting Lipid Panel Recent Labs    09/08/23 1303  CHOL 160  HDL 46  LDLCALC 88  TRIG 132  CHOLHDL 3.5   Thyroid Function Tests No results for input(s): TSH, T4TOTAL, T3FREE, THYROIDAB in the last 72 hours.  Invalid input(s): FREET3  Other results:   Imaging    No results found.   Medications:     Scheduled Medications:  aspirin  EC  81 mg Oral Daily   empagliflozin   10 mg Oral Daily   feeding supplement (NEPRO CARB STEADY)  237 mL Oral Daily   losartan   25 mg Oral Daily   multivitamin with minerals  1 tablet Oral Daily   spironolactone   25 mg Oral Daily   tamsulosin   0.4 mg Oral PC supper    Infusions:  sodium chloride      heparin  1,050 Units/hr (09/09/23 0809)    PRN Medications:     Patient Profile  MACALLAN ORD is a 73 y.o. male with DMII and nephrolithiasis. AHF team to see for acute systolic heart failure.   Assessment/Plan  Acute systolic heart failure - Echo 2/0/74: EF <20%, possible layered thrombus, LV with GHK, GIIDD, RV mildly reduced, LA mod reduced, trivial MR  - NYHA IV on admission - Appears near euvolemia. Now off IV lasix . Will reassess post cath.  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  May need genetic testing OP. EKG changes noted, will need to r/o CAD - Continue Jardiance  10 mg daily - Continue Losartan  25 mg daily, plan for [facility] tomorrow if renal function stable - Increase spiro 12.5>25 mg daily - BB post cath - Strict I&O,daily weights - Plan for [facility] today to r/o CAD and check R sided pressures.  - cMRI this morning to further evaluate possible thrombus seen on echo and r/o infiltrative  heart disease. Read pending.    DM II - check Hgb A1c  - per primary   Chest pressure - chest pressure reported on admission - HsTrop 29>25 - EKG with T wave abnormality - Denies current or previous chest pain - LDL 88, intolerance to statin noticed. Will discuss with PharmD.  - Continue ASA - Plan for [facility] today   LV thrombus  - possible layered apical thrombus seen on echo - Continue heparin  gtt, suspect he will need eliquis with low EF. Pending cMRI read.    Elevated SCr - Unknown baseline - follow with diuresis and addition of GDMT - Avoid hypotension - BMET pending this morning.     Length of Stay: 3  Beckey LITTIE Coe, NP  09/09/2023, 9:05 AM  Advanced Heart Failure Team Pager [phone] (M-F; 7a - 5p)  Please contact [facility] for night-coverage after hours (5p -7a ) and weekends on [email or web address]   Patient seen with NP, I formulated the plan and agree with the above note.    Breathing is better with diuresis, no chest pain.   LHC/RHC done today:  Coronary Findings  Diagnostic Dominance: Co-dominant Left Main  Dist LM to Prox LAD lesion is 99% stenosed. TIMI 2 flow down LAD with weak collaterals from RCA. Heavily calcified ostial LAD.    Left Anterior Descending  Collaterals  Dist LAD filled by collaterals from RPDA.      Left Circumflex    First Obtuse Marginal Branch  1st Mrg lesion is 50% stenosed.    Third Obtuse Marginal Branch  3rd Mrg lesion is 60% stenosed.    Right Coronary Artery  Prox RCA lesion is 50% stenosed.  Mid RCA lesion is 50% stenosed.    Intervention   No interventions have been documented.   Right Heart  Right Heart Pressures RHC Procedural Findings: Hemodynamics (mmHg) RA mean 3 RV 40/7 PA 39/15, mean 25 PCWP mean 17 LV 97/22 AO 93/54  Oxygen saturations: PA 71% AO 96%  Cardiac Output (Fick) 3.42  Cardiac Index (Fick) 2.04 PVR 2.3 WU   General: NAD Neck: No JVD, no thyromegaly or thyroid nodule.  Lungs:  Clear to auscultation bilaterally with normal respiratory effort. CV: Nondisplaced PMI.  Heart regular S1/S2, no S3/S4, no murmur.  No peripheral edema.   Abdomen: Soft, nontender, no hepatosplenomegaly, no distention.  Skin: Intact without lesions or rashes.  Neurologic: Alert and oriented x 3.  Psych: Normal affect. Extremities: No clubbing or cyanosis.  HEENT: Normal.   1. Acute systolic CHF: Symptoms for about 2 wks, no preceeding chest pain or viral syndrome.  Has  history of CHF in mother. Echo showed EF 20%, diffuse hypokinesis, mild RV dysfunction, cannot rule out apical thrombus on contrast images.  HS-TnI minimally elevated at admission with no trend, suspect mildly elevated due to demand ischemia (no ACS).  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  Based on cath today, this is an ischemic cardiomyopathy.  99% stenosis of the ostial LAD with TIMI 2 flow down the vessel and weak R>L collaterals.  Suspect unrecognized anterior MI about 2 wks ago with development of CHF.  RHC today with low (but not markedly low) CI at 2.04, mildly elevated PCWP but normal filling pressures. Creatinine 1.51 => 1.57 with diuresis.   - No further Lasix  today, would start po tomorrow if creatinine stable.  - Add digoxin  0.125 with low CO.   - Continue losartan  25 mg daily, he does not have BP room for transition to Entresto.  - Can increase spironolactone  to 25 mg daily.  - Continue Jardiance  10 daily.  - Will need to make decision on revascularization of LAD, see below.  - Cardiac MRI today, will need to assess for presence of LV thrombus and also assess degree of anterior wall viability to help with decision-making for revascularization.  2. CAD: Suspect unrecognized anterior MI 2+ weeks ago.  Coronary angiography today showed that the ostial LAD is severely diseased and heavily calcified. There is 99% stenosis with TIMI 2 flow down the LAD. There are weak collaterals from RCA to LAD. I reviewed films with Dr. Wonda. There is no  good landing zone for stenting of ostial LAD, would require involvement of distal left main with possible jeopardization of the LCx. Patient had cardiac MRI today, will review viability of the LAD territory. If LAD territory has significant viability, CABG (?off-pump LIMA-LAD) may be our best option.  - Consult TCTS.  - Continue heparin  for now with concern for LV thrombus (review cMRI).  - Continue ASA 81 - Add Crestor 20 daily.  2. Elevated creatinine: 1.51 => 1.57 today.  ?CKD at baseline with DM2 and HTN. Follow closely.  - Now on Jardiance  10 mg daily.  3. Possible apical thrombus by echo: Only noted on contrast images and somewhat equivocal.  - On heparin  gtt for now, will review cardiac MRI.   Ezra Shuck 09/09/2023 12:23 PM

## 2023-09-09 NOTE — TOC Initial Note (Signed)
 Transition of Care [facility]) - Initial/Assessment Note    Patient Details  Name: Joel Pitts MRN: [id number] Date of Birth: 02/18/1951  Transition [facility]) CM/SW Contact:    Sudie Erminio Deems, RN Phone Number: 09/09/2023, 11:07 AM  Clinical Narrative:  Patient presented for chest pain and shortness of breath. PTA patient reports that he was from home alone. Patient states he does not use any DME. Patient has PCP and gets to appointments without any issues. Patient plan for [facility] today. Case Manager will continue to follow for additional needs as the patient progresses.               Expected Discharge Plan: Home/Self Care Barriers to Discharge: Continued Medical Work up   Patient Goals and CMS Choice Patient states their goals for this hospitalization and ongoing recovery are:: Plan to transition home once stable   Choice offered to / list presented to : NA      Expected Discharge Plan and Services In-house Referral: NA Discharge Planning Services: CM Consult Post Acute Care Choice: NA Living arrangements for the past 2 months: Single Family Home                   DME Agency: NA                  Prior Living Arrangements/Services Living arrangements for the past 2 months: Single Family Home Lives with:: Self Patient language and need for interpreter reviewed:: Yes Do you feel safe going back to the place where you live?: Yes      Need for Family Participation in Patient Care: No (Comment) Care giver support system in place?: No (comment)   Criminal Activity/Legal Involvement Pertinent to Current Situation/Hospitalization: No - Comment as needed  Activities of Daily Living   ADL Screening (condition at time of admission) Independently performs ADLs?: Yes (appropriate for developmental age) Is the patient deaf or have difficulty hearing?: No Does the patient have difficulty seeing, even when wearing glasses/contacts?: No Does the patient have  difficulty concentrating, remembering, or making decisions?: No  Permission Sought/Granted Permission sought to share information with : Family Supports, Case Manager                Emotional Assessment Appearance:: Appears stated age Attitude/Demeanor/Rapport: Engaged Affect (typically observed): Appropriate Orientation: : Oriented to Self, Oriented to Place, Oriented to  Time, Oriented to Situation Alcohol / Substance Use: Not Applicable Psych Involvement: No (comment)  Admission diagnosis:  Acute on chronic heart failure (HCC) [I50.9] Acute congestive heart failure, unspecified heart failure type [facility]) [I50.9] Patient Active Problem List   Diagnosis Date Noted   Protein-calorie malnutrition, severe 09/08/2023   Type 2 diabetes mellitus (HCC) 09/07/2023   AKI (acute kidney injury) (HCC) 09/07/2023   Essential hypertension 09/07/2023   BPH (benign prostatic hyperplasia) 09/07/2023   Acute on chronic systolic CHF (congestive heart failure) (HCC) 09/06/2023   PCP:  Nanci Senior, MD Pharmacy:   [phone] - [place], [place] - [address] AT [address] [facility] [address] [address] [place] [address] Phone: [phone] Fax: [phone]  [id number] - [address], [place] - [address] [place] [address] Phone: [phone] Fax: [phone]     Social Drivers of Health (SDOH) Social History: SDOH Screenings   Food Insecurity: No Food Insecurity (09/07/2023)  Housing: Low Risk  (09/07/2023)  Transportation Needs: No Transportation Needs (09/07/2023)  Utilities: Not At Risk (09/07/2023)  Social Connections: Unknown (  09/07/2023)  Tobacco Use: Low Risk  (09/06/2023)   SDOH Interventions:     Readmission Risk Interventions     No data to display

## 2023-09-09 NOTE — Progress Notes (Signed)
  Progress Note   Patient: Joel Pitts FMW:[id number] DOB: [birth year] DOA: 09/06/2023     3 DOS: the patient was seen and examined on 09/09/2023   Brief hospital course: Patient is a 73 year old male with past medical history significant for type 2 diabetes mellitus and nephrolithiasis.  He was admitted with heart failure.  Echocardiogram revealed EF of less than 20%.  Heart failure team is directing care.  Patient has undergone cardiac MRI and cardiac catheterization today.  Further management as per heart failure team.  Assessment and Plan: Acute on chronic systolic CHF (congestive heart failure) (HCC): -Echocardiogram with decreased LV systolic function with EF < 20%, global hypokinesis, RV systolic function with mild reduction, LA with moderate dilatation, no significant valvular disease.  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  - Heart failure team is directing care.  Patient underwent cardiac MRI and cardiac catheterization. -Strict I's and O's. - Symptoms are improving. -Continue diuretics, ARB, Aldactone  and SGLT2 inhibitor.  Patient is also anticoagulated.    BPH (benign prostatic hyperplasia) Continue with flomax , no signs of acute urinary retention.   Essential hypertension Continue blood pressure control with losartan , possible transition to entresto   AKI (acute kidney injury) (HCC) versus CKD 3A: - Baseline serum creatinine not visualized. - Serum creatinine stable at 1.5-1.57. - Continue to monitor closely with diuresis.  Hyponatremia: - Volume related. - Resolved.  Sodium of 136.    Type 2 diabetes mellitus (HCC) Continue glucose cover and monitoring with insulin  sliding scale Hyperglycemia   Continue SGLT 2 inh, will resume metformin at the time of discharge.      Subjective: Patient with improvement in dyspnea and orthopnea, no chest pain, no dizziness or lightheadedness.   Physical Exam: Vitals:   09/09/23 1300 09/09/23 1651 09/09/23 1928 09/09/23 2008   BP: 91/67 102/71  103/68  Pulse: 94 (!) 102 96 98  Resp: 16 18  20   Temp: 98.1 F (36.7 C) 98.7 F (37.1 C)  98.3 F (36.8 C)  TempSrc: Oral Oral  Oral  SpO2: 91% 96% 92% 96%  Weight:      Height:       General Condition: Not in any distress.  Awake and alert. HEENT: Mild pallor. Neck: Supple. Lungs: Clear to auscultation. CVS: S1-S2. Abdomen: Soft and nontender. Neuro: Awake and alert. Extremities no edema.   Data Reviewed:    Family Communication: no family at the bedside   Disposition: Status is: Inpatient Remains inpatient appropriate because: Heart failure workup   Planned Discharge Destination: Home    Author: Leatrice LILLETTE Chapel, MD 09/09/2023 8:44 PM  For on call review [email or web address].

## 2023-09-10 ENCOUNTER — Other Ambulatory Visit (HOSPITAL_COMMUNITY)

## 2023-09-10 DIAGNOSIS — I255 Ischemic cardiomyopathy: Secondary | ICD-10-CM | POA: Diagnosis not present

## 2023-09-10 DIAGNOSIS — I2510 Atherosclerotic heart disease of native coronary artery without angina pectoris: Secondary | ICD-10-CM | POA: Diagnosis not present

## 2023-09-10 DIAGNOSIS — I5023 Acute on chronic systolic (congestive) heart failure: Secondary | ICD-10-CM | POA: Diagnosis not present

## 2023-09-10 LAB — BASIC METABOLIC PANEL WITH GFR
Anion gap: 11 (ref 5–15)
BUN: 25 mg/dL — ABNORMAL HIGH (ref 8–23)
CO2: 25 mmol/L (ref 22–32)
Calcium: 8.8 mg/dL — ABNORMAL LOW (ref 8.9–10.3)
Chloride: 101 mmol/L (ref 98–111)
Creatinine, Ser: 1.34 mg/dL — ABNORMAL HIGH (ref 0.61–1.24)
GFR, Estimated: 56 mL/min — ABNORMAL LOW (ref >60)
Glucose, Bld: 169 mg/dL — ABNORMAL HIGH (ref 70–99)
Potassium: 3.6 mmol/L (ref 3.5–5.1)
Sodium: 137 mmol/L (ref 135–145)

## 2023-09-10 LAB — CBC
HCT: 52.5 % — ABNORMAL HIGH (ref 39.0–52.0)
Hemoglobin: 18.3 g/dL — ABNORMAL HIGH (ref 13.0–17.0)
MCH: 32.6 pg (ref 26.0–34.0)
MCHC: 34.9 g/dL (ref 30.0–36.0)
MCV: 93.4 fL (ref 80.0–100.0)
Platelets: 164 K/uL (ref 150–400)
RBC: 5.62 MIL/uL (ref 4.22–5.81)
RDW: 12.6 % (ref 11.5–15.5)
WBC: 7.6 K/uL (ref 4.0–10.5)
nRBC: 0 % (ref 0.0–0.2)

## 2023-09-10 LAB — HEPARIN LEVEL (UNFRACTIONATED): Heparin Unfractionated: 0.67 [IU]/mL (ref 0.30–0.70)

## 2023-09-10 NOTE — Plan of Care (Signed)
  Problem: Education: Goal: Understanding of CV disease, CV risk reduction, and recovery process will improve 09/10/2023 1210 by Pearly Doffing, RN Outcome: Progressing 09/10/2023 1209 by Pearly Doffing, RN Outcome: Progressing Goal: Individualized Educational Video(s) 09/10/2023 1210 by Pearly Doffing, RN Outcome: Progressing 09/10/2023 1209 by Pearly Doffing, RN Outcome: Progressing   Problem: Activity: Goal: Ability to return to baseline activity level will improve 09/10/2023 1210 by Pearly Doffing, RN Outcome: Progressing 09/10/2023 1209 by Pearly Doffing, RN Outcome: Progressing   Problem: Cardiovascular: Goal: Ability to achieve and maintain adequate cardiovascular perfusion will improve 09/10/2023 1210 by Pearly Doffing, RN Outcome: Progressing 09/10/2023 1209 by Pearly Doffing, RN Outcome: Progressing Goal: Vascular access site(s) Level 0-1 will be maintained 09/10/2023 1210 by Pearly Doffing, RN Outcome: Progressing 09/10/2023 1209 by Pearly Doffing, RN Outcome: Progressing   Problem: Health Behavior/Discharge Planning: Goal: Ability to safely manage health-related needs after discharge will improve 09/10/2023 1210 by Pearly Doffing, RN Outcome: Progressing 09/10/2023 1209 by Pearly Doffing, RN Outcome: Progressing

## 2023-09-10 NOTE — Progress Notes (Signed)
 PHARMACY - ANTICOAGULATION CONSULT NOTE  Pharmacy Consult for heparin  Indication: Echo showing apical clot  Allergies  Allergen Reactions   Statins Diarrhea    Patient Measurements: Height: 5' 6 (167.6 cm) Weight: 60.4 kg (133 lb 3.2 oz) IBW/kg (Calculated) : 63.8 HEPARIN  DW (KG): 63.5  Vital Signs: Temp: (P) 98.1 F (36.7 C) (07/12 0813) Temp Source: (P) Oral (07/12 0813) BP: (P) 108/78 (07/12 0813) Pulse Rate: 108 (07/12 0813)  Labs: Recent Labs    09/08/23 0421 09/08/23 2121 09/09/23 0810 09/09/23 0810 09/09/23 1111 09/10/23 0447  HGB  --   --  19.2*   < > 17.3*  17.0 18.3*  HCT  --   --  53.7*  --  51.0  50.0 52.5*  PLT  --   --  175  --   --  164  HEPARINUNFRC  --  <0.10* 0.48  --   --  0.67  CREATININE 1.51*  --  1.57*  --   --  1.34*   < > = values in this interval not displayed.    Estimated Creatinine Clearance: 41.3 mL/min (A) (by C-G formula based on SCr of 1.34 mg/dL (H)).   Assessment: 73 yo male with HF and apical clot on ECHO. Pharmacy consulted to dose IV heparin .  -CBC stable. -Heparin  was therapeutic this AM 7/12 at 0.67, on 1050 units/hr.  Goal of Therapy:  Heparin  level 0.3-0.7 units/ml Monitor platelets by anticoagulation protocol: Yes   Plan:  Continue heparin  infusion at 1050 units/hr Daily heparin  level, CBC Monitor s/sx bleeding  Thank you for allowing pharmacy to participate in this patient's care,  Maurilio Patten, PharmD PGY1 Pharmacy Resident [facility]  09/10/2023 9:50 AM

## 2023-09-10 NOTE — Progress Notes (Signed)
  Progress Note   Patient: Joel Pitts FMW:[id number] DOB: 03-03-1950 DOA: 09/06/2023     4 DOS: the patient was seen and examined on 09/10/2023   Brief hospital course: Patient is a 73 year old male with past medical history significant for type 2 diabetes mellitus and nephrolithiasis.  He was admitted with heart failure.  Echocardiogram revealed EF of less than 20%.  Heart failure team is directing care.  Patient has undergone cardiac MRI and cardiac catheterization today.  Further management as per heart failure team.  09/10/2023: Patient seen.  No new complaints.  Cardiology input is appreciated.  Possible CABG on Monday, 09/12/2023.  Assessment and Plan: Acute on chronic systolic CHF (congestive heart failure) (HCC): -Echocardiogram with decreased LV systolic function with EF < 20%, global hypokinesis, RV systolic function with mild reduction, LA with moderate dilatation, no significant valvular disease.  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  - Heart failure team is directing care.  Patient underwent cardiac MRI and cardiac catheterization. -Likely CABG as per cardiology team. -Patient is on digoxin , Jardiance , losartan  and Aldactone . -Strict I's and O's. - Symptoms are improving.  BPH (benign prostatic hyperplasia) Continue with flomax , no signs of acute urinary retention.   Essential hypertension Continue blood pressure control with losartan , possible transition to entresto   AKI (acute kidney injury) (HCC) versus CKD 3A: - Baseline serum creatinine not visualized. - Serum creatinine today is 1.34.   - Continue to monitor closely with diuresis.  Hyponatremia: - Volume related. - Resolved.  Sodium of 137.    Type 2 diabetes mellitus (HCC) Continue glucose cover and monitoring with insulin  sliding scale Hyperglycemia   Continue SGLT 2 inh, will resume metformin at the time of discharge.      Subjective: Patient with improvement in dyspnea and orthopnea, no chest pain,  no dizziness or lightheadedness.   Physical Exam: Vitals:   09/10/23 0637 09/10/23 0813 09/10/23 1102 09/10/23 1547  BP:  108/78 114/73 115/78  Pulse: 100 (!) 108 100 100  Resp:  17 18 18   Temp:  98.1 F (36.7 C) (!) 97.4 F (36.3 C) 97.6 F (36.4 C)  TempSrc:  Oral Oral Oral  SpO2: 96% 98% 98% 97%  Weight:      Height:       General Condition: Not in any distress.  Awake and alert. HEENT: Mild pallor. Neck: Supple. Lungs: Clear to auscultation. CVS: S1-S2. Abdomen: Soft and nontender. Neuro: Awake and alert. Extremities no edema.   Data Reviewed:    Family Communication: no family at the bedside   Disposition: Status is: Inpatient Remains inpatient appropriate because: Heart failure workup   Planned Discharge Destination: Home    Author: Leatrice LILLETTE Chapel, MD 09/10/2023 6:16 PM  For on call review [email or web address].

## 2023-09-10 NOTE — Plan of Care (Signed)
  Problem: Education: Goal: Knowledge of General Education information will improve Description: Including pain rating scale, medication(s)/side effects and non-pharmacologic comfort measures Outcome: Progressing   Problem: Health Behavior/Discharge Planning: Goal: Ability to manage health-related needs will improve Outcome: Progressing   Problem: Clinical Measurements: Goal: Ability to maintain clinical measurements within normal limits will improve Outcome: Progressing Goal: Will remain free from infection Outcome: Progressing Goal: Diagnostic test results will improve Outcome: Progressing Goal: Respiratory complications will improve Outcome: Progressing Goal: Cardiovascular complication will be avoided Outcome: Progressing   Problem: Activity: Goal: Risk for activity intolerance will decrease Outcome: Progressing   Problem: Coping: Goal: Level of anxiety will decrease Outcome: Progressing   Problem: Elimination: Goal: Will not experience complications related to bowel motility Outcome: Progressing Goal: Will not experience complications related to urinary retention Outcome: Progressing   Problem: Pain Managment: Goal: General experience of comfort will improve and/or be controlled Outcome: Progressing   Problem: Safety: Goal: Ability to remain free from injury will improve Outcome: Progressing   Problem: Skin Integrity: Goal: Risk for impaired skin integrity will decrease Outcome: Progressing   Problem: Education: Goal: Understanding of CV disease, CV risk reduction, and recovery process will improve Outcome: Progressing Goal: Individualized Educational Video(s) Outcome: Progressing   Problem: Activity: Goal: Ability to return to baseline activity level will improve Outcome: Progressing   Problem: Cardiovascular: Goal: Ability to achieve and maintain adequate cardiovascular perfusion will improve Outcome: Progressing Goal: Vascular access site(s) Level  0-1 will be maintained Outcome: Progressing   Problem: Health Behavior/Discharge Planning: Goal: Ability to safely manage health-related needs after discharge will improve Outcome: Progressing

## 2023-09-10 NOTE — Progress Notes (Signed)
 Cardiologist:  Rolan  Subjective:    Lots of questions about CABG  Objective:   Weight Range: 60.4 kg Body mass index is 21.5 kg/m.   Vital Signs:   Temp:  [97.5 F (36.4 C)-98.7 F (37.1 C)] 98.4 F (36.9 C) (07/12 0449) Pulse Rate:  [88-107] 100 (07/12 0637) Resp:  [15-20] 20 (07/12 0449) BP: (91-121)/(58-84) 121/79 (07/12 0449) SpO2:  [87 %-98 %] 96 % (07/12 0637) Weight:  [60.4 kg] 60.4 kg (07/12 0449) Last BM Date : 09/08/23  Weight change: Filed Weights   09/07/23 1135 09/09/23 0614 09/10/23 0449  Weight: 63.5 kg 59.9 kg 60.4 kg    Intake/Output:   Intake/Output Summary (Last 24 hours) at 09/10/2023 0748 Last data filed at 09/10/2023 9360 Gross per 24 hour  Intake 386.11 ml  Output 1050 ml  Net -663.89 ml      Physical Exam    Affect appropriate Healthy:  appears stated age HEENT: normal Neck supple with no adenopathy JVP normal no bruits no thyromegaly Lungs clear with no wheezing and good diaphragmatic motion Heart:  S1/S2 no murmur, no rub, gallop or click PMI normal Abdomen: benighn, BS positve, no tenderness, no AAA no bruit.  No HSM or HJR Distal pulses intact with no bruits No edema Neuro non-focal Skin warm and dry No muscular weakness   Telemetry   NSR 90s (Personally reviewed)    EKG    No new EKG to revew  Labs    CBC Recent Labs    09/09/23 0810 09/09/23 1111 09/10/23 0447  WBC 8.2  --  7.6  HGB 19.2* 17.3*  17.0 18.3*  HCT 53.7* 51.0  50.0 52.5*  MCV 91.8  --  93.4  PLT 175  --  164   Basic Metabolic Panel Recent Labs    92/89/74 0421 09/09/23 0810 09/09/23 1111 09/10/23 0447  NA 134* 136 137  137 137  K 4.4 3.8 3.6  3.5 3.6  CL 100 98  --  101  CO2 20* 24  --  25  GLUCOSE 149* 160*  --  169*  BUN 19 27*  --  25*  CREATININE 1.51* 1.57*  --  1.34*  CALCIUM 8.6* 9.1  --  8.8*  MG 2.0 2.2  --   --      BNP: BNP (last 3 results) Recent Labs    09/06/23 0856  BNP 2,251.1*     Hemoglobin A1C Recent Labs    09/09/23 0810  HGBA1C 7.2*   Fasting Lipid Panel Recent Labs    09/08/23 1303  CHOL 160  HDL 46  LDLCALC 88  TRIG 132  CHOLHDL 3.5    Other results:   Imaging    MR CARDIAC VELOCITY FLOW MAP Result Date: 09/09/2023 CLINICAL DATA:  Ischemic cardiomyopathy, assess viability EXAM: CARDIAC MRI TECHNIQUE: The patient was scanned on a 1.5 Tesla GE magnet. A dedicated cardiac coil was used. Functional imaging was done using Fiesta sequences. 2,3, and 4 chamber views were done to assess for RWMA's. Modified Simpson's rule using a short axis stack was used to calculate an ejection fraction on a dedicated work [employer]. The patient received 10 cc of Gadavist . After 10 minutes inversion recovery sequences were used to assess for infiltration and scar tissue. FINDINGS: Limited images of the lung fields showed small to moderate bilateral pleural effusions. Normal left ventricular size and wall thickness. There was akinesis of the anterior wall, the anteroseptal wall, and the apex.  The inferoseptal and anterolateral walls were severely hypokinetic. LV EF 21%. 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 Normal right ventricular size with RV EF 26%. Mildly dilated left atrium. Normal right atrium. Trileaflet aortic valve with no stenosis and trivial regurgitation, regurgitant fraction 4%. Mild mitral regurgitation with regurgitant fraction 17%. DELAYED ENHANCEMENT IMAGING: DELAYED ENHANCEMENT IMAGING >50% wall thickness subendocardial late gadolinium enhancement (LGE) at the true apex. <50% wall thickness subendocardial LGE throughout the anteroseptal wall. <50% wall thickness subendocardial LGE throughout the anterior wall. <50% wall thickness subendocardial LGE in the mid to apical anterolateral wall. <50% wall thickness subendocardial LGE in the apical inferoseptal wall. MEASUREMENTS: MEASUREMENTS LVEDV 164 mL LVEDVi 98 mL/m2 LVSV 35 mL LVEF 21% RVEDV  99 mL RVEDVi 59 mL/m2 RVSV 26 mL RVEF 26% Aortic forward volume 29 mL Aortic regurgitant fraction 4% Global T1 1052, ECV 34% IMPRESSION: 1. Normal LV size with wall motion abnormalities as noted above in a pattern suggesting LAD-territory coronary disease. LV EF 21%. 2.  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 3.  Normal RV size with RV EF 25%. 4. Delayed enhancement pattern suggested that the true apex would not be expected to improve in function with revascularization (non-viable). There was <50% wall thickness LGE in the anterior and anteroseptal walls as well as the mid-apical anterolateral wall and the apical inferoseptal wall; this suggests that these wall segments may be viable and may improve function with revascularization. 5. Elevated extracellular volume percentage suggestive of elevated myocardial fibrotic content. Though EF is severely depressed, there does appear to be significant viability in the LAD territory. Joel Pitts Electronically Signed   By: Joel Pitts M.D.   On: 09/09/2023 16:58   MR CARDIAC VELOCITY FLOW MAP Result Date: 09/09/2023 CLINICAL DATA:  Ischemic cardiomyopathy, assess viability EXAM: CARDIAC MRI TECHNIQUE: The patient was scanned on a 1.5 Tesla GE magnet. A dedicated cardiac coil was used. Functional imaging was done using Fiesta sequences. 2,3, and 4 chamber views were done to assess for RWMA's. Modified Simpson's rule using a short axis stack was used to calculate an ejection fraction on a dedicated work [employer], [employer]. The patient received 10 cc of Gadavist . After 10 minutes inversion recovery sequences were used to assess for infiltration and scar tissue. FINDINGS: Limited images of the lung fields showed small to moderate bilateral pleural effusions. Normal left ventricular size and wall thickness. There was akinesis of the anterior wall, the anteroseptal wall, and the apex. The inferoseptal and anterolateral walls were severely hypokinetic. LV  EF 21%. 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 Normal right ventricular size with RV EF 26%. Mildly dilated left atrium. Normal right atrium. Trileaflet aortic valve with no stenosis and trivial regurgitation, regurgitant fraction 4%. Mild mitral regurgitation with regurgitant fraction 17%. DELAYED ENHANCEMENT IMAGING: DELAYED ENHANCEMENT IMAGING >50% wall thickness subendocardial late gadolinium enhancement (LGE) at the true apex. <50% wall thickness subendocardial LGE throughout the anteroseptal wall. <50% wall thickness subendocardial LGE throughout the anterior wall. <50% wall thickness subendocardial LGE in the mid to apical anterolateral wall. <50% wall thickness subendocardial LGE in the apical inferoseptal wall. MEASUREMENTS: MEASUREMENTS LVEDV 164 mL LVEDVi 98 mL/m2 LVSV 35 mL LVEF 21% RVEDV 99 mL RVEDVi 59 mL/m2 RVSV 26 mL RVEF 26% Aortic forward volume 29 mL Aortic regurgitant fraction 4% Global T1 1052, ECV 34% IMPRESSION: 1. Normal LV size with wall motion abnormalities as noted above in a pattern suggesting LAD-territory coronary disease. LV EF 21%. 2.  There  was a layered LV apical mural thrombus. 3.  Normal RV size with RV EF 25%. 4. Delayed enhancement pattern suggested that the true apex would not be expected to improve in function with revascularization (non-viable). There was <50% wall thickness LGE in the anterior and anteroseptal walls as well as the mid-apical anterolateral wall and the apical inferoseptal wall; this suggests that these wall segments may be viable and may improve function with revascularization. 5. Elevated extracellular volume percentage suggestive of elevated myocardial fibrotic content. Though EF is severely depressed, there does appear to be significant viability in the LAD territory. Joel Pitts Electronically Signed   By: Joel Pitts M.D.   On: 09/09/2023 16:57   MR CARDIAC MORPHOLOGY W WO CONTRAST Result Date: 09/09/2023 CLINICAL DATA:  Ischemic cardiomyopathy,  assess viability EXAM: CARDIAC MRI TECHNIQUE: The patient was scanned on a 1.5 Tesla GE magnet. A dedicated cardiac coil was used. Functional imaging was done using Fiesta sequences. 2,3, and 4 chamber views were done to assess for RWMA's. Modified Simpson's rule using a short axis stack was used to calculate an ejection fraction on a dedicated work [employer], [employer]. The patient received 10 cc of Gadavist . After 10 minutes inversion recovery sequences were used to assess for infiltration and scar tissue. FINDINGS: Limited images of the lung fields showed small to moderate bilateral pleural effusions. Normal left ventricular size and wall thickness. There was akinesis of the anterior wall, the anteroseptal wall, and the apex. The inferoseptal and anterolateral walls were severely hypokinetic. LV EF 21%. 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 Normal right ventricular size with RV EF 26%. Mildly dilated left atrium. Normal right atrium. Trileaflet aortic valve with no stenosis and trivial regurgitation, regurgitant fraction 4%. Mild mitral regurgitation with regurgitant fraction 17%. DELAYED ENHANCEMENT IMAGING: DELAYED ENHANCEMENT IMAGING >50% wall thickness subendocardial late gadolinium enhancement (LGE) at the true apex. <50% wall thickness subendocardial LGE throughout the anteroseptal wall. <50% wall thickness subendocardial LGE throughout the anterior wall. <50% wall thickness subendocardial LGE in the mid to apical anterolateral wall. <50% wall thickness subendocardial LGE in the apical inferoseptal wall. MEASUREMENTS: MEASUREMENTS LVEDV 164 mL LVEDVi 98 mL/m2 LVSV 35 mL LVEF 21% RVEDV 99 mL RVEDVi 59 mL/m2 RVSV 26 mL RVEF 26% Aortic forward volume 29 mL Aortic regurgitant fraction 4% Global T1 1052, ECV 34% IMPRESSION: 1. Normal LV size with wall motion abnormalities as noted above in a pattern suggesting LAD-territory coronary disease. LV EF 21%. 2.  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  3.  Normal RV size with RV EF 25%. 4. Delayed enhancement pattern suggested that the true apex would not be expected to improve in function with revascularization (non-viable). There was <50% wall thickness LGE in the anterior and anteroseptal walls as well as the mid-apical anterolateral wall and the apical inferoseptal wall; this suggests that these wall segments may be viable and may improve function with revascularization. 5. Elevated extracellular volume percentage suggestive of elevated myocardial fibrotic content. Though EF is severely depressed, there does appear to be significant viability in the LAD territory. Joel Pitts Electronically Signed   By: Joel Pitts M.D.   On: 09/09/2023 16:57   CARDIAC CATHETERIZATION Result Date: 09/09/2023   Prox RCA lesion is 50% stenosed.   Mid RCA lesion is 50% stenosed.   1st Mrg lesion is 50% stenosed.   Dist LM to Prox LAD lesion is 99% stenosed.   3rd Mrg lesion is 60% stenosed. 1. Normal RA pressure.  2. Mildly elevated PCWP. 3. Mild pulmonary venous hypertension. 4. Low cardiac index, 2.04. 5. The ostial LAD is severely diseased and heavily calcified.  There is 99% stenosis with TIMI 2 flow down the LAD.  There are weak collaterals from RCA to LAD.  I reviewed films with Dr. Wonda.  There is no good landing zone for stenting of ostial LAD, would require involvement of distal left main with possible jeopardization of the LCx.  Patient had cardiac MRI today, will review viability of the LAD territory.  If LAD territory has significant viability, CABG (?off-pump LIMA-LAD) may be our best option.     Medications:     Scheduled Medications:  aspirin  EC  81 mg Oral Daily   digoxin   0.125 mg Oral Daily   empagliflozin   10 mg Oral Daily   feeding supplement (NEPRO CARB STEADY)  237 mL Oral Daily   losartan   25 mg Oral Daily   multivitamin with minerals  1 tablet Oral Daily   sodium chloride  flush  3 mL Intravenous Q12H   spironolactone   25 mg Oral  Daily   tamsulosin   0.4 mg Oral PC supper    Infusions:  sodium chloride      heparin  1,050 Units/hr (09/09/23 1631)    PRN Medications: sodium chloride , acetaminophen , ondansetron  (ZOFRAN ) IV, sodium chloride  flush    Patient Profile   Joel Pitts is a 73 y.o. male with DMII and nephrolithiasis. And ischemic cardiomyopathy   Assessment/Plan  Acute systolic heart failure-  Significant LM dx at cath as well as ostial LAD. Cardiac MRI with viable LAD territory except apex. Given viability EF 21% plan for CABG Monday with Dr Shyrl Continue dig, asa, losartan  and aldactone . CVTS needs to see patient over weekend to answer questions I discussed his MRI results with him at length. Will order pre CABG dopplers Cr stable this am 1.34 with K 3.6  Continue heparin  till surgery with layered mural apical thrombus on TTE and MRI. ? Any benefit to [facility] procedure during CABG to remove apex      Maude Emmer 09/10/2023 7:48 AM

## 2023-09-10 NOTE — Plan of Care (Signed)
  Problem: Health Behavior/Discharge Planning: Goal: Ability to manage health-related needs will improve Outcome: Progressing   Problem: Clinical Measurements: Goal: Will remain free from infection Outcome: Progressing Goal: Respiratory complications will improve Outcome: Progressing Goal: Cardiovascular complication will be avoided Outcome: Progressing   Problem: Activity: Goal: Risk for activity intolerance will decrease Outcome: Progressing   Problem: Nutrition: Goal: Adequate nutrition will be maintained Outcome: Progressing   Problem: Coping: Goal: Level of anxiety will decrease Outcome: Progressing   Problem: Activity: Goal: Ability to return to baseline activity level will improve Outcome: Progressing   Problem: Cardiovascular: Goal: Ability to achieve and maintain adequate cardiovascular perfusion will improve Outcome: Progressing Goal: Vascular access site(s) Level 0-1 will be maintained Outcome: Progressing

## 2023-09-11 ENCOUNTER — Inpatient Hospital Stay (HOSPITAL_COMMUNITY)

## 2023-09-11 ENCOUNTER — Encounter (HOSPITAL_COMMUNITY): Payer: Self-pay | Admitting: Cardiology

## 2023-09-11 DIAGNOSIS — Z01810 Encounter for preprocedural cardiovascular examination: Secondary | ICD-10-CM

## 2023-09-11 DIAGNOSIS — I255 Ischemic cardiomyopathy: Secondary | ICD-10-CM | POA: Diagnosis not present

## 2023-09-11 DIAGNOSIS — I5023 Acute on chronic systolic (congestive) heart failure: Secondary | ICD-10-CM | POA: Diagnosis not present

## 2023-09-11 LAB — URINALYSIS, ROUTINE W REFLEX MICROSCOPIC
Bacteria, UA: NONE SEEN
Bilirubin Urine: NEGATIVE
Glucose, UA: >=500 mg/dL — AB
Hgb urine dipstick: NEGATIVE
Ketones, ur: 5 mg/dL — AB
Leukocytes,Ua: NEGATIVE
Nitrite: NEGATIVE
Protein, ur: NEGATIVE mg/dL
Specific Gravity, Urine: 1.027 (ref 1.005–1.030)
pH: 7 (ref 5.0–8.0)

## 2023-09-11 LAB — TYPE AND SCREEN
ABO/RH(D): B POS
Antibody Screen: NEGATIVE

## 2023-09-11 LAB — PROTIME-INR
INR: 1.1 (ref 0.8–1.2)
Prothrombin Time: 14.4 s (ref 11.4–15.2)

## 2023-09-11 LAB — BASIC METABOLIC PANEL WITH GFR
Anion gap: 10 (ref 5–15)
BUN: 20 mg/dL (ref 8–23)
CO2: 20 mmol/L — ABNORMAL LOW (ref 22–32)
Calcium: 8.6 mg/dL — ABNORMAL LOW (ref 8.9–10.3)
Chloride: 103 mmol/L (ref 98–111)
Creatinine, Ser: 1.26 mg/dL — ABNORMAL HIGH (ref 0.61–1.24)
GFR, Estimated: 60 mL/min — ABNORMAL LOW (ref >60)
Glucose, Bld: 158 mg/dL — ABNORMAL HIGH (ref 70–99)
Potassium: 4 mmol/L (ref 3.5–5.1)
Sodium: 133 mmol/L — ABNORMAL LOW (ref 135–145)

## 2023-09-11 LAB — APTT: aPTT: 117 s — ABNORMAL HIGH (ref 24–36)

## 2023-09-11 LAB — BLOOD GAS, ARTERIAL
Acid-Base Excess: 0.9 mmol/L (ref 0.0–2.0)
Bicarbonate: 22 mmol/L (ref 20.0–28.0)
Drawn by: 38235
O2 Saturation: 99.2 %
Patient temperature: 37
pCO2 arterial: 27 mmHg — ABNORMAL LOW (ref 32–48)
pH, Arterial: 7.52 — ABNORMAL HIGH (ref 7.35–7.45)
pO2, Arterial: 99 mmHg (ref 83–108)

## 2023-09-11 LAB — CBC
HCT: 49 % (ref 39.0–52.0)
Hemoglobin: 17.3 g/dL — ABNORMAL HIGH (ref 13.0–17.0)
MCH: 32.5 pg (ref 26.0–34.0)
MCHC: 35.3 g/dL (ref 30.0–36.0)
MCV: 92.1 fL (ref 80.0–100.0)
Platelets: 156 K/uL (ref 150–400)
RBC: 5.32 MIL/uL (ref 4.22–5.81)
RDW: 12.5 % (ref 11.5–15.5)
WBC: 6.9 K/uL (ref 4.0–10.5)
nRBC: 0 % (ref 0.0–0.2)

## 2023-09-11 LAB — ABO/RH: ABO/RH(D): B POS

## 2023-09-11 LAB — VAS US DOPPLER PRE CABG

## 2023-09-11 LAB — SURGICAL PCR SCREEN
MRSA, PCR: NEGATIVE
Staphylococcus aureus: NEGATIVE

## 2023-09-11 LAB — HEPARIN LEVEL (UNFRACTIONATED): Heparin Unfractionated: 0.46 [IU]/mL (ref 0.30–0.70)

## 2023-09-11 LAB — SARS CORONAVIRUS 2 BY RT PCR: SARS Coronavirus 2 by RT PCR: NEGATIVE

## 2023-09-11 NOTE — Progress Notes (Signed)
 PHARMACY - ANTICOAGULATION CONSULT NOTE  Pharmacy Consult for heparin  Indication: Echo showing apical clot  Allergies  Allergen Reactions   Statins Diarrhea   Patient Measurements: Height: 5' 6 (167.6 cm) Weight: 60.9 kg (134 lb 4.8 oz) IBW/kg (Calculated) : 63.8 HEPARIN  DW (KG): 63.5  Vital Signs: Temp: 97.8 F (36.6 C) (07/13 0733) Temp Source: Oral (07/13 0733) BP: 108/76 (07/13 0733) Pulse Rate: 93 (07/13 0733)  Labs: Recent Labs    09/09/23 0810 09/09/23 1111 09/10/23 0447 09/11/23 0514  HGB 19.2* 17.3*  17.0 18.3* 17.3*  HCT 53.7* 51.0  50.0 52.5* 49.0  PLT 175  --  164 156  APTT  --   --   --  117*  LABPROT  --   --   --  14.4  INR  --   --   --  1.1  HEPARINUNFRC 0.48  --  0.67 0.46  CREATININE 1.57*  --  1.34* 1.26*   Estimated Creatinine Clearance: 44.3 mL/min (A) (by C-G formula based on SCr of 1.26 mg/dL (H)).  Assessment: 73 yo male with HF and apical clot on ECHO. Pharmacy consulted to dose IV heparin .  -CBC mostly stable; slight downtrend in Plt 219> 175 > 164 > 156 -Heparin  was therapeutic this AM 7/13 at 0.46, on 1050 units/hr.  Goal of Therapy:  Heparin  level 0.3-0.7 units/ml Monitor platelets by anticoagulation protocol: Yes   Plan:  Continue heparin  infusion at 1050 units/hr Daily heparin  level, CBC Monitor s/sx bleeding  Thank you for allowing pharmacy to participate in this patient's care.  Maurilio Patten, PharmD PGY1 Pharmacy Resident [facility]  09/11/2023 9:22 AM

## 2023-09-11 NOTE — Progress Notes (Signed)
 Cardiologist:  Rolan  Subjective:    Seen by Dr Shyrl yesterday seems much more at ease about CABG  Objective:   Weight Range: 60.9 kg Body mass index is 21.68 kg/m.   Vital Signs:   Temp:  [97.4 F (36.3 C)-98 F (36.7 C)] 97.8 F (36.6 C) (07/13 0733) Pulse Rate:  [93-102] 93 (07/13 0733) Resp:  [16-20] 16 (07/13 0733) BP: (108-115)/(69-78) 108/76 (07/13 0733) SpO2:  [96 %-99 %] 97 % (07/13 0733) Weight:  [60.9 kg] 60.9 kg (07/13 0516) Last BM Date : 09/08/23  Weight change: Filed Weights   09/09/23 0614 09/10/23 0449 09/11/23 0516  Weight: 59.9 kg 60.4 kg 60.9 kg    Intake/Output:   Intake/Output Summary (Last 24 hours) at 09/11/2023 0853 Last data filed at 09/11/2023 0517 Gross per 24 hour  Intake 66.14 ml  Output 900 ml  Net -833.86 ml      Physical Exam    Affect appropriate Healthy:  appears stated age HEENT: normal Neck supple with no adenopathy JVP normal no bruits no thyromegaly Lungs clear with no wheezing and good diaphragmatic motion Heart:  S1/S2 no murmur, no rub, gallop or click PMI normal Abdomen: benighn, BS positve, no tenderness, no AAA no bruit.  No HSM or HJR Distal pulses intact with no bruits No edema Neuro non-focal Skin warm and dry No muscular weakness   Telemetry   NSR 90s (Personally reviewed)    EKG    No new EKG to revew  Labs    CBC Recent Labs    09/10/23 0447 09/11/23 0514  WBC 7.6 6.9  HGB 18.3* 17.3*  HCT 52.5* 49.0  MCV 93.4 92.1  PLT 164 156   Basic Metabolic Panel Recent Labs    92/88/74 0810 09/09/23 1111 09/10/23 0447 09/11/23 0514  NA 136   < > 137 133*  K 3.8   < > 3.6 4.0  CL 98  --  101 103  CO2 24  --  25 20*  GLUCOSE 160*  --  169* 158*  BUN 27*  --  25* 20  CREATININE 1.57*  --  1.34* 1.26*  CALCIUM 9.1  --  8.8* 8.6*  MG 2.2  --   --   --    < > = values in this interval not displayed.     BNP: BNP (last 3 results) Recent Labs    09/06/23 0856  BNP  2,251.1*    Hemoglobin A1C Recent Labs    09/09/23 0810  HGBA1C 7.2*   Fasting Lipid Panel Recent Labs    09/08/23 1303  CHOL 160  HDL 46  LDLCALC 88  TRIG 132  CHOLHDL 3.5    Other results:   Imaging    No results found.    Medications:     Scheduled Medications:  aspirin  EC  81 mg Oral Daily   digoxin   0.125 mg Oral Daily   empagliflozin   10 mg Oral Daily   feeding supplement (NEPRO CARB STEADY)  237 mL Oral Daily   losartan   25 mg Oral Daily   multivitamin with minerals  1 tablet Oral Daily   sodium chloride  flush  3 mL Intravenous Q12H   spironolactone   25 mg Oral Daily   tamsulosin   0.4 mg Oral PC supper    Infusions:  heparin  1,050 Units/hr (09/10/23 1550)    PRN Medications: acetaminophen , ondansetron  (ZOFRAN ) IV, sodium chloride  flush    Patient Profile   Joel Pitts is  a 73 y.o. male with DMII and nephrolithiasis. And ischemic cardiomyopathy   Assessment/Plan  Acute systolic heart failure-  Significant LM dx at cath as well as ostial LAD. Cardiac MRI with viable LAD territory except apex. Given viability EF 21% plan for CABG Monday with Dr Shyrl Continue dig, asa, losartan  and aldactone .  I discussed his MRI results with him at length. Will order pre CABG dopplers Cr stable this am 1.26 with K 4 Continue heparin  till surgery with layered mural apical thrombus on TTE and MRI. ? Any benefit to [facility] procedure during CABG to remove apex  I also discussed need for f/u echo and AICD evaluation post CABG depending on degree of LV recovery      Maude Emmer 09/11/2023 8:53 AM

## 2023-09-11 NOTE — Progress Notes (Addendum)
  Progress Note   Patient: Joel Pitts FMW:[id number] DOB: [birth year] DOA: 09/06/2023     5 DOS: the patient was seen and examined on 09/11/2023   Brief hospital course: Patient is a 73 year old male with past medical history significant for type 2 diabetes mellitus and nephrolithiasis.  He was admitted with heart failure.  Echocardiogram revealed EF of less than 20%.  Heart failure team is directing care.  Patient has undergone cardiac MRI and cardiac catheterization today.  Further management as per heart failure team.  09/10/2023: Patient seen.  No new complaints.  Cardiology input is appreciated.  Possible CABG on Monday, 09/12/2023.  09/11/2023: Patient seen.  No new complaints.  Likely CABG tomorrow.  Cardiothoracic input is appreciated.  Assessment and Plan: Acute on chronic systolic CHF (congestive heart failure) (HCC): -Echocardiogram with decreased LV systolic function with EF < 20%, global hypokinesis, RV systolic function with mild reduction, LA with moderate dilatation, no significant valvular disease.  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  - Heart failure team is directing care.  Patient underwent cardiac MRI and cardiac catheterization. -Likely CABG as per cardiology team. -Patient is on digoxin , Jardiance , losartan  and Aldactone . -Strict I's and O's. - Symptoms are stable.  BPH (benign prostatic hyperplasia) Continue with flomax , no signs of acute urinary retention.   Essential hypertension Continue blood pressure control with losartan , possible transition to entresto   AKI (acute kidney injury) (HCC) versus CKD 3A: - Baseline serum creatinine not visualized. - Serum creatinine today is 1.26.   - Continue to monitor closely with diuresis.  Hyponatremia: - Volume related. - Resolved.  Sodium of 133.    Type 2 diabetes mellitus (HCC) Continue glucose cover and monitoring with insulin  sliding scale Hyperglycemia   Continue SGLT 2 inh, will resume metformin at the  time of discharge.   Severe malnutrition:     Subjective: Patient with improvement in dyspnea and orthopnea, no chest pain, no dizziness or lightheadedness.   Physical Exam: Vitals:   09/11/23 0025 09/11/23 0516 09/11/23 0733 09/11/23 1221  BP: 110/75 114/77 108/76 108/65  Pulse: 98 (!) 102 93 91  Resp: 16 20 16 15   Temp: 98 F (36.7 C) 97.9 F (36.6 C) 97.8 F (36.6 C) 97.9 F (36.6 C)  TempSrc: Oral Oral Oral Oral  SpO2: 96% 98% 97% 97%  Weight:  60.9 kg    Height:       General Condition: Not in any distress.  Awake and alert. HEENT: Mild pallor. Neck: Supple. Lungs: Clear to auscultation. CVS: S1-S2. Abdomen: Soft and nontender. Neuro: Awake and alert. Extremities no edema.   Data Reviewed:    Family Communication: no family at the bedside   Disposition: Status is: Inpatient Remains inpatient appropriate because: Heart failure workup   Planned Discharge Destination: Home    Author: Leatrice LILLETTE Chapel, MD 09/11/2023 2:05 PM  For on call review [email or web address].

## 2023-09-11 NOTE — Progress Notes (Signed)
 VASCULAR LAB    Pre CABG Dopplers have been performed.  See CV proc for preliminary results.   Caniya Tagle, RVT 09/11/2023, 2:24 PM

## 2023-09-12 ENCOUNTER — Inpatient Hospital Stay (HOSPITAL_COMMUNITY)

## 2023-09-12 ENCOUNTER — Encounter (HOSPITAL_COMMUNITY): Payer: Self-pay | Admitting: Hospitalist

## 2023-09-12 ENCOUNTER — Other Ambulatory Visit: Payer: Self-pay

## 2023-09-12 ENCOUNTER — Inpatient Hospital Stay (HOSPITAL_COMMUNITY)
Admission: EM | Disposition: A | Discharge status: Home Health | Payer: Self-pay | Source: Ambulatory Visit | Attending: Thoracic Surgery (Cardiothoracic Vascular Surgery)

## 2023-09-12 DIAGNOSIS — I255 Ischemic cardiomyopathy: Secondary | ICD-10-CM | POA: Diagnosis not present

## 2023-09-12 DIAGNOSIS — I5021 Acute systolic (congestive) heart failure: Secondary | ICD-10-CM

## 2023-09-12 DIAGNOSIS — I509 Heart failure, unspecified: Secondary | ICD-10-CM

## 2023-09-12 DIAGNOSIS — E785 Hyperlipidemia, unspecified: Secondary | ICD-10-CM

## 2023-09-12 DIAGNOSIS — E1165 Type 2 diabetes mellitus with hyperglycemia: Secondary | ICD-10-CM | POA: Diagnosis not present

## 2023-09-12 DIAGNOSIS — I5023 Acute on chronic systolic (congestive) heart failure: Secondary | ICD-10-CM | POA: Diagnosis not present

## 2023-09-12 DIAGNOSIS — J90 Pleural effusion, not elsewhere classified: Secondary | ICD-10-CM

## 2023-09-12 DIAGNOSIS — I10 Essential (primary) hypertension: Secondary | ICD-10-CM | POA: Diagnosis not present

## 2023-09-12 DIAGNOSIS — I2510 Atherosclerotic heart disease of native coronary artery without angina pectoris: Secondary | ICD-10-CM | POA: Diagnosis present

## 2023-09-12 DIAGNOSIS — E119 Type 2 diabetes mellitus without complications: Secondary | ICD-10-CM

## 2023-09-12 DIAGNOSIS — I110 Hypertensive heart disease with heart failure: Secondary | ICD-10-CM

## 2023-09-12 HISTORY — PX: CORONARY ARTERY BYPASS GRAFT: SHX141

## 2023-09-12 LAB — POCT I-STAT 7, (LYTES, BLD GAS, ICA,H+H)
Acid-base deficit: 6 mmol/L — ABNORMAL HIGH (ref 0.0–2.0)
Acid-base deficit: 6 mmol/L — ABNORMAL HIGH (ref 0.0–2.0)
Acid-base deficit: 5 mmol/L — ABNORMAL HIGH (ref 0.0–2.0)
Acid-base deficit: 5 mmol/L — ABNORMAL HIGH (ref 0.0–2.0)
Acid-base deficit: 6 mmol/L — ABNORMAL HIGH (ref 0.0–2.0)
Bicarbonate: 19.1 mmol/L — ABNORMAL LOW (ref 20.0–28.0)
Bicarbonate: 19.7 mmol/L — ABNORMAL LOW (ref 20.0–28.0)
Bicarbonate: 21.4 mmol/L (ref 20.0–28.0)
Bicarbonate: 20.1 mmol/L (ref 20.0–28.0)
Bicarbonate: 19.7 mmol/L — ABNORMAL LOW (ref 20.0–28.0)
Calcium, Ion: 1.14 mmol/L — ABNORMAL LOW (ref 1.15–1.40)
Calcium, Ion: 1.14 mmol/L — ABNORMAL LOW (ref 1.15–1.40)
Calcium, Ion: 1.13 mmol/L — ABNORMAL LOW (ref 1.15–1.40)
Calcium, Ion: 1.16 mmol/L (ref 1.15–1.40)
Calcium, Ion: 1.14 mmol/L — ABNORMAL LOW (ref 1.15–1.40)
HCT: 45 % (ref 39.0–52.0)
HCT: 42 % (ref 39.0–52.0)
HCT: 40 % (ref 39.0–52.0)
HCT: 42 % (ref 39.0–52.0)
HCT: 42 % (ref 39.0–52.0)
Hemoglobin: 15.3 g/dL (ref 13.0–17.0)
Hemoglobin: 14.3 g/dL (ref 13.0–17.0)
Hemoglobin: 13.6 g/dL (ref 13.0–17.0)
Hemoglobin: 14.3 g/dL (ref 13.0–17.0)
Hemoglobin: 14.3 g/dL (ref 13.0–17.0)
O2 Saturation: 99 %
O2 Saturation: 97 %
O2 Saturation: 99 %
O2 Saturation: 99 %
O2 Saturation: 93 %
Patient temperature: 34.1
Patient temperature: 34.7
Patient temperature: 36.7
Patient temperature: 37.2
Potassium: 3.7 mmol/L (ref 3.5–5.1)
Potassium: 3.7 mmol/L (ref 3.5–5.1)
Potassium: 4 mmol/L (ref 3.5–5.1)
Potassium: 4.5 mmol/L (ref 3.5–5.1)
Potassium: 4.4 mmol/L (ref 3.5–5.1)
Sodium: 136 mmol/L (ref 135–145)
Sodium: 137 mmol/L (ref 135–145)
Sodium: 139 mmol/L (ref 135–145)
Sodium: 137 mmol/L (ref 135–145)
Sodium: 137 mmol/L (ref 135–145)
TCO2: 20 mmol/L — ABNORMAL LOW (ref 22–32)
TCO2: 21 mmol/L — ABNORMAL LOW (ref 22–32)
TCO2: 23 mmol/L (ref 22–32)
TCO2: 21 mmol/L — ABNORMAL LOW (ref 22–32)
TCO2: 21 mmol/L — ABNORMAL LOW (ref 22–32)
pCO2 arterial: 37.2 mmHg (ref 32–48)
pCO2 arterial: 34.3 mmHg (ref 32–48)
pCO2 arterial: 37.8 mmHg (ref 32–48)
pCO2 arterial: 37.9 mmHg (ref 32–48)
pCO2 arterial: 40.1 mmHg (ref 32–48)
pH, Arterial: 7.318 — ABNORMAL LOW (ref 7.35–7.45)
pH, Arterial: 7.352 (ref 7.35–7.45)
pH, Arterial: 7.35 (ref 7.35–7.45)
pH, Arterial: 7.33 — ABNORMAL LOW (ref 7.35–7.45)
pH, Arterial: 7.3 — ABNORMAL LOW (ref 7.35–7.45)
pO2, Arterial: 176 mmHg — ABNORMAL HIGH (ref 83–108)
pO2, Arterial: 84 mmHg (ref 83–108)
pO2, Arterial: 123 mmHg — ABNORMAL HIGH (ref 83–108)
pO2, Arterial: 125 mmHg — ABNORMAL HIGH (ref 83–108)
pO2, Arterial: 75 mmHg — ABNORMAL LOW (ref 83–108)

## 2023-09-12 LAB — POCT I-STAT, CHEM 8
BUN: 19 mg/dL (ref 8–23)
BUN: 17 mg/dL (ref 8–23)
BUN: 19 mg/dL (ref 8–23)
Calcium, Ion: 1.19 mmol/L (ref 1.15–1.40)
Calcium, Ion: 1.1 mmol/L — ABNORMAL LOW (ref 1.15–1.40)
Calcium, Ion: 1.17 mmol/L (ref 1.15–1.40)
Chloride: 103 mmol/L (ref 98–111)
Chloride: 105 mmol/L (ref 98–111)
Chloride: 103 mmol/L (ref 98–111)
Creatinine, Ser: 1.1 mg/dL (ref 0.61–1.24)
Creatinine, Ser: 1 mg/dL (ref 0.61–1.24)
Creatinine, Ser: 1.1 mg/dL (ref 0.61–1.24)
Glucose, Bld: 169 mg/dL — ABNORMAL HIGH (ref 70–99)
Glucose, Bld: 194 mg/dL — ABNORMAL HIGH (ref 70–99)
Glucose, Bld: 211 mg/dL — ABNORMAL HIGH (ref 70–99)
HCT: 48 % (ref 39.0–52.0)
HCT: 43 % (ref 39.0–52.0)
HCT: 48 % (ref 39.0–52.0)
Hemoglobin: 16.3 g/dL (ref 13.0–17.0)
Hemoglobin: 14.6 g/dL (ref 13.0–17.0)
Hemoglobin: 16.3 g/dL (ref 13.0–17.0)
Potassium: 4.2 mmol/L (ref 3.5–5.1)
Potassium: 3.8 mmol/L (ref 3.5–5.1)
Potassium: 3.8 mmol/L (ref 3.5–5.1)
Sodium: 136 mmol/L (ref 135–145)
Sodium: 137 mmol/L (ref 135–145)
Sodium: 136 mmol/L (ref 135–145)
TCO2: 20 mmol/L — ABNORMAL LOW (ref 22–32)
TCO2: 19 mmol/L — ABNORMAL LOW (ref 22–32)
TCO2: 20 mmol/L — ABNORMAL LOW (ref 22–32)

## 2023-09-12 LAB — BASIC METABOLIC PANEL WITH GFR
Anion gap: 11 (ref 5–15)
Anion gap: 13 (ref 5–15)
BUN: 17 mg/dL (ref 8–23)
BUN: 16 mg/dL (ref 8–23)
CO2: 21 mmol/L — ABNORMAL LOW (ref 22–32)
CO2: 20 mmol/L — ABNORMAL LOW (ref 22–32)
Calcium: 8.8 mg/dL — ABNORMAL LOW (ref 8.9–10.3)
Calcium: 8 mg/dL — ABNORMAL LOW (ref 8.9–10.3)
Chloride: 100 mmol/L (ref 98–111)
Chloride: 103 mmol/L (ref 98–111)
Creatinine, Ser: 1.3 mg/dL — ABNORMAL HIGH (ref 0.61–1.24)
Creatinine, Ser: 1.25 mg/dL — ABNORMAL HIGH (ref 0.61–1.24)
GFR, Estimated: 58 mL/min — ABNORMAL LOW (ref >60)
GFR, Estimated: >60 mL/min (ref >60)
Glucose, Bld: 154 mg/dL — ABNORMAL HIGH (ref 70–99)
Glucose, Bld: 103 mg/dL — ABNORMAL HIGH (ref 70–99)
Potassium: 4.7 mmol/L (ref 3.5–5.1)
Potassium: 4.3 mmol/L (ref 3.5–5.1)
Sodium: 132 mmol/L — ABNORMAL LOW (ref 135–145)
Sodium: 136 mmol/L (ref 135–145)

## 2023-09-12 LAB — CBC WITH DIFFERENTIAL/PLATELET
Abs Immature Granulocytes: 0.04 K/uL (ref 0.00–0.07)
Basophils Absolute: 0.1 K/uL (ref 0.0–0.1)
Basophils Relative: 0 %
Eosinophils Absolute: 0.2 K/uL (ref 0.0–0.5)
Eosinophils Relative: 1 %
HCT: 42.7 % (ref 39.0–52.0)
Hemoglobin: 15 g/dL (ref 13.0–17.0)
Immature Granulocytes: 0 %
Lymphocytes Relative: 11 %
Lymphs Abs: 1.7 K/uL (ref 0.7–4.0)
MCH: 32.8 pg (ref 26.0–34.0)
MCHC: 35.1 g/dL (ref 30.0–36.0)
MCV: 93.4 fL (ref 80.0–100.0)
Monocytes Absolute: 1.9 K/uL — ABNORMAL HIGH (ref 0.1–1.0)
Monocytes Relative: 13 %
Neutro Abs: 11.3 K/uL — ABNORMAL HIGH (ref 1.7–7.7)
Neutrophils Relative %: 75 %
Platelets: 192 K/uL (ref 150–400)
RBC: 4.57 MIL/uL (ref 4.22–5.81)
RDW: 12.3 % (ref 11.5–15.5)
WBC: 15.3 K/uL — ABNORMAL HIGH (ref 4.0–10.5)
nRBC: 0 % (ref 0.0–0.2)

## 2023-09-12 LAB — COOXEMETRY PANEL
Carboxyhemoglobin: 2.3 % — ABNORMAL HIGH (ref 0.5–1.5)
Methemoglobin: 0.7 % (ref 0.0–1.5)
O2 Saturation: 68.9 %
Total hemoglobin: 14.4 g/dL (ref 12.0–16.0)

## 2023-09-12 LAB — CBC
HCT: 51 % (ref 39.0–52.0)
HCT: 43.6 % (ref 39.0–52.0)
Hemoglobin: 17.7 g/dL — ABNORMAL HIGH (ref 13.0–17.0)
Hemoglobin: 15 g/dL (ref 13.0–17.0)
MCH: 32.2 pg (ref 26.0–34.0)
MCH: 32.3 pg (ref 26.0–34.0)
MCHC: 34.7 g/dL (ref 30.0–36.0)
MCHC: 34.4 g/dL (ref 30.0–36.0)
MCV: 92.9 fL (ref 80.0–100.0)
MCV: 94 fL (ref 80.0–100.0)
Platelets: 167 K/uL (ref 150–400)
Platelets: 127 K/uL — ABNORMAL LOW (ref 150–400)
RBC: 5.49 MIL/uL (ref 4.22–5.81)
RBC: 4.64 MIL/uL (ref 4.22–5.81)
RDW: 12.5 % (ref 11.5–15.5)
RDW: 12.4 % (ref 11.5–15.5)
WBC: 6.5 K/uL (ref 4.0–10.5)
WBC: 7.3 K/uL (ref 4.0–10.5)
nRBC: 0 % (ref 0.0–0.2)
nRBC: 0 % (ref 0.0–0.2)

## 2023-09-12 LAB — GLUCOSE, CAPILLARY
Glucose-Capillary: 164 mg/dL — ABNORMAL HIGH (ref 70–99)
Glucose-Capillary: 132 mg/dL — ABNORMAL HIGH (ref 70–99)
Glucose-Capillary: 112 mg/dL — ABNORMAL HIGH (ref 70–99)
Glucose-Capillary: 97 mg/dL (ref 70–99)
Glucose-Capillary: 102 mg/dL — ABNORMAL HIGH (ref 70–99)
Glucose-Capillary: 99 mg/dL (ref 70–99)
Glucose-Capillary: 98 mg/dL (ref 70–99)
Glucose-Capillary: 136 mg/dL — ABNORMAL HIGH (ref 70–99)
Glucose-Capillary: 108 mg/dL — ABNORMAL HIGH (ref 70–99)
Glucose-Capillary: 178 mg/dL — ABNORMAL HIGH (ref 70–99)
Glucose-Capillary: 215 mg/dL — ABNORMAL HIGH (ref 70–99)
Glucose-Capillary: 199 mg/dL — ABNORMAL HIGH (ref 70–99)
Glucose-Capillary: 173 mg/dL — ABNORMAL HIGH (ref 70–99)

## 2023-09-12 LAB — HEPARIN LEVEL (UNFRACTIONATED): Heparin Unfractionated: 0.36 [IU]/mL (ref 0.30–0.70)

## 2023-09-12 LAB — PROTIME-INR
INR: 1.2 (ref 0.8–1.2)
Prothrombin Time: 16 s — ABNORMAL HIGH (ref 11.4–15.2)

## 2023-09-12 LAB — APTT: aPTT: 33 s (ref 24–36)

## 2023-09-12 LAB — LIPOPROTEIN A (LPA): Lipoprotein (a): 24.1 nmol/L (ref <75.0)

## 2023-09-12 SURGERY — OFF PUMP CORONARY ARTERY BYPASS GRAFTING (CABG)
Anesthesia: General | Site: Chest

## 2023-09-12 MED ORDER — VASOPRESSIN 20 UNIT/ML IV SOLN
INTRAVENOUS | Status: DC | PRN
Start: 2023-09-12 — End: 2023-09-12
  Administered 2023-09-12: .5 [IU] via INTRAVENOUS
  Administered 2023-09-12: 1 [IU] via INTRAVENOUS

## 2023-09-12 MED ORDER — ALBUMIN HUMAN 5 % IV SOLN: INTRAVENOUS | Status: DC | PRN

## 2023-09-12 MED ORDER — ASPIRIN 325 MG PO TBEC
325.0000 mg | DELAYED_RELEASE_TABLET | Freq: Every day | ORAL | Status: DC
  Administered 2023-09-13 – 2023-09-15 (×3): 325 mg via ORAL
  Filled 2023-09-12 (×3): qty 1

## 2023-09-12 MED ORDER — SODIUM BICARBONATE 8.4 % IV SOLN
50.0000 meq | Freq: Once | INTRAVENOUS | Status: AC
  Administered 2023-09-12: 50 meq via INTRAVENOUS

## 2023-09-12 MED ORDER — FENTANYL CITRATE (PF) 250 MCG/5ML IJ SOLN
INTRAMUSCULAR | Status: AC
  Filled 2023-09-12: qty 5

## 2023-09-12 MED ORDER — CHLORHEXIDINE GLUCONATE 0.12 % MT SOLN
15.0000 mL | OROMUCOSAL | Status: AC
  Filled 2023-09-12: qty 15

## 2023-09-12 MED ORDER — FENTANYL CITRATE (PF) 250 MCG/5ML IJ SOLN
INTRAMUSCULAR | Status: DC | PRN
  Administered 2023-09-12: 100 ug via INTRAVENOUS
  Administered 2023-09-12: 50 ug via INTRAVENOUS
  Administered 2023-09-12: 100 ug via INTRAVENOUS
  Administered 2023-09-12: 150 ug via INTRAVENOUS
  Administered 2023-09-12: 100 ug via INTRAVENOUS
  Administered 2023-09-12 (×2): 50 ug via INTRAVENOUS

## 2023-09-12 MED ORDER — NOREPINEPHRINE BITARTRATE 1 MG/ML IV SOLN
INTRAVENOUS | Status: DC | PRN
  Administered 2023-09-12: 1 mL via INTRAVENOUS
  Administered 2023-09-12 (×2): .5 mL via INTRAVENOUS
  Administered 2023-09-12: 2 mL via INTRAVENOUS

## 2023-09-12 MED ORDER — TRANEXAMIC ACID (OHS) BOLUS VIA INFUSION
15.0000 mg/kg | INTRAVENOUS | Status: AC
  Administered 2023-09-12: 915 mg via INTRAVENOUS

## 2023-09-12 MED ORDER — TRAMADOL HCL 50 MG PO TABS
50.0000 mg | ORAL_TABLET | ORAL | Status: DC | PRN
  Administered 2023-09-12 – 2023-09-18 (×6): 100 mg via ORAL
  Administered 2023-09-22: 50 mg via ORAL
  Filled 2023-09-12 (×2): qty 2
  Filled 2023-09-12: qty 1
  Filled 2023-09-12 (×4): qty 2

## 2023-09-12 MED ORDER — MIDAZOLAM HCL 2 MG/2ML IJ SOLN: 2.0000 mg | INTRAMUSCULAR | Status: DC | PRN

## 2023-09-12 MED ORDER — SODIUM CHLORIDE 0.9% FLUSH
3.0000 mL | Freq: Two times a day (BID) | INTRAVENOUS | Status: DC
  Administered 2023-09-12 – 2023-09-14 (×3): 10 mL via INTRAVENOUS
  Administered 2023-09-17: 3 mL via INTRAVENOUS
  Administered 2023-09-17 – 2023-09-18 (×2): 10 mL via INTRAVENOUS
  Administered 2023-09-18: 3 mL via INTRAVENOUS

## 2023-09-12 MED ORDER — ONDANSETRON HCL 4 MG/2ML IJ SOLN
INTRAMUSCULAR | Status: DC | PRN
  Administered 2023-09-12: 4 mg via INTRAVENOUS

## 2023-09-12 MED ORDER — VANCOMYCIN HCL 1250 MG/250ML IV SOLN
1250.0000 mg | INTRAVENOUS | Status: AC
  Administered 2023-09-12: 1250 mg via INTRAVENOUS
  Filled 2023-09-12: qty 250

## 2023-09-12 MED ORDER — SODIUM CHLORIDE 0.9% FLUSH
3.0000 mL | Freq: Two times a day (BID) | INTRAVENOUS | Status: DC
  Administered 2023-09-12 – 2023-09-16 (×5): 10 mL via INTRAVENOUS
  Administered 2023-09-17: 3 mL via INTRAVENOUS
  Administered 2023-09-17: 10 mL via INTRAVENOUS
  Administered 2023-09-18: 3 mL via INTRAVENOUS
  Administered 2023-09-18: 10 mL via INTRAVENOUS

## 2023-09-12 MED ORDER — CHLORHEXIDINE GLUCONATE 0.12 % MT SOLN: 15.0000 mL | Freq: Once | OROMUCOSAL | Status: DC

## 2023-09-12 MED ORDER — PANTOPRAZOLE SODIUM 40 MG PO TBEC
40.0000 mg | DELAYED_RELEASE_TABLET | Freq: Every day | ORAL | Status: DC
  Administered 2023-09-14 – 2023-09-23 (×10): 40 mg via ORAL
  Filled 2023-09-12 (×10): qty 1

## 2023-09-12 MED ORDER — BISACODYL 10 MG RE SUPP: 10.0000 mg | Freq: Every day | RECTAL | Status: DC

## 2023-09-12 MED ORDER — LACTATED RINGERS IV SOLN: INTRAVENOUS | Status: DC | PRN

## 2023-09-12 MED ORDER — EPINEPHRINE HCL 5 MG/250ML IV SOLN IN NS
0.0000 ug/min | INTRAVENOUS | Status: DC
  Administered 2023-09-13: 8 ug/min via INTRAVENOUS
  Administered 2023-09-13: 4 ug/min via INTRAVENOUS
  Administered 2023-09-14: 3 ug/min via INTRAVENOUS
  Filled 2023-09-12 (×2): qty 250

## 2023-09-12 MED ORDER — METOPROLOL TARTRATE 12.5 MG HALF TABLET: 12.5000 mg | ORAL_TABLET | Freq: Two times a day (BID) | ORAL | Status: DC

## 2023-09-12 MED ORDER — ONDANSETRON HCL 4 MG/2ML IJ SOLN
4.0000 mg | Freq: Four times a day (QID) | INTRAMUSCULAR | Status: DC | PRN
  Administered 2023-09-13 – 2023-09-14 (×4): 4 mg via INTRAVENOUS
  Filled 2023-09-12 (×4): qty 2

## 2023-09-12 MED ORDER — HEPARIN SODIUM (PORCINE) 1000 UNIT/ML IJ SOLN
INTRAMUSCULAR | Status: DC | PRN
  Administered 2023-09-12: 9000 [IU] via INTRAVENOUS

## 2023-09-12 MED ORDER — ACETAMINOPHEN 500 MG PO TABS: 1000.0000 mg | ORAL_TABLET | Freq: Four times a day (QID) | ORAL | Status: DC

## 2023-09-12 MED ORDER — ROCURONIUM BROMIDE 10 MG/ML (PF) SYRINGE
PREFILLED_SYRINGE | INTRAVENOUS | Status: AC
Start: 2023-09-12 — End: 2023-09-12
  Filled 2023-09-12: qty 10

## 2023-09-12 MED ORDER — 0.9 % SODIUM CHLORIDE (POUR BTL) OPTIME
TOPICAL | Status: DC | PRN
  Administered 2023-09-12: 5000 mL

## 2023-09-12 MED ORDER — METOPROLOL TARTRATE 5 MG/5ML IV SOLN
INTRAVENOUS | Status: AC
Start: 2023-09-12 — End: 2023-09-12
  Filled 2023-09-12: qty 5

## 2023-09-12 MED ORDER — NOREPINEPHRINE 4 MG/250ML-% IV SOLN: 0.0000 ug/min | INTRAVENOUS | Status: DC

## 2023-09-12 MED ORDER — SODIUM CHLORIDE 0.9% FLUSH: 3.0000 mL | INTRAVENOUS | Status: DC | PRN

## 2023-09-12 MED ORDER — METOPROLOL TARTRATE 5 MG/5ML IV SOLN
INTRAVENOUS | Status: DC | PRN
Start: 2023-09-12 — End: 2023-09-12
  Administered 2023-09-12: 1 mg via INTRAVENOUS

## 2023-09-12 MED ORDER — SODIUM CHLORIDE (PF) 0.9 % IJ SOLN
INTRAMUSCULAR | Status: AC
  Filled 2023-09-12: qty 10

## 2023-09-12 MED ORDER — CEFAZOLIN SODIUM-DEXTROSE 2-4 GM/100ML-% IV SOLN
2.0000 g | Freq: Three times a day (TID) | INTRAVENOUS | Status: AC
  Administered 2023-09-12 – 2023-09-14 (×6): 2 g via INTRAVENOUS
  Filled 2023-09-12 (×6): qty 100

## 2023-09-12 MED ORDER — ORAL CARE MOUTH RINSE: 15.0000 mL | Freq: Once | OROMUCOSAL | Status: DC

## 2023-09-12 MED ORDER — MILRINONE LACTATE IN DEXTROSE 20-5 MG/100ML-% IV SOLN
0.3000 ug/kg/min | INTRAVENOUS | Status: DC
  Filled 2023-09-12: qty 100

## 2023-09-12 MED ORDER — PROTAMINE SULFATE 10 MG/ML IV SOLN
INTRAVENOUS | Status: DC | PRN
  Administered 2023-09-12: 90 mg via INTRAVENOUS

## 2023-09-12 MED ORDER — ASPIRIN 81 MG PO CHEW
324.0000 mg | CHEWABLE_TABLET | Freq: Every day | ORAL | Status: DC
  Filled 2023-09-12: qty 4

## 2023-09-12 MED ORDER — METOPROLOL TARTRATE 25 MG/10 ML ORAL SUSPENSION: 12.5000 mg | Freq: Two times a day (BID) | ORAL | Status: DC

## 2023-09-12 MED ORDER — ASPIRIN 81 MG PO CHEW
324.0000 mg | CHEWABLE_TABLET | Freq: Once | ORAL | Status: AC
  Administered 2023-09-12: 324 mg via ORAL
  Filled 2023-09-12: qty 4

## 2023-09-12 MED ORDER — SODIUM CHLORIDE (PF) 0.9 % IJ SOLN: OROMUCOSAL | Status: DC | PRN

## 2023-09-12 MED ORDER — ACETAMINOPHEN 160 MG/5ML PO SOLN: 1000.0000 mg | Freq: Four times a day (QID) | ORAL | Status: DC

## 2023-09-12 MED ORDER — ORAL CARE MOUTH RINSE: 15.0000 mL | OROMUCOSAL | Status: DC | PRN

## 2023-09-12 MED ORDER — DEXTROSE 50 % IV SOLN: 0.0000 mL | INTRAVENOUS | Status: DC | PRN

## 2023-09-12 MED ORDER — DEXMEDETOMIDINE HCL IN NACL 400 MCG/100ML IV SOLN: 0.0000 ug/kg/h | INTRAVENOUS | Status: DC

## 2023-09-12 MED ORDER — CEFAZOLIN SODIUM-DEXTROSE 2-4 GM/100ML-% IV SOLN
2.0000 g | INTRAVENOUS | Status: AC
  Administered 2023-09-12: 2 g via INTRAVENOUS
  Filled 2023-09-12: qty 100

## 2023-09-12 MED ORDER — SODIUM CHLORIDE 0.9 % IV SOLN: 250.0000 mL | INTRAVENOUS | Status: AC

## 2023-09-12 MED ORDER — LIDOCAINE HCL (CARDIAC) PF 100 MG/5ML IV SOSY
PREFILLED_SYRINGE | INTRAVENOUS | Status: DC | PRN
  Administered 2023-09-12: 100 mg via INTRAVENOUS

## 2023-09-12 MED ORDER — ETOMIDATE 2 MG/ML IV SOLN
INTRAVENOUS | Status: DC | PRN
  Administered 2023-09-12: 16 mg via INTRAVENOUS

## 2023-09-12 MED ORDER — VASOPRESSIN 20 UNIT/ML IV SOLN
INTRAVENOUS | Status: AC
  Filled 2023-09-12: qty 1

## 2023-09-12 MED ORDER — MANNITOL 20 % IV SOLN
INTRAVENOUS | Status: DC
  Filled 2023-09-12: qty 13

## 2023-09-12 MED ORDER — PANTOPRAZOLE SODIUM 40 MG IV SOLR
40.0000 mg | Freq: Every day | INTRAVENOUS | Status: AC
  Administered 2023-09-12 – 2023-09-13 (×2): 40 mg via INTRAVENOUS
  Filled 2023-09-12 (×2): qty 10

## 2023-09-12 MED ORDER — POTASSIUM CHLORIDE 10 MEQ/50ML IV SOLN
10.0000 meq | INTRAVENOUS | Status: AC
  Administered 2023-09-12 (×3): 10 meq via INTRAVENOUS

## 2023-09-12 MED ORDER — TRANEXAMIC ACID 1000 MG/10ML IV SOLN
1.5000 mg/kg/h | INTRAVENOUS | Status: AC
  Administered 2023-09-12: 1.5 mg/kg/h via INTRAVENOUS
  Filled 2023-09-12: qty 25

## 2023-09-12 MED ORDER — SODIUM CHLORIDE 0.9% FLUSH
3.0000 mL | Freq: Two times a day (BID) | INTRAVENOUS | Status: DC
  Administered 2023-09-12 – 2023-09-17 (×4): 10 mL via INTRAVENOUS
  Administered 2023-09-17: 3 mL via INTRAVENOUS
  Administered 2023-09-18: 10 mL via INTRAVENOUS
  Administered 2023-09-18: 3 mL via INTRAVENOUS

## 2023-09-12 MED ORDER — METOPROLOL TARTRATE 5 MG/5ML IV SOLN: 2.5000 mg | INTRAVENOUS | Status: DC | PRN

## 2023-09-12 MED ORDER — CHLORHEXIDINE GLUCONATE 0.12 % MT SOLN
15.0000 mL | Freq: Once | OROMUCOSAL | Status: AC
  Administered 2023-09-12: 15 mL via OROMUCOSAL
  Filled 2023-09-12: qty 15

## 2023-09-12 MED ORDER — HEPARIN SODIUM (PORCINE) 1000 UNIT/ML IJ SOLN
INTRAMUSCULAR | Status: AC
  Filled 2023-09-12: qty 1

## 2023-09-12 MED ORDER — INSULIN REGULAR(HUMAN) IN NACL 100-0.9 UT/100ML-% IV SOLN
INTRAVENOUS | Status: DC
  Administered 2023-09-12: 8 [IU]/h via INTRAVENOUS

## 2023-09-12 MED ORDER — MAGNESIUM SULFATE 4 GM/100ML IV SOLN
4.0000 g | Freq: Once | INTRAVENOUS | Status: AC
Start: 2023-09-12 — End: 2023-09-12
  Administered 2023-09-12: 4 g via INTRAVENOUS
  Filled 2023-09-12: qty 100

## 2023-09-12 MED ORDER — VASOPRESSIN 20 UNITS/100 ML INFUSION FOR SHOCK
0.0000 [IU]/min | INTRAVENOUS | Status: DC
  Administered 2023-09-13: 0.04 [IU]/min via INTRAVENOUS
  Administered 2023-09-13 – 2023-09-16 (×9): 0.03 [IU]/min via INTRAVENOUS
  Filled 2023-09-12 (×4): qty 100
  Filled 2023-09-12: qty 200
  Filled 2023-09-12 (×3): qty 100
  Filled 2023-09-12: qty 200
  Filled 2023-09-12: qty 100

## 2023-09-12 MED ORDER — CEFAZOLIN SODIUM-DEXTROSE 2-4 GM/100ML-% IV SOLN
2.0000 g | INTRAVENOUS | Status: DC
  Filled 2023-09-12: qty 100

## 2023-09-12 MED ORDER — DEXMEDETOMIDINE HCL IN NACL 400 MCG/100ML IV SOLN
0.1000 ug/kg/h | INTRAVENOUS | Status: AC
  Administered 2023-09-12: .5 ug/kg/h via INTRAVENOUS
  Filled 2023-09-12: qty 100

## 2023-09-12 MED ORDER — NITROGLYCERIN IN D5W 200-5 MCG/ML-% IV SOLN
2.0000 ug/min | INTRAVENOUS | Status: DC
  Filled 2023-09-12: qty 250

## 2023-09-12 MED ORDER — SODIUM CHLORIDE 0.9% FLUSH
3.0000 mL | Freq: Two times a day (BID) | INTRAVENOUS | Status: DC
  Administered 2023-09-12 – 2023-09-17 (×11): 10 mL via INTRAVENOUS
  Administered 2023-09-18: 3 mL via INTRAVENOUS
  Administered 2023-09-18: 10 mL via INTRAVENOUS

## 2023-09-12 MED ORDER — POTASSIUM CHLORIDE 2 MEQ/ML IV SOLN
80.0000 meq | INTRAVENOUS | Status: DC
  Filled 2023-09-12 (×2): qty 40

## 2023-09-12 MED ORDER — MIDAZOLAM HCL (PF) 5 MG/ML IJ SOLN
INTRAMUSCULAR | Status: DC | PRN
  Administered 2023-09-12: 1 mg via INTRAVENOUS
  Administered 2023-09-12: 2 mg via INTRAVENOUS
  Administered 2023-09-12 (×2): 1 mg via INTRAVENOUS

## 2023-09-12 MED ORDER — ACETAMINOPHEN 10 MG/ML IV SOLN
1000.0000 mg | Freq: Four times a day (QID) | INTRAVENOUS | Status: AC
  Administered 2023-09-12 – 2023-09-13 (×4): 1000 mg via INTRAVENOUS
  Filled 2023-09-12 (×4): qty 100

## 2023-09-12 MED ORDER — SODIUM CHLORIDE 0.9% FLUSH
3.0000 mL | Freq: Two times a day (BID) | INTRAVENOUS | Status: DC
  Administered 2023-09-13 – 2023-09-18 (×11): 3 mL via INTRAVENOUS

## 2023-09-12 MED ORDER — ALBUMIN HUMAN 5 % IV SOLN
250.0000 mL | INTRAVENOUS | Status: DC | PRN
  Administered 2023-09-12 – 2023-09-13 (×2): 12.5 g via INTRAVENOUS
  Filled 2023-09-12: qty 250

## 2023-09-12 MED ORDER — ACETAMINOPHEN 160 MG/5ML PO SOLN
1000.0000 mg | Freq: Four times a day (QID) | ORAL | Status: AC
  Filled 2023-09-12: qty 40.6

## 2023-09-12 MED ORDER — MAGNESIUM SULFATE 50 % IJ SOLN
40.0000 meq | INTRAMUSCULAR | Status: DC
  Filled 2023-09-12: qty 9.85

## 2023-09-12 MED ORDER — PLASMA-LYTE A IV SOLN
INTRAVENOUS | Status: AC
  Administered 2023-09-12: 500 mL
  Filled 2023-09-12: qty 2.5

## 2023-09-12 MED ORDER — ROCURONIUM BROMIDE 10 MG/ML (PF) SYRINGE
PREFILLED_SYRINGE | INTRAVENOUS | Status: DC | PRN
  Administered 2023-09-12: 20 mg via INTRAVENOUS
  Administered 2023-09-12: 100 mg via INTRAVENOUS

## 2023-09-12 MED ORDER — LACTATED RINGERS IV SOLN: INTRAVENOUS | Status: DC

## 2023-09-12 MED ORDER — NOREPINEPHRINE 4 MG/250ML-% IV SOLN
0.0000 ug/min | INTRAVENOUS | Status: AC
  Administered 2023-09-12: 4 ug/min via INTRAVENOUS
  Filled 2023-09-12: qty 250

## 2023-09-12 MED ORDER — NITROGLYCERIN IN D5W 200-5 MCG/ML-% IV SOLN: 0.0000 ug/min | INTRAVENOUS | Status: DC

## 2023-09-12 MED ORDER — PHENYLEPHRINE 80 MCG/ML (10ML) SYRINGE FOR IV PUSH (FOR BLOOD PRESSURE SUPPORT)
PREFILLED_SYRINGE | INTRAVENOUS | Status: AC
Start: 2023-09-12 — End: 2023-09-12
  Filled 2023-09-12: qty 10

## 2023-09-12 MED ORDER — FUROSEMIDE 10 MG/ML IJ SOLN
80.0000 mg | Freq: Once | INTRAMUSCULAR | Status: AC
  Administered 2023-09-12: 80 mg via INTRAVENOUS
  Filled 2023-09-12: qty 8

## 2023-09-12 MED ORDER — ACETAMINOPHEN 500 MG PO TABS
1000.0000 mg | ORAL_TABLET | Freq: Four times a day (QID) | ORAL | Status: AC
  Administered 2023-09-13 – 2023-09-17 (×10): 1000 mg via ORAL
  Filled 2023-09-12 (×14): qty 2

## 2023-09-12 MED ORDER — PHENYLEPHRINE HCL-NACL 20-0.9 MG/250ML-% IV SOLN
30.0000 ug/min | INTRAVENOUS | Status: DC
  Filled 2023-09-12: qty 250

## 2023-09-12 MED ORDER — MORPHINE SULFATE (PF) 2 MG/ML IV SOLN
1.0000 mg | INTRAVENOUS | Status: DC | PRN
  Administered 2023-09-12: 2 mg via INTRAVENOUS
  Filled 2023-09-12: qty 1

## 2023-09-12 MED ORDER — METOCLOPRAMIDE HCL 5 MG/ML IJ SOLN
10.0000 mg | Freq: Four times a day (QID) | INTRAMUSCULAR | Status: AC
Start: 2023-09-12 — End: 2023-09-13
  Administered 2023-09-12 – 2023-09-13 (×6): 10 mg via INTRAVENOUS
  Filled 2023-09-12 (×6): qty 2

## 2023-09-12 MED ORDER — CHLORHEXIDINE GLUCONATE CLOTH 2 % EX PADS
6.0000 | MEDICATED_PAD | Freq: Every day | CUTANEOUS | Status: DC
  Administered 2023-09-12 – 2023-09-23 (×11): 6 via TOPICAL

## 2023-09-12 MED ORDER — ORAL CARE MOUTH RINSE: 15.0000 mL | OROMUCOSAL | Status: DC

## 2023-09-12 MED ORDER — VANCOMYCIN HCL IN DEXTROSE 1-5 GM/200ML-% IV SOLN
1000.0000 mg | Freq: Once | INTRAVENOUS | Status: AC
  Administered 2023-09-12: 1000 mg via INTRAVENOUS
  Filled 2023-09-12: qty 200

## 2023-09-12 MED ORDER — SODIUM BICARBONATE 8.4 % IV SOLN: 50.0000 meq | Freq: Once | INTRAVENOUS | Status: AC

## 2023-09-12 MED ORDER — METHOCARBAMOL 1000 MG/10ML IJ SOLN
500.0000 mg | Freq: Three times a day (TID) | INTRAMUSCULAR | Status: DC
  Administered 2023-09-12 – 2023-09-13 (×3): 500 mg via INTRAVENOUS
  Filled 2023-09-12 (×3): qty 10

## 2023-09-12 MED ORDER — VASOPRESSIN 20 UNITS/100 ML INFUSION FOR SHOCK
INTRAVENOUS | Status: DC | PRN
  Administered 2023-09-12: .03 [IU]/min via INTRAVENOUS

## 2023-09-12 MED ORDER — PROPOFOL 10 MG/ML IV BOLUS
INTRAVENOUS | Status: AC
  Filled 2023-09-12: qty 20

## 2023-09-12 MED ORDER — BISACODYL 5 MG PO TBEC
10.0000 mg | DELAYED_RELEASE_TABLET | Freq: Every day | ORAL | Status: DC
  Administered 2023-09-13 – 2023-09-20 (×6): 10 mg via ORAL
  Filled 2023-09-12 (×9): qty 2

## 2023-09-12 MED ORDER — DOCUSATE SODIUM 100 MG PO CAPS
200.0000 mg | ORAL_CAPSULE | Freq: Every day | ORAL | Status: DC
  Administered 2023-09-13 – 2023-09-20 (×6): 200 mg via ORAL
  Filled 2023-09-12 (×8): qty 2

## 2023-09-12 MED ORDER — NICARDIPINE HCL IN NACL 20-0.86 MG/200ML-% IV SOLN: 0.0000 mg/h | INTRAVENOUS | Status: DC

## 2023-09-12 MED ORDER — OXYCODONE HCL 5 MG PO TABS
5.0000 mg | ORAL_TABLET | ORAL | Status: DC | PRN
  Administered 2023-09-12 – 2023-09-14 (×7): 10 mg via ORAL
  Administered 2023-09-15: 5 mg via ORAL
  Administered 2023-09-15 – 2023-09-16 (×3): 10 mg via ORAL
  Filled 2023-09-12: qty 2
  Filled 2023-09-12: qty 1
  Filled 2023-09-12 (×9): qty 2

## 2023-09-12 MED ORDER — HEPARIN 30,000 UNITS/1000 ML (OHS) CELLSAVER SOLUTION
Status: DC
  Filled 2023-09-12: qty 1000

## 2023-09-12 MED ORDER — ACETAMINOPHEN 160 MG/5ML PO SOLN: 650.0000 mg | Freq: Once | ORAL | Status: AC

## 2023-09-12 MED ORDER — ALBUMIN HUMAN 25 % IV SOLN
25.0000 g | Freq: Four times a day (QID) | INTRAVENOUS | Status: AC
  Administered 2023-09-12 – 2023-09-13 (×3): 25 g via INTRAVENOUS
  Administered 2023-09-13: 12.5 g via INTRAVENOUS
  Filled 2023-09-12 (×4): qty 100

## 2023-09-12 MED ORDER — INSULIN REGULAR(HUMAN) IN NACL 100-0.9 UT/100ML-% IV SOLN
INTRAVENOUS | Status: AC
  Administered 2023-09-12: 3.2 [IU]/h via INTRAVENOUS
  Filled 2023-09-12: qty 100

## 2023-09-12 MED ORDER — TRANEXAMIC ACID (OHS) PUMP PRIME SOLUTION
2.0000 mg/kg | INTRAVENOUS | Status: DC
  Filled 2023-09-12: qty 1.22

## 2023-09-12 MED ORDER — LIDOCAINE 2% (20 MG/ML) 5 ML SYRINGE
INTRAMUSCULAR | Status: AC
Start: 2023-09-12 — End: 2023-09-12
  Filled 2023-09-12: qty 5

## 2023-09-12 MED ORDER — MIDAZOLAM HCL (PF) 10 MG/2ML IJ SOLN
INTRAMUSCULAR | Status: AC
  Filled 2023-09-12: qty 2

## 2023-09-12 MED ORDER — EPINEPHRINE HCL 5 MG/250ML IV SOLN IN NS
0.0000 ug/min | INTRAVENOUS | Status: AC
  Administered 2023-09-12: 2 ug/min via INTRAVENOUS
  Filled 2023-09-12: qty 250

## 2023-09-12 MED ORDER — HYDROMORPHONE HCL 1 MG/ML IJ SOLN
0.5000 mg | INTRAMUSCULAR | Status: DC | PRN
  Administered 2023-09-13: 0.5 mg via INTRAVENOUS
  Filled 2023-09-12: qty 0.5

## 2023-09-12 SURGICAL SUPPLY — 70 items
ADAPTER MULTI PERFUSION 15 (ADAPTER) ×1 IMPLANT
BAG DECANTER FOR FLEXI CONT (MISCELLANEOUS) ×1 IMPLANT
BLADE CLIPPER SURG (BLADE) ×1 IMPLANT
BLADE STERNUM SYSTEM 6 (BLADE) ×1 IMPLANT
BLOWER MISTER CAL-MED (MISCELLANEOUS) IMPLANT
BNDG ELASTIC 6INX 5YD STR LF (GAUZE/BANDAGES/DRESSINGS) ×1 IMPLANT
BNDG GAUZE DERMACEA FLUFF 4 (GAUZE/BANDAGES/DRESSINGS) ×1 IMPLANT
CANISTER SUCTION 3000ML PPV (SUCTIONS) ×1 IMPLANT
CANNULA AORTIC ROOT 9FR (CANNULA) ×1 IMPLANT
CANNULA MC2 2 STG 29/37 NON-V (CANNULA) ×1 IMPLANT
CANNULA NON VENT 20FR 12 (CANNULA) ×1 IMPLANT
CATH ROBINSON RED A/P 18FR (CATHETERS) ×2 IMPLANT
CLIP RETRACTION 3.0MM CORONARY (MISCELLANEOUS) IMPLANT
CLIP TI MEDIUM 24 (CLIP) IMPLANT
CLIP TI WIDE RED SMALL 24 (CLIP) IMPLANT
CONNECTOR BLAKE 2:1 CARIO BLK (MISCELLANEOUS) ×1 IMPLANT
CONTAINER PROTECT SURGISLUSH (MISCELLANEOUS) ×2 IMPLANT
DRAIN CHANNEL 19F RND (DRAIN) ×3 IMPLANT
DRAIN CONNECTOR BLAKE 1:1 (MISCELLANEOUS) IMPLANT
DRAPE INCISE IOBAN 66X45 STRL (DRAPES) IMPLANT
DRAPE SRG 135X102X78XABS (DRAPES) ×1 IMPLANT
DRAPE WARM FLUID 44X44 (DRAPES) ×1 IMPLANT
DRESSING AQUACEL AG SP 3.5X10 (GAUZE/BANDAGES/DRESSINGS) IMPLANT
DRSG AQUACEL AG ADV 3.5X10 (GAUZE/BANDAGES/DRESSINGS) ×1 IMPLANT
ELECTRODE BLDE 4.0 EZ CLN MEGD (MISCELLANEOUS) ×1 IMPLANT
ELECTRODE REM PT RTRN 9FT ADLT (ELECTROSURGICAL) ×2 IMPLANT
FELT TEFLON 1X6 (MISCELLANEOUS) ×2 IMPLANT
GAUZE SPONGE 4X4 12PLY STRL (GAUZE/BANDAGES/DRESSINGS) ×2 IMPLANT
GLOVE BIO SURGEON STRL SZ7 (GLOVE) ×2 IMPLANT
GLOVE BIOGEL M STRL SZ7.5 (GLOVE) ×2 IMPLANT
GOWN STRL REUS W/ TWL LRG LVL3 (GOWN DISPOSABLE) ×4 IMPLANT
GOWN STRL REUS W/ TWL XL LVL3 (GOWN DISPOSABLE) ×2 IMPLANT
HEMOSTAT POWDER SURGIFOAM 1G (HEMOSTASIS) ×2 IMPLANT
INSERT SUTURE HOLDER (MISCELLANEOUS) ×1 IMPLANT
KIT BASIN OR (CUSTOM PROCEDURE TRAY) ×1 IMPLANT
KIT TURNOVER KIT B (KITS) ×1 IMPLANT
KIT VASOVIEW HEMOPRO 2 VH 4000 (KITS) ×1 IMPLANT
LEAD PACING MYOCARDI (MISCELLANEOUS) ×1 IMPLANT
LINE EXTENSION DELIVERY (MISCELLANEOUS) IMPLANT
MARKER DISTAL GRAFT W/ HOLDER (MISCELLANEOUS) ×3 IMPLANT
NS IRRIG 1000ML POUR BTL (IV SOLUTION) ×5 IMPLANT
OFFPUMP STABILIZER SUV (MISCELLANEOUS) IMPLANT
PACK E OPEN HEART (SUTURE) ×1 IMPLANT
PACK OPEN HEART (CUSTOM PROCEDURE TRAY) ×1 IMPLANT
PAD ARMBOARD POSITIONER FOAM (MISCELLANEOUS) ×2 IMPLANT
PAD ELECT DEFIB RADIOL ZOLL (MISCELLANEOUS) ×1 IMPLANT
PENCIL BUTTON HOLSTER BLD 10FT (ELECTRODE) ×1 IMPLANT
POSITIONER HEAD DONUT 9IN (MISCELLANEOUS) ×1 IMPLANT
PUNCH AORTIC ROTATE 4.0MM (MISCELLANEOUS) ×1 IMPLANT
SUPPORT HEART JANKE-BARRON (MISCELLANEOUS) ×1 IMPLANT
SUT ETHIBOND X763 2 0 SH 1 (SUTURE) ×2 IMPLANT
SUT MNCRL AB 3-0 PS2 18 (SUTURE) ×2 IMPLANT
SUT PDS AB 1 CTX 36 (SUTURE) ×2 IMPLANT
SUT PROLENE 4 0 SH DA (SUTURE) ×1 IMPLANT
SUT PROLENE 5 0 C 1 36 (SUTURE) ×3 IMPLANT
SUT PROLENE 7 0 BV1 MDA (SUTURE) ×1 IMPLANT
SUT STEEL 6MS V (SUTURE) ×2 IMPLANT
SYSTEM SAHARA CHEST DRAIN ATS (WOUND CARE) ×1 IMPLANT
TABLE PACK (MISCELLANEOUS) IMPLANT
TAPE CLOTH SURG 4X10 WHT LF (GAUZE/BANDAGES/DRESSINGS) IMPLANT
TAPE PAPER 2X10 WHT MICROPORE (GAUZE/BANDAGES/DRESSINGS) IMPLANT
TAPES RETRACTO (MISCELLANEOUS) IMPLANT
TOWEL GREEN STERILE (TOWEL DISPOSABLE) ×1 IMPLANT
TOWEL GREEN STERILE FF (TOWEL DISPOSABLE) ×1 IMPLANT
TRAY FOLEY SLVR 16FR TEMP STAT (SET/KITS/TRAYS/PACK) ×1 IMPLANT
TUBE SUCT INTRACARD DLP 20F (MISCELLANEOUS) ×1 IMPLANT
TUBE SUCTION CARDIAC 10FR (CANNULA) ×1 IMPLANT
TUBING LAP HI FLOW INSUFFLATIO (TUBING) ×1 IMPLANT
UNDERPAD 30X36 HEAVY ABSORB (UNDERPADS AND DIAPERS) ×1 IMPLANT
WATER STERILE IRR 1000ML POUR (IV SOLUTION) ×2 IMPLANT

## 2023-09-12 NOTE — Anesthesia Procedure Notes (Signed)
 Central Venous Catheter Insertion Performed by: Corinne Garnette BRAVO, MD, anesthesiologist Start/End7/14/2025 7:19 AM, 09/12/2023 7:25 AM Patient location: Pre-op. Preanesthetic checklist: patient identified, IV checked, site marked, risks and benefits discussed, surgical consent, monitors and equipment checked, pre-op evaluation and timeout performed Position: Trendelenburg Hand hygiene performed  and maximum sterile barriers used  Total catheter length 100. PA cath was placed.Swan type:thermodilution PA Cath depth:45 Procedure performed without using ultrasound guided technique. Attempts: 1 Patient tolerated the procedure well with no immediate complications.

## 2023-09-12 NOTE — Discharge Summary (Signed)
 [address] [place] [address]             [phone]        Physician Discharge Summary  Patient ID: Joel Pitts MRN: [id number] DOB/AGE: [birth year] 74 y.o.  Admit date: 09/06/2023 Discharge date: 09/23/2023  Admission Diagnoses:  Patient Active Problem List   Diagnosis Date Noted   Coronary artery disease 09/12/2023   Coronary artery disease involving native coronary artery of native heart without angina pectoris 09/10/2023   Ischemic cardiomyopathy 09/10/2023   Protein-calorie malnutrition, severe 09/08/2023   Type 2 diabetes mellitus (HCC) 09/07/2023   AKI (acute kidney injury) (HCC) 09/07/2023   Essential hypertension 09/07/2023   BPH (benign prostatic hyperplasia) 09/07/2023   Acute on chronic systolic CHF (congestive heart failure) (HCC) 09/06/2023     Discharge Diagnoses:  Patient Active Problem List   Diagnosis Date Noted   S/P CABG x 1 09/20/2023   Coronary artery disease 09/12/2023   Coronary artery disease involving native coronary artery of native heart without angina pectoris 09/10/2023   Ischemic cardiomyopathy 09/10/2023   Protein-calorie malnutrition, severe 09/08/2023   Type 2 diabetes mellitus (HCC) 09/07/2023   AKI (acute kidney injury) (HCC) 09/07/2023   Essential hypertension 09/07/2023   BPH (benign prostatic hyperplasia) 09/07/2023   Acute on chronic systolic CHF (congestive heart failure) (HCC) 09/06/2023    Discharged Condition: Stable  HPI: This is a 73 year old male with a past medical history of hypertension, BPH, diabetes mellitus, and nephrolithiasis who initially presented to his PCP with complaints of chest pain/tightness, shortness of breath, and fatigue for the past several weeks. PCP (Dr. Nanci)  recommended he present to the ED on 09/06/2023 for further evaluation.   EKG showed ST Anteroseptal infarct , age undetermined T wave abnormality, consider lateral ischemia. Initial Troponin I (high  sensitivity) was 29 and subsequently decreased. BNP was 2,251. Chest x ray showed low lung volumes, trace left pleural effusion. CTA showed no PE and bilateral moderate pleural effusions R>L. He was diuresed.    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 Cardiac catheterization done today showed a 99% stenosis of distal LM to proximal LAD. MRI viability study has been ordered. Cardiothoracic consultation was requested for consideration of coronary artery bypass grafting surgery. At the time of my surgery, patient denied chest pain or shortness of breath.  Dr. Shyrl discussed the need for coronary artery bypass grafting surgery (off pump). Potential risks, benefits, and complications of the surgery were discussed with the patient and he agreed to proceed with surgery.  Hospital Course: Patient underwent an off pump CABG x 1. He was transported from the OR to [facility] ICU in stable condition. He was extubated the afternoon of surgery. He was weaned off Dobutamine , Epinephrine , Milrinone , and Vasopressin  drips. He had AKI prior to surgery. Creatinine increased to 3.08 post op (off pump CABG); likely related to ATN.  He did not reqire CRRT and with time, creatinine did decrease. He was weaned off the Insulin  drip. His pre op HGA1C was 7.2. He was on a Lasix  drip to help with volume status. He was started on Heparin  for LV apical thrombus on 07/16. This was later transitioned to Eliquis . He was also started on Nitrous Oxide to help with pulmonary status. He did require several days of HFNC.  His respiratory status did improve with Nitric being weaned off on  7/19.  PT evaluation was performed and they recommended inpatient rehab, consult was requested.  His milrinone  was discontinued on 7/20.  His Co-ox remained stable.  He was constipated and treated with Sorbitol .  He was medically stable for transfer to the progressive care unit on 7/21.  His bowels began moving appropriately. He developed leukocytosis, UA was negative for UTI. The AHF team titrated GDMT as able. He did have a pericardial rub on exam, he was started on colchicine . Jardiance  was discontinued due to drop in Co-ox and increase in creatinine. He was started on Digoxin  and Spironolactone  was titrated per the AHF team. Leukocytosis resolved once colchicine  was started. Co-ox improved and he was at his preoperative weight, CVP and Co-ox was discontinued. He developed burning and itching along his sternal incision, benadryl  ointment was placed around the incision. He developed wheezing, Xoponex inhaler was started as needed. Insurance denied CIR. He did complain of burning and itching around his sternal incision, benadryl  cream was placed around the incision with some relief. This was discussed with Dr. Lucas and he felt this was normal postoperatively and would resolve with time. His bowels were moving appropriately and he was tolerating a diet. He was saturating well on room air with ambulation. His incisions were healing well without sign of infection. He would have help from friends and family at home. He was felt stable for discharge home with home health on 09/23/23.   Consults: pulmonary/intensive care  Significant Diagnostic Studies:  Narrative & Impression  CLINICAL DATA:  Congestive heart failure. Postoperative day 2 status post CABG   EXAM: PORTABLE CHEST 1 VIEW   COMPARISON:  09/13/2023   FINDINGS: Right IJ Swan-Ganz catheter tip: Right descending pulmonary artery.   Mediastinal drain and left chest tube noted.   Low lung volumes are present, causing crowding of the pulmonary vasculature. Improved aeration at the left lung base. Continued obscuration of the right hemidiaphragm favoring atelectasis over pneumonia; a small right pleural effusion cannot be excluded. There is some bandlike atelectasis in the lingula left lower lobe.   No definite  pneumothorax. Atherosclerotic calcification of the aortic arch.   IMPRESSION: 1. Improved aeration at the left lung base. 2. Continued obscuration of the right hemidiaphragm favoring atelectasis over pneumonia; a small right pleural effusion cannot be excluded. 3. Bandlike atelectasis in the lingula and left lower lobe. 4. Low lung volumes are present, causing crowding of the pulmonary vasculature. 5. Aortic Atherosclerosis (ICD10-I70.0).     Electronically Signed   By: Ryan Salvage M.D.   On: 09/14/2023 09:47   Treatments: surgery:  Off pump CABG X 1.  LIMA LAD by Dr. Shyrl on 09/12/2023.  Discharge Exam: Blood pressure 127/73, pulse 98, temperature 97.7 F (36.5 C), temperature source Oral, resp. rate 18, height 5' 6 (1.676 m), weight 60.6 kg, SpO2 98%. General appearance: alert, cooperative, and no distress Neurologic: intact Heart: regular rate and rhythm, no murmur, pericardial friction rub Lungs: slightly diminished bibasilar breath sounds Abdomen: soft, non-tender; bowel sounds normal; no masses,  no organomegaly Extremities: extremities normal, atraumatic, no cyanosis or edema Wound: Clean and dry without sign of infection, erythema along the incision but no purulent discharge, no rash or sign of allergic reaction, tender along incision  Discharge Medications:  The patient has been discharged on:   1.Beta Blocker:  Yes [   ]  No   [ X  ]                              If No, reason: no beta blocker due to low output per AHF team  2.Ace Inhibitor/ARB: Yes [  X ]                                     No  [    ]                                     If No, reason:   3.Statin:   Yes [   ]                  No  [ x  ]                  If No, reason: Allergy  4.Arleene:  Yes  [   x]                  No   [   ]                  If No, reason:  Patient had ACS upon admission: Suspected missed STEMI  Plavix/P2Y12 inhibitor: Yes [    ]                                      No  [  X ] On Eliquis  for apical thrombus     Discharge Instructions     Amb Referral to Cardiac Rehabilitation   Complete by: As directed    Diagnosis: CABG   CABG X ___: 1   After initial evaluation and assessments completed: Virtual Based Care may be provided alone or in conjunction with Phase 2 Cardiac Rehab based on patient barriers.: Yes   Intensive Cardiac Rehabilitation (ICR) [facility] location only OR Traditional Cardiac Rehabilitation (TCR) *If criteria for ICR are not met will enroll in TCR ([facility] only): Yes      Allergies as of 09/23/2023       Reactions   Statins Diarrhea        Medication List     STOP taking these medications    lisinopril  5 MG tablet Commonly known as: ZESTRIL        TAKE these medications    apixaban  5 MG Tabs tablet Commonly known as: ELIQUIS  Take 1 tablet (5 mg total) by mouth 2 (two) times daily.   aspirin  81 MG tablet Take 81 mg by mouth daily.   colchicine  0.6 MG tablet Take 1 tablet (0.6 mg total) by mouth daily.   digoxin  0.125 MG tablet Commonly known as: LANOXIN  Take 1 tablet (0.125 mg total) by mouth daily.   diphenhydrAMINE -zinc  acetate cream Commonly known as: BENADRYL  Apply topically 3 (three) times daily as needed for itching. Apply around sternal incision, DO NOT APPLY DIRECTLY ON INCISION   losartan  25 MG tablet Commonly known as: COZAAR  Take 1 tablet (25 mg total) by mouth daily.   metFORMIN 500 MG tablet Commonly known as: GLUCOPHAGE Take 500 mg by mouth 2 (two) times daily with a meal.   silodosin  8 MG Caps capsule Commonly known as: RAPAFLO Take 8 mg by mouth daily.   spironolactone  25 MG tablet Commonly known as: ALDACTONE  Take 1 tablet (25 mg total) by mouth daily.   traMADol  50 MG tablet Commonly known as: ULTRAM  Take 1 tablet (50 mg total) by mouth every 6 (six) hours as needed for moderate pain (pain score 4-6).   zolpidem 10 MG tablet Commonly known as:  AMBIEN Take 10 mg by mouth at bedtime.        Follow-up Information     Lightfoot, Linnie KIDD, MD Follow up.   Specialty: Cardiothoracic Surgery Why: Appointment is VIRTUAL;please do NOT go to the office. Dr. Shyrl will call you on 08/01 at 2:20 pm Contact information: [address] [place] [address] [phone]         [facility] Follow up.   Why: Home Health RN and Physical Therapy-agency will call to arrange appt Contact information: [phone]        Nanci Senior, MD Follow up.   Specialty: Family Medicine Why: please schedule hospital follow up appt to be seen within 7-14 days Contact information: [address] [place] [address] [phone]         [facility] Follow up on 10/06/2023.   Specialty: Cardiology Why: Follow up in the [facility] August 7th at 11:30am Contact information: [address] [place]   [phone]                Signed:  Con GORMAN Bend, PA-C 09/23/2023, 8:45 AM

## 2023-09-12 NOTE — Hospital Course (Addendum)
 HPI: This is a 73 year old male with a past medical history of hypertension, BPH, diabetes mellitus, and nephrolithiasis who initially presented to his PCP with complaints of chest pain/tightness, shortness of breath, and fatigue for the past several weeks. PCP (Dr. Nanci)  recommended he present to the ED on 09/06/2023 for further evaluation.   EKG showed ST Anteroseptal infarct , age undetermined T wave abnormality, consider lateral ischemia. Initial Troponin I (high sensitivity) was 29 and subsequently decreased. BNP was 2,251. Chest x ray showed low lung volumes, trace left pleural effusion. CTA showed no PE and bilateral moderate pleural effusions R>L. He was diuresed.    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 Cardiac catheterization done today showed a 99% stenosis of distal LM to proximal LAD. MRI viability study has been ordered. Cardiothoracic consultation was requested for consideration of coronary artery bypass grafting surgery. At the time of my surgery, patient denied chest pain or shortness of breath.  Dr. Shyrl discussed the need for coronary artery bypass grafting surgery (off pump). Potential risks, benefits, and complications of the surgery were discussed with the patient and he agreed to proceed with surgery.  Hospital Course: Patient underwent an off pump CABG x 1. He was transported from the OR to [facility] ICU in stable condition. He was extubated the afternoon of surgery. He was weaned off Dobutamine , Epinephrine , Milrinone , and Vasopressin  drips. He had AKI prior to surgery. Creatinine increased to 3.08 post op (off pump CABG); likely related to ATN.  He did not reqire CRRT and with time, creatinine did decrease. He was weaned off the Insulin  drip. His pre op HGA1C was 7.2. He was on a Lasix  drip to help with volume status. He was started on Heparin  for LV apical thrombus on 07/16. This was later  transitioned to Eliquis . He was also started on Nitrous Oxide to help with pulmonary status. He did require several days of HFNC.  His respiratory status did improve with Nitric being weaned off on 7/19.  PT evaluation was performed and they recommended inpatient rehab, consult was requested.  His milrinone  was discontinued on 7/20.  His Co-ox remained stable.  He was constipated and treated with Sorbitol .  He was medically stable for transfer to the progressive care unit on 7/21. His bowels began moving appropriately. He developed leukocytosis, UA was negative for UTI. The AHF team titrated GDMT as able. He did have a pericardial rub on exam, he was started on colchicine . Jardiance  was discontinued due to drop in Co-ox and increase in creatinine. He was started on Digoxin  and Spironolactone  was titrated per the AHF team. Leukocytosis resolved once colchicine  was started. Co-ox improved and he was at his preoperative weight, CVP and Co-ox was discontinued. He developed burning and itching along his sternal incision, benadryl  ointment was placed around the incision. He developed wheezing, Xoponex inhaler was started as needed. Insurance denied CIR. He did complain of burning and itching around his sternal incision, benadryl  cream was placed around the incision with some relief. This was discussed with Dr. Lucas and he felt this was normal postoperatively and would resolve with time. His bowels were moving appropriately and he was tolerating a diet. He was saturating well on room air with ambulation. His incisions were healing well without sign of infection. He would have help from friends and family at home. He was felt stable for discharge home with home health on 09/23/23.

## 2023-09-12 NOTE — Anesthesia Procedure Notes (Signed)
 Central Venous Catheter Insertion Performed by: Corinne Garnette BRAVO, MD, anesthesiologist Start/End7/14/2025 7:09 AM, 09/12/2023 7:19 AM Patient location: Pre-op. Preanesthetic checklist: patient identified, IV checked, site marked, risks and benefits discussed, surgical consent, monitors and equipment checked, pre-op evaluation, timeout performed and anesthesia consent Position: Trendelenburg Lidocaine  1% used for infiltration and patient sedated Hand hygiene performed , maximum sterile barriers used  and Seldinger technique used Catheter size: 9 Fr Central line was placed.MAC introducer Procedure performed using ultrasound guided technique. Ultrasound Notes:anatomy identified, needle tip was noted to be adjacent to the nerve/plexus identified, no ultrasound evidence of intravascular and/or intraneural injection and image(s) printed for medical record Attempts: 1 Following insertion, line sutured, dressing applied and Biopatch. Post procedure assessment: free fluid flow, blood return through all ports and no air  Patient tolerated the procedure well with no immediate complications.

## 2023-09-12 NOTE — Consult Note (Signed)
 NAME:  Joel Pitts, MRN:  [id number], DOB:  April 12, 1950, LOS: 6 ADMISSION DATE:  09/06/2023, CONSULTATION DATE:  09/12/23 REFERRING MD:  Dr. Shyrl, CHIEF COMPLAINT:  post op   History of Present Illness:   31 yoM with PMH of HTN, DM, BPH, and nephrolithiasis admitted on 7/8 for further workup of several week hx of SOB, fatigue, and chest tightness.  On workup, found to have EKG with age undetermined ST anteroseptal infarct, BNP 2.2k, Trop hs 29> 25, sCr 1.53, CTA neg for PE, bilateral pleural effusions seen, TTE showed EF < 20%, possible layered apical thrombus, LV GHK, RV mildly reduced, trivial MR.  Underwent diureses and heparin  gtt. Underwent 7/11 R/LHC which revealed 99% stenosis of distal LM to proximal LAD, cardiac MRI 7/11 showed significant LM and viable LAD territory except apex.  AHF and TCTS consulted.  Underwent CABG x 1, LIMA- LAD using left internal mammaary artery off CPB.  PCCM consulted for further vent and medical management while post-op in ICU.   Pertinent  Medical History   Past Medical History:  Diagnosis Date   Diabetes mellitus without complication (HCC)    History of kidney stones    ETOH- 1 beer/ night  Significant Hospital Events: Including procedures, antibiotic start and stop dates in addition to other pertinent events     Interim History / Subjective:  On 3rd albumin  Epi 2, NE 1-2, vaso 0.03, txa gtt, insulin , dex 0.5  Objective    Blood pressure 92/62, pulse 83, temperature 98 F (36.7 C), temperature source Oral, resp. rate 20, height 5' 6 (1.676 m), weight 60.9 kg, SpO2 96%. PAP: (41-44)/(5-19) 41/19      Intake/Output Summary (Last 24 hours) at 09/12/2023 1031 Last data filed at 09/12/2023 0959 Gross per 24 hour  Intake 1424.18 ml  Output 775 ml  Net 649.18 ml   Filed Weights   09/11/23 0516 09/12/23 0432 09/12/23 0644  Weight: 60.9 kg 61 kg 60.9 kg    Examination: General:  critically ill appearing elderly male sedated on MV in  NAD HEENT: MM pink/moist, ETT 8/ 22-23 at teeth, OGT, pupils 3/r, anicteric, R IJ PA cath Neuro: sedated CV: rr, NSR, slight rub, sternal dressing cdi, CT x 2 (30 ml out on arrival), left radial aline PULM:  MV supported, clear GI: soft, bs hypo, foley - cyu Extremities: warm/dry, no tibial edema, ecchymosis to RUE/ axilla area Skin: no rashes   CI 1.95 CO 3.3 PA 34/ 13 (22) CVP 5 On 3rd albumin  EBL 250 ml  Labs reviewed > sCr 1.1, K 3.8, iCa 1.17  Resolved problem list  AKI vs CKD Hyponatremia  Assessment and Plan   CAD s/p CABG x1, LIMA-LAD Acute systolic HF with volume overload, EF < 20% Apical LV thrombus  Bilateral pleural effusions, improved with diureses Acute postoperative respiratory insufficiency HTN HLD DMT2 with hyperglycemia  BPH P:  - post-op management per TCTS and per AHF  - rapid wean per TCTS protocol - VAP/ PPI - f/u CXR/ ABG, CBC, BMET, coags - con't weaning pressors as able to maintain MAP >70-90.  Preferred wean to Epi +/- vasopressin .  Albumin  boluses prn for low SV.  Trend hemodynamics> PA cath - tele monitoring- NSR - mediastinal drains per TCTS - ASA, statin to resume next day - resume anticoagulation per TCTS  - resume GDMT when appropriate, on pressors  - complete post-op antibiotics - insulin  gtt per Endotool - resume flomax  when taking PO's - monitor electrolytes, replete  PRN - multimodal pain control per protocol- oxycodone , tramadol , morphine  with bowel regimen  Best Practice (right click and Reselect all SmartList Selections daily)   Diet/type: NPO DVT prophylaxis SCD Pressure ulcer(s): pressure ulcer assessment deferred  GI prophylaxis: PPI Lines: Central line and Arterial Line Foley:  Yes, and it is still needed Code Status:  full code Last date of multidisciplinary goals of care discussion [per primary team]  Labs   CBC: Recent Labs  Lab 09/06/23 0746 09/09/23 0810 09/09/23 1111 09/10/23 0447 09/11/23 0514  09/12/23 0625 09/12/23 0825 09/12/23 0920 09/12/23 0957 09/12/23 1001  WBC 11.0* 8.2  --  7.6 6.9 6.5  --   --   --   --   HGB 17.9* 19.2*   < > 18.3* 17.3* 17.7* 16.3 14.6 15.3 16.3  HCT 50.8 53.7*   < > 52.5* 49.0 51.0 48.0 43.0 45.0 48.0  MCV 93.2 91.8  --  93.4 92.1 92.9  --   --   --   --   PLT 219 175  --  164 156 167  --   --   --   --    < > = values in this interval not displayed.    Basic Metabolic Panel: Recent Labs  Lab 09/08/23 0421 09/09/23 0810 09/09/23 1111 09/10/23 0447 09/11/23 0514 09/12/23 0625 09/12/23 0825 09/12/23 0920 09/12/23 0957 09/12/23 1001  NA 134* 136   < > 137 133* 132* 136 137 136 136  K 4.4 3.8   < > 3.6 4.0 4.7 4.2 3.8 3.7 3.8  CL 100 98  --  101 103 100 103 105  --  103  CO2 20* 24  --  25 20* 21*  --   --   --   --   GLUCOSE 149* 160*  --  169* 158* 154* 169* 194*  --  211*  BUN 19 27*  --  25* 20 17 19 17   --  19  CREATININE 1.51* 1.57*  --  1.34* 1.26* 1.30* 1.10 1.00  --  1.10  CALCIUM 8.6* 9.1  --  8.8* 8.6* 8.8*  --   --   --   --   MG 2.0 2.2  --   --   --   --   --   --   --   --    < > = values in this interval not displayed.   GFR: Estimated Creatinine Clearance: 50.8 mL/min (by C-G formula based on SCr of 1.1 mg/dL). Recent Labs  Lab 09/09/23 0810 09/10/23 0447 09/11/23 0514 09/12/23 0625  WBC 8.2 7.6 6.9 6.5    Liver Function Tests: No results for input(s): AST, ALT, ALKPHOS, BILITOT, PROT, ALBUMIN  in the last 168 hours. No results for input(s): LIPASE, AMYLASE in the last 168 hours. No results for input(s): AMMONIA in the last 168 hours.  ABG    Component Value Date/Time   PHART 7.318 (L) 09/12/2023 0957   PCO2ART 37.2 09/12/2023 0957   PO2ART 176 (H) 09/12/2023 0957   HCO3 19.1 (L) 09/12/2023 0957   TCO2 20 (L) 09/12/2023 1001   ACIDBASEDEF 6.0 (H) 09/12/2023 0957   O2SAT 99 09/12/2023 0957     Coagulation Profile: Recent Labs  Lab 09/11/23 0514  INR 1.1    Cardiac Enzymes: No  results for input(s): CKTOTAL, CKMB, CKMBINDEX, TROPONINI in the last 168 hours.  HbA1C: Hgb A1c MFr Bld  Date/Time Value Ref Range Status  09/09/2023 08:10 AM 7.2 (H) 4.8 -  5.6 % Final    Comment:    (NOTE)         Prediabetes: 5.7 - 6.4         Diabetes: >6.4         Glycemic control for adults with diabetes: <7.0     CBG: No results for input(s): GLUCAP in the last 168 hours.  Review of Systems:   Unable   Past Medical History:  He,  has a past medical history of Diabetes mellitus without complication (HCC) and History of kidney stones.   Surgical History:   Past Surgical History:  Procedure Laterality Date   LITHOTRIPSY     RIGHT/LEFT HEART CATH AND CORONARY ANGIOGRAPHY N/A 09/09/2023   Procedure: RIGHT/LEFT HEART CATH AND CORONARY ANGIOGRAPHY;  Surgeon: Rolan Ezra RAMAN, MD;  Location: [facility] INVASIVE CV LAB;  Service: Cardiovascular;  Laterality: N/A;     Social History:   reports that he has never smoked. He has never used smokeless tobacco. He reports current alcohol use of about 7.0 standard drinks of alcohol per week. He reports that he does not use drugs.   Family History:  His family history is not on file.   Allergies Allergies  Allergen Reactions   Statins Diarrhea     Home Medications  Prior to Admission medications   Medication Sig Start Date End Date Taking? Authorizing Provider  aspirin  81 MG tablet Take 81 mg by mouth daily.   Yes [provider]  lisinopril  (PRINIVIL ,ZESTRIL ) 5 MG tablet Take 5 mg by mouth every evening.    Yes [provider]  metFORMIN (GLUCOPHAGE) 500 MG tablet Take 500 mg by mouth 2 (two) times daily with a meal.   Yes [provider]  silodosin (RAPAFLO) 8 MG CAPS capsule Take 8 mg by mouth daily. 08/29/23  Yes [provider]  zolpidem (AMBIEN) 10 MG tablet Take 10 mg by mouth at bedtime.    [provider]     Critical care time: 45 mins       Lyle Pesa, MSN,  AG-ACNP-BC [facility] 09/12/2023, 11:38 AM  See Amion for pager If no response to pager , please call [phone] until 7pm After 7:00 pm call Elink  [phone]

## 2023-09-12 NOTE — Procedures (Signed)
 Extubation Procedure Note  Patient Details:   Name: Joel Pitts DOB: [birth year] MRN: [id number]   Airway Documentation:    Vent end date: 09/12/23 Vent end time: 1507   Evaluation  O2 sats: stable throughout Complications: No apparent complications Patient did tolerate procedure well. Bilateral Breath Sounds: Clear   Yes  Patient extubated per rapid wean protocol. NIF -25, VC . Positive cuff leak. Vitals are stable on 3L [place]. RN at bedside.  Brisha Mccabe H Kimani Bedoya 09/12/2023, 3:08 PM

## 2023-09-12 NOTE — Anesthesia Procedure Notes (Signed)
 Arterial Line Insertion Performed by: Lamar Lucie DASEN, CRNA, CRNA  Patient location: Pre-op. Preanesthetic checklist: patient identified, IV checked, site marked, risks and benefits discussed, surgical consent, monitors and equipment checked, pre-op evaluation, timeout performed and anesthesia consent Lidocaine  1% used for infiltration Left, radial was placed Catheter size: 20 G Hand hygiene performed  and maximum sterile barriers used   Attempts: 1 Procedure performed without using ultrasound guided technique. Following insertion, dressing applied and Biopatch. Post procedure assessment: normal and unchanged  Post procedure complications: hemorrhage. Patient tolerated the procedure well with no immediate complications.

## 2023-09-12 NOTE — Op Note (Signed)
 [address]       [name] [address]             [phone]                                         09/12/2023    Patient:  Joel Pitts Pre-Op Dx: coronary artery disease Congestive heart failure   DM Post-op Dx:  same Procedure: Off pump CABG X 1.  LIMA LAD      Surgeon and Role:      * Aarion Metzgar, Linnie KIDD, MD - Primary    * CHARM Donald, PA-C - assisting  An experienced assistant was required given the complexity of this surgery and the standard of surgical care. The assistant was needed for exposure, dissection, suctioning, retraction of delicate tissues and sutures, instrument exchange and for overall help during this procedure.     Anesthesia  general EBL:  250ml Blood Administration: none  Drains: 19 F blake drain: L, mediastinal  Wires: V Counts: correct   Indications: This is a 73 year old male with a past medical history of hypertension, BPH, diabetes mellitus, and nephrolithiasis who initially presented to his PCP with complaints of chest pain/tightness, shortness of breath, and fatigue for the past several weeks. PCP (Dr. Nanci)  recommended he present to the ED on 09/06/2023 for further evaluation.   EKG showed ST Anteroseptal infarct , age undetermined T wave abnormality, consider lateral ischemia. Initial Troponin I (high sensitivity) was 29 and subsequently decreased. BNP was 2,251. Chest x ray showed low lung volumes, trace left pleural effusion. CTA showed no PE and bilateral moderate pleural effusions R>L. He was diuresed.    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 Cardiac catheterization done today showed a 99% stenosis of distal LM to proximal LAD. MRI viability study has been ordered. Cardiothoracic consultation was requested for consideration of coronary artery bypass grafting surgery. At the time of my surgery, patient denied chest pain or  shortness of breath.  Findings: Good LIMA.  Good LAD  Operative Technique: After the risks, benefits and alternatives were thoroughly discussed, the patient was brought to the operative theatre.  All invasive lines were placed in pre-op holding.  Anesthesia was induced, and the patient was prepped and draped in normal sterile fashion.  An appropriate surgical pause was performed, and pre-operative antibiotics were dosed accordingly.  We began with an incision along the chest for the sternotomy.  In regards to the sternotomy, this was carried down with bovie cautery, and the sternum was divided with a reciprocating saw.  Meticulous hemostasis was obtained.  The left internal thoracic artery was exposed and harvested in in pedicled fashion.  The patient was systemically heparinized, and the artery was divided distally, and placed in a papaverine  sponge.    The sternal elevator was removed, and a retractor was placed.  The pericardium was divided in the midline and fashioned into a cradle with pericardial stitches.     Next, we exposed a good target on the LAD, and fashioned an end to side anastomosis between it and the LITA.  Meticulous hemostasis was obtained.    Hemostasis was obtained, and the heparin  was reversed with protamine .  Chest tubes and wires were placed, and the sternum was re-approximated with with sternal wires.  The soft  tissue and skin were re-approximated wth absorbable suture.    The patient tolerated the procedure without any immediate complications, and was transferred to the ICU in guarded condition.  Catlynn Grondahl MALVA Rayas

## 2023-09-12 NOTE — Brief Op Note (Signed)
 09/06/2023 - 09/12/2023  9:51 AM  PATIENT:  Joel Pitts  73 y.o. male  PRE-OPERATIVE DIAGNOSIS:  CORONARY ARTERY DISEASE  POST-OPERATIVE DIAGNOSIS:  CORONARY ARTERY DISEASE  PROCEDURE:  OFF PUMP CORONARY ARTERY BYPASS GRAFTING (CABG)  X  1 (LIMA to LAD), USING LEFT INTERNAL MAMMARY ARTERY   SURGEON:  Surgeons and Role:    Shyrl Linnie KIDD, MD - Primary  PHYSICIAN ASSISTANT: Kyla Donald PA-C  ASSISTANTS: Luke Mayotte RNFA   ANESTHESIA:   general  EBL: Per anesthesia, perfusion record  DRAINS: Chest tubes placed in the mediastinal and pleural spaces   COUNTS CORRECT:  YES  DICTATION: .Dragon Dictation  PLAN OF CARE: Admit to inpatient   PATIENT DISPOSITION:  ICU - intubated and hemodynamically stable.   Delay start of Pharmacological VTE agent (>24hrs) due to surgical blood loss or risk of bleeding: yes  BASELINE WEIGHT: 60.9 kg

## 2023-09-12 NOTE — Progress Notes (Signed)
 Advanced Heart Failure Rounding Note  Cardiologist: None  Chief Complaint: acute systolic heart failure\  Patient Profile   72 y/o male admitted w/ chest pain and new systolic heart failure and LV thrombus. EF 20%, RV mildly reduced. Cath w/ 99% stenosis of LAD, now s/p CABG x 1.   Subjective:    Just returned from OR. S/p off pump CABG x 1 LIMA-LAD.  Intubated and sedated.   On Epi 3 + VP 0.03. CI 2.2, CVP 6. Getting albumin   UOP appropriate.  Rhythm stable    Objective:   Weight Range: 60.9 kg Body mass index is 21.67 kg/m.   Vital Signs:   Temp:  [93 F (33.9 C)-98 F (36.7 C)] 94.3 F (34.6 C) (07/14 1155) Pulse Rate:  [78-95] 84 (07/14 1155) Resp:  [15-23] 17 (07/14 1155) BP: (92-113)/(62-85) 92/62 (07/14 1100) SpO2:  [92 %-99 %] 98 % (07/14 1155) Arterial Line BP: (96-120)/(41-51) 116/50 (07/14 1155) FiO2 (%):  [50 %] 50 % (07/14 1040) Weight:  [60.9 kg-61 kg] 60.9 kg (07/14 0644) Last BM Date : 09/10/23  Weight change: Filed Weights   09/11/23 0516 09/12/23 0432 09/12/23 0644  Weight: 60.9 kg 61 kg 60.9 kg    Intake/Output:   Intake/Output Summary (Last 24 hours) at 09/12/2023 1200 Last data filed at 09/12/2023 1039 Gross per 24 hour  Intake 1674.18 ml  Output 835 ml  Net 839.18 ml      Physical Exam    General:  intubated and sedated  HEENT: + ETT  Neck: Supple. JVP not elevated. + RIJ swan  Cor: RRR, + sternal dressing, + CTs  Lungs: intubated and clear  Abdomen: Soft, nontender, nondistended.  Extremities: No cyanosis, clubbing, rash, edema Neuro: intubated and sedated GU: + foley   Telemetry   NSR 90s, personally reviewed   EKG    NSR 78 bpm, personally reviewed   Labs    CBC Recent Labs    09/12/23 0625 09/12/23 0825 09/12/23 1001 09/12/23 1048  WBC 6.5  --   --  7.3  HGB 17.7*   < > 16.3 15.0  HCT 51.0   < > 48.0 43.6  MCV 92.9  --   --  94.0  PLT 167  --   --  127*   < > = values in this interval not  displayed.   Basic Metabolic Panel Recent Labs    92/86/74 0514 09/12/23 0625 09/12/23 0825 09/12/23 0920 09/12/23 0957 09/12/23 1001  NA 133* 132*   < > 137 136 136  K 4.0 4.7   < > 3.8 3.7 3.8  CL 103 100   < > 105  --  103  CO2 20* 21*  --   --   --   --   GLUCOSE 158* 154*   < > 194*  --  211*  BUN 20 17   < > 17  --  19  CREATININE 1.26* 1.30*   < > 1.00  --  1.10  CALCIUM 8.6* 8.8*  --   --   --   --    < > = values in this interval not displayed.   Liver Function Tests No results for input(s): AST, ALT, ALKPHOS, BILITOT, PROT, ALBUMIN  in the last 72 hours. No results for input(s): LIPASE, AMYLASE in the last 72 hours. Cardiac Enzymes No results for input(s): CKTOTAL, CKMB, CKMBINDEX, TROPONINI in the last 72 hours.  BNP: BNP (last 3 results) Recent Labs  09/06/23 0856  BNP 2,251.1*    ProBNP (last 3 results) No results for input(s): PROBNP in the last 8760 hours.   D-Dimer No results for input(s): DDIMER in the last 72 hours. Hemoglobin A1C No results for input(s): HGBA1C in the last 72 hours. Fasting Lipid Panel No results for input(s): CHOL, HDL, LDLCALC, TRIG, CHOLHDL, LDLDIRECT in the last 72 hours. Thyroid Function Tests No results for input(s): TSH, T4TOTAL, T3FREE, THYROIDAB in the last 72 hours.  Invalid input(s): FREET3  Other results:   Imaging    DG Chest Port 1 View Result Date: 09/12/2023 CLINICAL DATA:  Postop CABG EXAM: PORTABLE CHEST 1 VIEW COMPARISON:  September 11, 2023 FINDINGS: Endotracheal tube in place with tip in the mid trachea. Right IJ approach Swan-Ganz catheter with tip in the right ventricular outflow track mediastinal drain and left chest tube in place. Likely small layering right pleural effusion. Mild bibasilar subsegmental atelectasis. Sternotomy wires are intact. IMPRESSION: Swan-Ganz catheter with tip in the right ventricular outflow tract or proximal main pulmonary  artery. Remaining support devices in place. Layering right pleural effusion. Electronically Signed   By: Michaeline Blanch M.D.   On: 09/12/2023 11:20   VAS US  DOPPLER PRE CABG Result Date: 09/11/2023 PREOPERATIVE VASCULAR EVALUATION Patient Name:  Joel Pitts  Date of Exam:   09/11/2023 Medical Rec #: [id number]         Accession #:    [id number] Date of Birth: [birth year]         Patient Gender: M Patient Age:   57 years Exam Location:  [facility] Procedure:      VAS US  DOPPLER PRE CABG Referring Phys: MAUDE EMMER --------------------------------------------------------------------------------  Indications:      Pre-CABG. Risk Factors:     Hypertension, hyperlipidemia, Diabetes, no history of smoking,                   coronary artery disease. Other Factors:    ICM EF <20%, CHF. Comparison Study: No prior study on file Performing Technologist: Rachel Pellet RVS  Examination Guidelines: A complete evaluation includes B-mode imaging, spectral Doppler, color Doppler, and power Doppler as needed of all accessible portions of each vessel. Bilateral testing is considered an integral part of a complete examination. Limited examinations for reoccurring indications may be performed as noted.  Right Carotid Findings: +----------+--------+--------+--------+--------+------------------+           PSV cm/sEDV cm/sStenosisDescribeComments           +----------+--------+--------+--------+--------+------------------+ CCA Prox  95      9                       intimal thickening +----------+--------+--------+--------+--------+------------------+ CCA Distal69      11                      intimal thickening +----------+--------+--------+--------+--------+------------------+ ICA Prox  78      11      1-39%   calcificShadowing          +----------+--------+--------+--------+--------+------------------+ ICA Mid   50      12                                          +----------+--------+--------+--------+--------+------------------+ ICA Distal71      16                                         +----------+--------+--------+--------+--------+------------------+  ECA       86      11                                         +----------+--------+--------+--------+--------+------------------+ +----------+--------+-------+--------+------------+           PSV cm/sEDV cmsDescribeArm Pressure +----------+--------+-------+--------+------------+ Subclavian87                                  +----------+--------+-------+--------+------------+ +---------+--------+--+--------+--+ VertebralPSV cm/s57EDV cm/s13 +---------+--------+--+--------+--+ Left Carotid Findings: +----------+--------+--------+--------+------------+--------+           PSV cm/sEDV cm/sStenosisDescribe    Comments +----------+--------+--------+--------+------------+--------+ CCA Prox  88      11              heterogenous         +----------+--------+--------+--------+------------+--------+ CCA Distal79      14              calcific             +----------+--------+--------+--------+------------+--------+ ICA Prox  69      17      1-39%   heterogenous         +----------+--------+--------+--------+------------+--------+ ICA Mid   78      22                                   +----------+--------+--------+--------+------------+--------+ ICA Distal88      27                                   +----------+--------+--------+--------+------------+--------+ ECA       85      9                                    +----------+--------+--------+--------+------------+--------+  +----------+--------+--------+--------+------------+ SubclavianPSV cm/sEDV cm/sDescribeArm Pressure +----------+--------+--------+--------+------------+           101     12              113          +----------+--------+--------+--------+------------+  +---------+--------+--+--------+-+ VertebralPSV cm/s37EDV cm/s8 +---------+--------+--+--------+-+  ABI Findings: +------------------+-----+-----------+--------------------------------+ Rt Pressure (mmHg)IndexWaveform   Comment                          +------------------+-----+-----------+--------------------------------+                        triphasic  restricted/post right heart cath +------------------+-----+-----------+--------------------------------+ 124               1.10 multiphasic                                 +------------------+-----+-----------+--------------------------------+ 130               1.15 multiphasic                                 +------------------+-----+-----------+--------------------------------+ 61  0.54 Normal                                      +------------------+-----+-----------+--------------------------------+ +------------------+-----+-----------+ Lt Pressure (mmHg)IndexWaveform    +------------------+-----+-----------+ 113                    triphasic   +------------------+-----+-----------+ 127               1.12 multiphasic +------------------+-----+-----------+ 131               1.16 multiphasic +------------------+-----+-----------+ 63                0.56 Normal      +------------------+-----+-----------+ +-------+---------------+ ABI/TBIToday's ABI/TBI +-------+---------------+ Right  1.15/0.54       +-------+---------------+ Left   1.16/0.56       +-------+---------------+  Right Doppler Findings: +--------+---------+----------+ Site    Doppler  Comments   +--------+---------+----------+ Brachialtriphasicrestricted +--------+---------+----------+ Radial  triphasic           +--------+---------+----------+ Ulnar   triphasic           +--------+---------+----------+  Left Doppler Findings: +--------+--------+---------+ Site    PressureDoppler   +--------+--------+---------+  [id number]     triphasic +--------+--------+---------+ Radial          triphasic +--------+--------+---------+ Ulnar           triphasic +--------+--------+---------+   Summary: Right Carotid: Velocities in the right ICA are consistent with a 1-39% stenosis. Left Carotid: Velocities in the left ICA are consistent with a 1-39% stenosis. Vertebrals:  Bilateral vertebral arteries demonstrate antegrade flow. Subclavians: Normal flow hemodynamics were seen in bilateral subclavian              arteries. Right ABI: Resting right ankle-brachial index is within normal range. The right toe-brachial index is abnormal with normal waveforms. Left ABI: Resting left ankle-brachial index is within normal range. The left toe-brachial index is abnormal with normal waveforms. Right Upper Extremity: Doppler waveforms remain within normal limits with right radial compression. Doppler waveforms decrease >50% with right ulnar compression. Left Upper Extremity: Doppler waveforms decrease 50% with left radial compression. Doppler waveforms decrease 50% with left ulnar compression.  Electronically signed by Gaile New MD on 09/11/2023 at 7:51:55 PM.    Final    DG Chest 2 View Result Date: 09/11/2023 CLINICAL DATA:  [id number] Pre-op exam [id number] EXAM: CHEST - 2 VIEW COMPARISON:  September 06, 2023 FINDINGS: The cardiomediastinal silhouette is unchanged in contour. Small bilateral pleural effusions. No pneumothorax. No acute pleuroparenchymal abnormality. Atherosclerotic calcifications of the aorta. Visualized abdomen is unremarkable. Similar wedging of a vertebral body in the midthoracic spine. IMPRESSION: Small bilateral pleural effusions. Electronically Signed   By: Corean Salter M.D.   On: 09/11/2023 17:08     Medications:     Scheduled Medications:  [START ON 09/13/2023] acetaminophen   1,000 mg Oral Q6H   Or   [START ON 09/13/2023] acetaminophen  (TYLENOL ) oral liquid 160 mg/5 mL  1,000 mg Per Tube Q6H    acetaminophen  (TYLENOL ) oral liquid 160 mg/5 mL  650 mg Per Tube Once   aspirin   324 mg Oral Once   [START ON 09/13/2023] aspirin  EC  325 mg Oral Daily   Or   [START ON 09/13/2023] aspirin   324 mg Per Tube Daily   [START ON 09/13/2023] bisacodyl   10 mg Oral Daily   Or   [START ON 09/13/2023] bisacodyl   10 mg Rectal Daily   chlorhexidine   15 mL Mouth/Throat NOW   chlorhexidine   15 mL Mouth/Throat Once   Chlorhexidine  Gluconate Cloth  6 each Topical Daily   [START ON 09/13/2023] docusate sodium   200 mg Oral Daily   feeding supplement (NEPRO CARB STEADY)  237 mL Oral Daily   metoCLOPramide  (REGLAN ) injection  10 mg Intravenous Q6H   metoprolol  tartrate  12.5 mg Oral BID   Or   metoprolol  tartrate  12.5 mg Per Tube BID   mouth rinse  15 mL Mouth Rinse Q2H   [START ON 09/14/2023] pantoprazole   40 mg Oral Daily   pantoprazole  (PROTONIX ) IV  40 mg Intravenous QHS   [START ON 09/13/2023] sodium chloride  flush  3 mL Intravenous Q12H   sodium chloride  flush  3-10 mL Intravenous Q12H   sodium chloride  flush  3-10 mL Intravenous Q12H   sodium chloride  flush  3-10 mL Intravenous Q12H   sodium chloride  flush  3-10 mL Intravenous Q12H   tamsulosin   0.4 mg Oral PC supper    Infusions:  [START ON 09/13/2023] sodium chloride      albumin  human      ceFAZolin  (ANCEF ) IV 2 g (09/12/23 1108)   dexmedetomidine  (PRECEDEX ) IV infusion 0.5 mcg/kg/hr (09/12/23 1030)   epinephrine  2 mcg/min (09/12/23 1030)   insulin  8 Units/hr (09/12/23 1030)   magnesium  sulfate 4 g (09/12/23 1114)   niCARDipine      nitroGLYCERIN      norepinephrine  (LEVOPHED ) Adult infusion 0 mcg/min (09/12/23 1030)   potassium chloride  10 mEq (09/12/23 1150)   vancomycin      vasopressin  0.03 Units/min (09/12/23 1030)    PRN Medications: albumin  human, dextrose , metoprolol  tartrate, midazolam , morphine  injection, ondansetron  (ZOFRAN ) IV, mouth rinse, mouth rinse, oxyCODONE , [START ON 09/13/2023] sodium chloride  flush, sodium chloride  flush,  sodium chloride  flush, sodium chloride  flush, sodium chloride  flush, traMADol      Assessment/Plan   1. Acute systolic CHF: Symptoms for about 2 wks, no preceeding chest pain or viral syndrome. Echo showed EF 20%, diffuse hypokinesis, mild RV dysfunction, cannot rule out apical thrombus on contrast images.  HS-TnI minimally elevated at admission with no trend.  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  Based on LHC, this is an ischemic cardiomyopathy.  99% stenosis of the ostial LAD with TIMI 2 flow down the vessel and weak R>L collaterals.  Suspect unrecognized anterior MI about 2 wks ago with development of CHF.  RHC with low (but not markedly low) CI at 2.04, mildly elevated PCWP but normal filling pressures. cMRI suggested LAD territory viable.  - now s/p CABG x 1, LIMA-LAD  - just returned from OR, on Epi 3 + VP 0.03, CI 2.2, CVP 6  - wean pressors as tolerated - albumin  for volume  - once stable off pressors, will add back oral GDMT as tolerated   2. CAD: Suspect unrecognized anterior MI 2+ weeks ago.  LHC showed that the ostial LAD is severely diseased and heavily calcified. There is 99% stenosis with TIMI 2 flow down the LAD. There are weak collaterals from RCA to LAD. I reviewed films with Dr. Wonda. There is no good landing zone for stenting of ostial LAD, would require involvement of distal left main with possible jeopardization of the LCx. S/p off pump CABG 7/14 - continue ASA + statin   3. AKI :  ?CKD at baseline with DM2 and HTN. SCr 1.5 on admission, now improved - SCr 1.10 today  - support MAP w/ pressors - follow BMP and  UOP post op - holding SGLT2i perioperatively. Restart Jardiance  prior to d/c   4.  Apical thrombus: Noted on contrast images and somewhat equivocal. Confirmed by cMRI, layered LV apical mural thrombus  - restart a/c once ok by CT surgery    CRITICAL CARE Performed by: Caffie Shed   Total critical care time: 10 minutes  Critical care time was exclusive of  separately billable procedures and treating other patients.  Critical care was necessary to treat or prevent imminent or life-threatening deterioration.  Critical care was time spent personally by me on the following activities: development of treatment plan with patient and/or surrogate as well as nursing, discussions with consultants, evaluation of patient's response to treatment, examination of patient, obtaining history from patient or surrogate, ordering and performing treatments and interventions, ordering and review of laboratory studies, ordering and review of radiographic studies, pulse oximetry and re-evaluation of patient's condition.   Length of Stay: [address], PA-C  09/12/2023, 12:00 PM  Advanced Heart Failure Team Pager [phone] (M-F; 7a - 5p)  Please contact [facility] for night-coverage after hours (5p -7a ) and weekends on [email or web address]

## 2023-09-12 NOTE — Anesthesia Procedure Notes (Signed)
 Procedure Name: Intubation Date/Time: 09/12/2023 8:10 AM  Performed by: Lamar Lucie DASEN, CRNAPre-anesthesia Checklist: Patient identified, Emergency Drugs available, Suction available and Patient being monitored Patient Re-evaluated:Patient Re-evaluated prior to induction Oxygen Delivery Method: Circle system utilized Preoxygenation: Pre-oxygenation with 100% oxygen Induction Type: IV induction Ventilation: Mask ventilation without difficulty and Oral airway inserted - appropriate to patient size Laryngoscope Size: Miller and 3 Grade View: Grade I Tube type: Oral Tube size: 8.0 mm Number of attempts: 2 Airway Equipment and Method: Stylet and Oral airway Placement Confirmation: ETT inserted through vocal cords under direct vision, positive ETCO2 and breath sounds checked- equal and bilateral Secured at: 22 cm Tube secured with: Tape Dental Injury: Teeth and Oropharynx as per pre-operative assessment  Comments: Performed by [employer]

## 2023-09-12 NOTE — Anesthesia Preprocedure Evaluation (Signed)
 Anesthesia Evaluation  Patient identified by MRN, date of birth, ID band Patient awake    Reviewed: Allergy & Precautions, NPO status , Patient's Chart, lab work & pertinent test results  Airway Mallampati: II  TM Distance: >3 FB Neck ROM: Full    Dental  (+) Teeth Intact, Dental Advisory Given   Pulmonary neg pulmonary ROS   Pulmonary exam normal breath sounds clear to auscultation       Cardiovascular hypertension, Pt. on medications + CAD and +CHF  Normal cardiovascular exam Rhythm:Regular Rate:Normal  Echo 09/07/23:  Left Ventricle: Left ventricular ejection fraction, by estimation, is  <20%. The left ventricle has severely decreased function. The left  ventricle demonstrates global hypokinesis. Definity  contrast agent was  given IV to delineate the left ventricular  endocardial borders. The left ventricular internal cavity size was normal  in size. There is no left ventricular hypertrophy. Left ventricular  diastolic parameters are consistent with Grade II diastolic dysfunction  (pseudonormalization). Elevated left  atrial pressure.   Right Ventricle: The right ventricular size is normal. Right ventricular  systolic function is mildly reduced.   Left Atrium: Left atrial size was moderately dilated.   Right Atrium: Right atrial size was normal in size.   Pericardium: There is no evidence of pericardial effusion.   Mitral Valve: The mitral valve is normal in structure. Mild mitral annular  calcification. Trivial mitral valve regurgitation. No evidence of mitral  valve stenosis.   Tricuspid Valve: The tricuspid valve is normal in structure. Tricuspid  valve regurgitation is trivial. No evidence of tricuspid stenosis.   Aortic Valve: The aortic valve is tricuspid. Aortic valve regurgitation is  not visualized. No aortic stenosis is present. Aortic valve mean gradient  measures 2.0 mmHg. Aortic valve peak gradient  measures 3.1 mmHg. Aortic  valve area, by VTI measures 2.37  cm.   Pulmonic Valve: The pulmonic valve was normal in structure. Pulmonic valve  regurgitation is mild. No evidence of pulmonic stenosis.   Aorta: The aortic root is normal in size and structure.   Venous: The inferior vena cava is normal in size with greater than 50%  respiratory variability, suggesting right atrial pressure of 3 mmHg.   IAS/Shunts: No atrial level shunt detected by color flow Doppler. Agitated  saline contrast was given intravenously to evaluate for intracardiac  shunting. Agitated saline contrast bubble study was negative, with no  evidence of any interatrial shunt.   Additional Comments: On definity  images, there appears to be possible  layered apical thrombus; suggest cardiac MRI to further assess.     Neuro/Psych negative neurological ROS  negative psych ROS   GI/Hepatic negative GI ROS, Neg liver ROS,,,  Endo/Other  diabetes, Type 2, Oral Hypoglycemic Agents    Renal/GU Renal disease (AKI)     Musculoskeletal negative musculoskeletal ROS (+)    Abdominal   Peds  Hematology negative hematology ROS (+)   Anesthesia Other Findings Day of surgery medications reviewed with the patient.  Reproductive/Obstetrics                              Anesthesia Physical Anesthesia Plan  ASA: 4  Anesthesia Plan: General   Post-op Pain Management:    Induction: Intravenous  PONV Risk Score and Plan: 2 and Midazolam  and Treatment may vary due to age or medical condition  Airway Management Planned: Oral ETT  Additional Equipment: Arterial line, CVP, TEE and Ultrasound Guidance Line Placement  Intra-op Plan:   Post-operative Plan: Extubation in OR  Informed Consent: I have reviewed the patients History and Physical, chart, labs and discussed the procedure including the risks, benefits and alternatives for the proposed anesthesia with the patient or authorized  representative who has indicated his/her understanding and acceptance.     Dental advisory given  Plan Discussed with: CRNA  Anesthesia Plan Comments:          Anesthesia Quick Evaluation

## 2023-09-12 NOTE — Discharge Instructions (Addendum)
 Discharge Instructions:  1. You may shower, please wash incisions daily with soap and water and keep dry.  If you wish to cover wounds with dressing you may do so but please keep clean and change daily.  No tub baths or swimming until incisions have completely healed.  If your incisions become red or develop any drainage please call our office at [phone]  2. No Driving until cleared by Dr. Lang office and you are no longer using narcotic pain medications  3. Monitor your weight daily.. Please use the same scale and weigh at same time... If you gain 5-10 lbs in 48 hours with associated lower extremity swelling, please contact our office at [phone]  4. Fever of 101.5 for at least 24 hours with no source, please contact our office at [phone]  5. Activity- up as tolerated, please walk at least 3 times per day.  Avoid strenuous activity, no lifting, pushing, or pulling with your arms over 8-10 lbs for a minimum of 6 weeks  6. If any questions or concerns arise, please do not hesitate to contact our office at [phone] __________________________________________________________________  Information on my medicine - ELIQUIS  (apixaban )  This medication education was reviewed with me or my healthcare representative as part of my discharge preparation.   Why was Eliquis  prescribed for you? Eliquis  was prescribed for you to reduce the risk of forming blood clots that can cause a stroke if you have a medical condition called atrial fibrillation (a type of irregular heartbeat) OR to reduce the risk of a blood clots forming after orthopedic surgery.  What do You need to know about Eliquis  ? Take your Eliquis  TWICE DAILY - one tablet in the morning and one tablet in the evening with or without food.  It would be best to take the doses about the same time each day.  If you have difficulty swallowing the tablet whole please discuss with your pharmacist how to take the medication  safely.  Take Eliquis  exactly as prescribed by your doctor and DO NOT stop taking Eliquis  without talking to the doctor who prescribed the medication.  Stopping may increase your risk of developing a new clot or stroke.  Refill your prescription before you run out.  After discharge, you should have regular check-up appointments with your healthcare provider that is prescribing your Eliquis .  In the future your dose may need to be changed if your kidney function or weight changes by a significant amount or as you get older.  What do you do if you miss a dose? If you miss a dose, take it as soon as you remember on the same day and resume taking twice daily.  Do not take more than one dose of ELIQUIS  at the same time.  Important Safety Information A possible side effect of Eliquis  is bleeding. You should call your healthcare provider right away if you experience any of the following: Bleeding from an injury or your nose that does not stop. Unusual colored urine (red or dark brown) or unusual colored stools (red or black). Unusual bruising for unknown reasons. A serious fall or if you hit your head (even if there is no bleeding).  Some medicines may interact with Eliquis  and might increase your risk of bleeding or clotting while on Eliquis . To help avoid this, consult your healthcare provider or pharmacist prior to using any new prescription or non-prescription medications, including herbals, vitamins, non-steroidal anti-inflammatory drugs (NSAIDs) and supplements.  This website has more information on Eliquis  (  apixaban ): [email or web address] .com.

## 2023-09-12 NOTE — Transfer of Care (Signed)
 Immediate Anesthesia Transfer of Care Note  Patient: Joel Pitts  Procedure(s) Performed: OFF PUMP CORONARY ARTERY BYPASS GRAFTING X 1, USING LEFT INTERNAL MAMMARY ARTERY (Chest)  Patient Location: ICU  Anesthesia Type:General  Level of Consciousness: sedated and Patient remains intubated per anesthesia plan  Airway & Oxygen Therapy: Patient remains intubated per anesthesia plan and Patient placed on Ventilator (see vital sign flow sheet for setting)  Post-op Assessment: Report given to RN and Post -op Vital signs reviewed and stable  Post vital signs: Reviewed and stable  Last Vitals:  Vitals Value Taken Time  BP 97/45   Temp    Pulse 77 09/12/23 10:39  Resp 16 09/12/23 10:39  SpO2 97   Vitals shown include unfiled device data.  Last Pain:  Vitals:   09/12/23 0644  TempSrc: Oral  PainSc:       Patients Stated Pain Goal: 0 (09/09/23 1928)  Complications: No notable events documented.  Patient transported to ICU with standard monitors (HR, BP, SPO2, RR) and emergency drugs/equipment. Controlled ventilation maintained via ambu bag. Report given to bedside RN and respiratory therapist. Pt connected to ICU monitor and ventilator. All questions answered and vital signs stable before leaving

## 2023-09-12 NOTE — Progress Notes (Signed)
     [address]       [place] [address]             [phone]       No events  Vitals:   09/12/23 0736 09/12/23 0737  BP:    Pulse: 79 83  Resp: 17 20  Temp:    SpO2: 97% 96%   Alert NAD Sinus EWOB  OR today for CABG  Joel Pitts Joel Pitts

## 2023-09-13 ENCOUNTER — Inpatient Hospital Stay (HOSPITAL_COMMUNITY)

## 2023-09-13 ENCOUNTER — Encounter (HOSPITAL_COMMUNITY): Payer: Self-pay | Admitting: Thoracic Surgery (Cardiothoracic Vascular Surgery)

## 2023-09-13 DIAGNOSIS — I5023 Acute on chronic systolic (congestive) heart failure: Secondary | ICD-10-CM | POA: Diagnosis not present

## 2023-09-13 DIAGNOSIS — I10 Essential (primary) hypertension: Secondary | ICD-10-CM | POA: Diagnosis not present

## 2023-09-13 DIAGNOSIS — J90 Pleural effusion, not elsewhere classified: Secondary | ICD-10-CM | POA: Diagnosis not present

## 2023-09-13 DIAGNOSIS — I5021 Acute systolic (congestive) heart failure: Secondary | ICD-10-CM | POA: Diagnosis not present

## 2023-09-13 DIAGNOSIS — Z951 Presence of aortocoronary bypass graft: Secondary | ICD-10-CM

## 2023-09-13 DIAGNOSIS — E1165 Type 2 diabetes mellitus with hyperglycemia: Secondary | ICD-10-CM | POA: Diagnosis not present

## 2023-09-13 LAB — COOXEMETRY PANEL
Carboxyhemoglobin: 1.5 % (ref 0.5–1.5)
Carboxyhemoglobin: 2.3 % — ABNORMAL HIGH (ref 0.5–1.5)
Carboxyhemoglobin: 2.1 % — ABNORMAL HIGH (ref 0.5–1.5)
Methemoglobin: <0.7 % (ref 0.0–1.5)
Methemoglobin: 1.1 % (ref 0.0–1.5)
Methemoglobin: 1 % (ref 0.0–1.5)
O2 Saturation: 56.9 %
O2 Saturation: 60.4 %
O2 Saturation: 70.3 %
Total hemoglobin: 15.6 g/dL (ref 12.0–16.0)
Total hemoglobin: 14.7 g/dL (ref 12.0–16.0)
Total hemoglobin: 14.2 g/dL (ref 12.0–16.0)

## 2023-09-13 LAB — CBC
HCT: 44.5 % (ref 39.0–52.0)
HCT: 41.8 % (ref 39.0–52.0)
Hemoglobin: 15 g/dL (ref 13.0–17.0)
Hemoglobin: 13.9 g/dL (ref 13.0–17.0)
MCH: 32.7 pg (ref 26.0–34.0)
MCH: 32.6 pg (ref 26.0–34.0)
MCHC: 33.7 g/dL (ref 30.0–36.0)
MCHC: 33.3 g/dL (ref 30.0–36.0)
MCV: 96.9 fL (ref 80.0–100.0)
MCV: 98.1 fL (ref 80.0–100.0)
Platelets: 173 K/uL (ref 150–400)
Platelets: 106 K/uL — ABNORMAL LOW (ref 150–400)
RBC: 4.59 MIL/uL (ref 4.22–5.81)
RBC: 4.26 MIL/uL (ref 4.22–5.81)
RDW: 12.5 % (ref 11.5–15.5)
RDW: 12.9 % (ref 11.5–15.5)
WBC: 14.6 K/uL — ABNORMAL HIGH (ref 4.0–10.5)
WBC: 13.5 K/uL — ABNORMAL HIGH (ref 4.0–10.5)
nRBC: 0 % (ref 0.0–0.2)
nRBC: 0 % (ref 0.0–0.2)

## 2023-09-13 LAB — BASIC METABOLIC PANEL WITH GFR
Anion gap: 14 (ref 5–15)
Anion gap: 18 — ABNORMAL HIGH (ref 5–15)
BUN: 20 mg/dL (ref 8–23)
BUN: 25 mg/dL — ABNORMAL HIGH (ref 8–23)
CO2: 18 mmol/L — ABNORMAL LOW (ref 22–32)
CO2: 16 mmol/L — ABNORMAL LOW (ref 22–32)
Calcium: 8.3 mg/dL — ABNORMAL LOW (ref 8.9–10.3)
Calcium: 8.2 mg/dL — ABNORMAL LOW (ref 8.9–10.3)
Chloride: 102 mmol/L (ref 98–111)
Chloride: 98 mmol/L (ref 98–111)
Creatinine, Ser: 1.82 mg/dL — ABNORMAL HIGH (ref 0.61–1.24)
Creatinine, Ser: 2.29 mg/dL — ABNORMAL HIGH (ref 0.61–1.24)
GFR, Estimated: 38 mL/min — ABNORMAL LOW (ref >60)
GFR, Estimated: 29 mL/min — ABNORMAL LOW (ref >60)
Glucose, Bld: 139 mg/dL — ABNORMAL HIGH (ref 70–99)
Glucose, Bld: 152 mg/dL — ABNORMAL HIGH (ref 70–99)
Potassium: 4.5 mmol/L (ref 3.5–5.1)
Potassium: 5.2 mmol/L — ABNORMAL HIGH (ref 3.5–5.1)
Sodium: 134 mmol/L — ABNORMAL LOW (ref 135–145)
Sodium: 132 mmol/L — ABNORMAL LOW (ref 135–145)

## 2023-09-13 LAB — POCT I-STAT, CHEM 8
BUN: 25 mg/dL — ABNORMAL HIGH (ref 8–23)
Calcium, Ion: 1.07 mmol/L — ABNORMAL LOW (ref 1.15–1.40)
Chloride: 104 mmol/L (ref 98–111)
Creatinine, Ser: 2.1 mg/dL — ABNORMAL HIGH (ref 0.61–1.24)
Glucose, Bld: 191 mg/dL — ABNORMAL HIGH (ref 70–99)
HCT: 42 % (ref 39.0–52.0)
Hemoglobin: 14.3 g/dL (ref 13.0–17.0)
Potassium: 4.5 mmol/L (ref 3.5–5.1)
Sodium: 134 mmol/L — ABNORMAL LOW (ref 135–145)
TCO2: 15 mmol/L — ABNORMAL LOW (ref 22–32)

## 2023-09-13 LAB — GLUCOSE, CAPILLARY
Glucose-Capillary: 118 mg/dL — ABNORMAL HIGH (ref 70–99)
Glucose-Capillary: 123 mg/dL — ABNORMAL HIGH (ref 70–99)
Glucose-Capillary: 132 mg/dL — ABNORMAL HIGH (ref 70–99)
Glucose-Capillary: 138 mg/dL — ABNORMAL HIGH (ref 70–99)
Glucose-Capillary: 140 mg/dL — ABNORMAL HIGH (ref 70–99)
Glucose-Capillary: 152 mg/dL — ABNORMAL HIGH (ref 70–99)
Glucose-Capillary: 153 mg/dL — ABNORMAL HIGH (ref 70–99)
Glucose-Capillary: 184 mg/dL — ABNORMAL HIGH (ref 70–99)
Glucose-Capillary: 192 mg/dL — ABNORMAL HIGH (ref 70–99)
Glucose-Capillary: 194 mg/dL — ABNORMAL HIGH (ref 70–99)
Glucose-Capillary: 98 mg/dL (ref 70–99)

## 2023-09-13 LAB — CG4 I-STAT (LACTIC ACID)
Lactic Acid, Venous: 1.2 mmol/L (ref 0.5–1.9)
Lactic Acid, Venous: 1.2 mmol/L (ref 0.5–1.9)

## 2023-09-13 LAB — MAGNESIUM
Magnesium: 3 mg/dL — ABNORMAL HIGH (ref 1.7–2.4)
Magnesium: 2.7 mg/dL — ABNORMAL HIGH (ref 1.7–2.4)

## 2023-09-13 MED ORDER — METOLAZONE 5 MG PO TABS
5.0000 mg | ORAL_TABLET | Freq: Once | ORAL | Status: AC
  Administered 2023-09-13: 5 mg via ORAL
  Filled 2023-09-13: qty 1

## 2023-09-13 MED ORDER — INSULIN ASPART 100 UNIT/ML IJ SOLN
0.0000 [IU] | INTRAMUSCULAR | Status: DC
  Administered 2023-09-13 (×3): 4 [IU] via SUBCUTANEOUS
  Administered 2023-09-14 (×4): 2 [IU] via SUBCUTANEOUS
  Administered 2023-09-15: 8 [IU] via SUBCUTANEOUS
  Administered 2023-09-15: 4 [IU] via SUBCUTANEOUS
  Administered 2023-09-15: 2 [IU] via SUBCUTANEOUS
  Administered 2023-09-15: 12 [IU] via SUBCUTANEOUS
  Administered 2023-09-16 (×2): 8 [IU] via SUBCUTANEOUS
  Administered 2023-09-16 (×2): 2 [IU] via SUBCUTANEOUS
  Administered 2023-09-16: 20 [IU] via SUBCUTANEOUS
  Administered 2023-09-17: 8 [IU] via SUBCUTANEOUS
  Administered 2023-09-17 (×2): 4 [IU] via SUBCUTANEOUS
  Administered 2023-09-17 – 2023-09-18 (×4): 2 [IU] via SUBCUTANEOUS
  Administered 2023-09-18 (×2): 8 [IU] via SUBCUTANEOUS
  Administered 2023-09-18: 2 [IU] via SUBCUTANEOUS
  Administered 2023-09-19: 8 [IU] via SUBCUTANEOUS
  Administered 2023-09-19: 2 [IU] via SUBCUTANEOUS
  Administered 2023-09-19: 8 [IU] via SUBCUTANEOUS
  Administered 2023-09-19 (×2): 4 [IU] via SUBCUTANEOUS
  Administered 2023-09-19: 2 [IU] via SUBCUTANEOUS
  Administered 2023-09-20: 8 [IU] via SUBCUTANEOUS

## 2023-09-13 MED ORDER — DOBUTAMINE-DEXTROSE 4-5 MG/ML-% IV SOLN
INTRAVENOUS | Status: AC
  Administered 2023-09-13: 2.5 ug/kg/min via INTRAVENOUS
  Filled 2023-09-13: qty 250

## 2023-09-13 MED ORDER — FUROSEMIDE 10 MG/ML IJ SOLN
120.0000 mg | Freq: Once | INTRAVENOUS | Status: AC
  Administered 2023-09-13: 120 mg via INTRAVENOUS
  Filled 2023-09-13: qty 120

## 2023-09-13 MED ORDER — MILRINONE LACTATE IN DEXTROSE 20-5 MG/100ML-% IV SOLN
0.1250 ug/kg/min | INTRAVENOUS | Status: DC
  Administered 2023-09-13 – 2023-09-17 (×7): 0.25 ug/kg/min via INTRAVENOUS
  Filled 2023-09-13 (×2): qty 100
  Filled 2023-09-13: qty 200
  Filled 2023-09-13 (×3): qty 100

## 2023-09-13 MED ORDER — INSULIN GLARGINE-YFGN 100 UNIT/ML ~~LOC~~ SOLN
5.0000 [IU] | Freq: Two times a day (BID) | SUBCUTANEOUS | Status: DC
  Administered 2023-09-13 – 2023-09-18 (×11): 5 [IU] via SUBCUTANEOUS
  Filled 2023-09-13 (×13): qty 0.05

## 2023-09-13 MED ORDER — FUROSEMIDE 10 MG/ML IJ SOLN
15.0000 mg/h | INTRAVENOUS | Status: AC
  Administered 2023-09-13: 10 mg/h via INTRAVENOUS
  Administered 2023-09-13 – 2023-09-15 (×7): 30 mg/h via INTRAVENOUS
  Filled 2023-09-13 (×8): qty 20

## 2023-09-13 MED ORDER — DOBUTAMINE-DEXTROSE 4-5 MG/ML-% IV SOLN: 5.0000 ug/kg/min | INTRAVENOUS | Status: DC

## 2023-09-13 NOTE — Progress Notes (Signed)
   NAME:  Joel Pitts, MRN:  [id number], DOB:  1950-09-12, LOS: 7 ADMISSION DATE:  09/06/2023, CONSULTATION DATE:  09/12/23 REFERRING MD:  Dr. Shyrl, CHIEF COMPLAINT:  post op   History of Present Illness:   30 yoM with PMH of HTN, DM, BPH, and nephrolithiasis admitted on 7/8 for further workup of several week hx of SOB, fatigue, and chest tightness.  On workup, found to have EKG with age undetermined ST anteroseptal infarct, BNP 2.2k, Trop hs 29> 25, sCr 1.53, CTA neg for PE, bilateral pleural effusions seen, TTE showed EF < 20%, possible layered apical thrombus, LV GHK, RV mildly reduced, trivial MR.  Underwent diureses and heparin  gtt. Underwent 7/11 R/LHC which revealed 99% stenosis of distal LM to proximal LAD, cardiac MRI 7/11 showed significant LM and viable LAD territory except apex.  AHF and TCTS consulted.  Underwent CABG x 1, LIMA- LAD using left internal mammaary artery off CPB.  PCCM consulted for further vent and medical management while post-op in ICU.   Pertinent  Medical History   Past Medical History:  Diagnosis Date   Diabetes mellitus without complication (HCC)    History of kidney stones    ETOH- 1 beer/ night  Significant Hospital Events: Including procedures, antibiotic start and stop dates in addition to other pertinent events   CABG x 1 7/14 off pump  Interim History / Subjective:  Oliguric, increased WOB prompting CCM to be re-engaged.  Objective    Blood pressure (!) 120/55, pulse 78, temperature 98.2 F (36.8 C), resp. rate (!) 21, height 5' 6 (1.676 m), weight 68.6 kg, SpO2 91%. PAP: (31-70)/(10-33) 62/19 CVP:  [6 mmHg-34 mmHg] 10 mmHg PCWP:  [21 mmHg] 21 mmHg CO:  [2.1 L/min-4.5 L/min] 4.5 L/min CI:  [1.2 L/min/m2-2.7 L/min/m2] 2.7 L/min/m2  FiO2 (%):  [70 %] 70 %   Intake/Output Summary (Last 24 hours) at 09/13/2023 1833 Last data filed at 09/13/2023 1745 Gross per 24 hour  Intake 2019.49 ml  Output 1805 ml  Net 214.49 ml   Filed Weights    09/12/23 0432 09/12/23 0644 09/13/23 0500  Weight: 61 kg 60.9 kg 68.6 kg    Examination: Ill appearing No increased WOB Trace R and no left effusion, bilateral weak but moving diaphragms, R>L atelectasis on US  Bedside echo with some signs of worsening RV pressures congruent with swan numbers Aox3, weak, sternotomy looks okay  Labs/imaging reviewed worsening CHF type pattern plus atelectasis  Resolved problem list  AKI vs CKD Hyponatremia  Assessment and Plan   CAD s/p CABG x1, LIMA-LAD Acute systolic HF with volume overload, EF < 20% Apical LV thrombus  Postop cardioplegia R>L with ATN vs. Cardiorenal stubborn to diuresis Postop elevated PAPs HTN HLD DMT2 with hyperglycemia  BPH  P:  - Levo/vaso MAP 65 - Inotropes per AHF - Trial of HHFNC + iNO + high dose lasix  - Probably will end up needing CRRT, no immediate indications - Airway watch - Will follow  33 min cc time  Rolan Sharps MD PCCM

## 2023-09-13 NOTE — Progress Notes (Signed)
      [address]       [place] [address]             [phone]      POD # 1 CABG x 1  BP (!) 120/55   Pulse 78   Temp 98.2 F (36.8 C)   Resp (!) 21   Ht 5' 6 (1.676 m)   Wt 68.6 kg   SpO2 91%   BMI 24.41 kg/m  63/22 CI 2.67 Dobutamine  5 Epi 4 Milrinone  0.25 Vasopressin  0.03  Intake/Output Summary (Last 24 hours) at 09/13/2023 1815 Last data filed at 09/13/2023 1745 Gross per 24 hour  Intake 2019.49 ml  Output 1805 ml  Net 214.49 ml  Uo ~ 510 since 7 AM, currently 15-30 ml/hr on lasix  drip at 30  K 4.5 Creatinine 2.1 from 2.3 at 1445 (1.8 at 0400)  Continue current care D/w Dr. Bensimhon  Khristian Phillippi C. Kerrin, MD [facility] [phone]

## 2023-09-13 NOTE — Progress Notes (Addendum)
 Advanced Heart Failure Rounding Note  Cardiologist: None  Chief Complaint: acute systolic heart failure, post cardiotomy shock   Patient Profile   73 y/o male admitted w/ chest pain and new systolic heart failure and LV thrombus. EF 20%, RV mildly reduced. Cath w/ 99% stenosis of LAD, now s/p CABG x 1.   Subjective:     7/14: S/p off pump CABG x 1 LIMA-LAD. Extubated same day   POD#1   Continues on inotropic support. DBA added early this morning for low CI/Co-ox, started at 2.5 mcg. Now up to 5 mcg + Epi 6 + VP 0.03. Co-ox improving, 56>>now 60%. FICK CI remains low, 1.6. LA ok at 1.2. Filling pressures elevated, PAP 61/23.   SCr 1.25>>1.82. 1.5 L in UOP yesterday   Main complaint this morning is nausea. No appetite. No BM yet. Denies any significant resting dyspnea.     Objective:   Weight Range: 68.6 kg Body mass index is 24.41 kg/m.   Vital Signs:   Temp:  [93 F (33.9 C)-99.5 F (37.5 C)] 98.4 F (36.9 C) (07/15 0700) Pulse Rate:  [68-100] 70 (07/15 0700) Resp:  [15-36] 21 (07/15 0700) BP: (78-123)/(54-98) 110/59 (07/15 0700) SpO2:  [90 %-98 %] 96 % (07/15 0700) Arterial Line BP: (72-149)/(40-94) 140/54 (07/15 0700) FiO2 (%):  [40 %-50 %] 40 % (07/14 1428) Weight:  [68.6 kg] 68.6 kg (07/15 0500) Last BM Date : 09/10/23  Weight change: Filed Weights   09/12/23 0432 09/12/23 0644 09/13/23 0500  Weight: 61 kg 60.9 kg 68.6 kg    Intake/Output:   Intake/Output Summary (Last 24 hours) at 09/13/2023 0748 Last data filed at 09/13/2023 0700 Gross per 24 hour  Intake 2657.19 ml  Output 2080 ml  Net 577.19 ml      Physical Exam    General:  weak/fatigued appearing. No respiratory difficulty HEENT: normal Neck: +RIJ Swan, JVD elevated 12-14 cm  Cor: + sternal dressing, + CTs. Regular rate & rhythm. No rubs, gallops or murmurs. Lungs: decreased BS at the bases bilaterally  Abdomen: soft, nontender, nondistended. Hypoactive BS  Extremities: no cyanosis,  clubbing, rash, trace-1+ b/l LE edema Neuro: alert & oriented x 3, cranial nerves grossly intact. moves all 4 extremities w/o difficulty. Affect pleasant. GU: + foley   Telemetry   ? Junctional rhythm 60s, personally reviewed   EKG    Junctional rhythm w/ retrograde p waves, 70 bpm. Personally reviewed   Labs    CBC Recent Labs    09/12/23 1607 09/13/23 0405  WBC 15.3* 14.6*  NEUTROABS 11.3*  --   HGB 15.0 15.0  HCT 42.7 44.5  MCV 93.4 96.9  PLT 192 173   Basic Metabolic Panel Recent Labs    92/85/74 1607 09/13/23 0405  NA 136 134*  K 4.3 4.5  CL 103 102  CO2 20* 18*  GLUCOSE 103* 139*  BUN 16 20  CREATININE 1.25* 1.82*  CALCIUM 8.0* 8.3*  MG  --  3.0*   Liver Function Tests No results for input(s): AST, ALT, ALKPHOS, BILITOT, PROT, ALBUMIN  in the last 72 hours. No results for input(s): LIPASE, AMYLASE in the last 72 hours. Cardiac Enzymes No results for input(s): CKTOTAL, CKMB, CKMBINDEX, TROPONINI in the last 72 hours.  BNP: BNP (last 3 results) Recent Labs    09/06/23 0856  BNP 2,251.1*    ProBNP (last 3 results) No results for input(s): PROBNP in the last 8760 hours.   D-Dimer No results for input(s): DDIMER  in the last 72 hours. Hemoglobin A1C No results for input(s): HGBA1C in the last 72 hours. Fasting Lipid Panel No results for input(s): CHOL, HDL, LDLCALC, TRIG, CHOLHDL, LDLDIRECT in the last 72 hours. Thyroid Function Tests No results for input(s): TSH, T4TOTAL, T3FREE, THYROIDAB in the last 72 hours.  Invalid input(s): FREET3  Other results:   Imaging    DG Chest Port 1 View Result Date: 09/12/2023 CLINICAL DATA:  Postop CABG EXAM: PORTABLE CHEST 1 VIEW COMPARISON:  September 11, 2023 FINDINGS: Endotracheal tube in place with tip in the mid trachea. Right IJ approach Swan-Ganz catheter with tip in the right ventricular outflow track mediastinal drain and left chest tube in place.  Likely small layering right pleural effusion. Mild bibasilar subsegmental atelectasis. Sternotomy wires are intact. IMPRESSION: Swan-Ganz catheter with tip in the right ventricular outflow tract or proximal main pulmonary artery. Remaining support devices in place. Layering right pleural effusion. Electronically Signed   By: Michaeline Blanch M.D.   On: 09/12/2023 11:20     Medications:     Scheduled Medications:  acetaminophen   1,000 mg Oral Q6H   Or   acetaminophen  (TYLENOL ) oral liquid 160 mg/5 mL  1,000 mg Per Tube Q6H   aspirin  EC  325 mg Oral Daily   Or   aspirin   324 mg Per Tube Daily   bisacodyl   10 mg Oral Daily   Or   bisacodyl   10 mg Rectal Daily   Chlorhexidine  Gluconate Cloth  6 each Topical Daily   docusate sodium   200 mg Oral Daily   feeding supplement (NEPRO CARB STEADY)  237 mL Oral Daily   methocarbamol  (ROBAXIN ) injection  500 mg Intravenous Q8H   metoCLOPramide  (REGLAN ) injection  10 mg Intravenous Q6H   metoprolol  tartrate  12.5 mg Oral BID   Or   metoprolol  tartrate  12.5 mg Per Tube BID   [START ON 09/14/2023] pantoprazole   40 mg Oral Daily   pantoprazole  (PROTONIX ) IV  40 mg Intravenous QHS   sodium chloride  flush  3 mL Intravenous Q12H   sodium chloride  flush  3-10 mL Intravenous Q12H   sodium chloride  flush  3-10 mL Intravenous Q12H   sodium chloride  flush  3-10 mL Intravenous Q12H   sodium chloride  flush  3-10 mL Intravenous Q12H   tamsulosin   0.4 mg Oral PC supper    Infusions:  sodium chloride      acetaminophen  1,000 mg (09/13/23 0555)   albumin  human 25 g (09/13/23 0434)   albumin  human 12.5 g (09/13/23 0512)    ceFAZolin  (ANCEF ) IV 2 g (09/13/23 0556)   dexmedetomidine  (PRECEDEX ) IV infusion Stopped (09/12/23 1446)   DOBUTamine  2.5 mcg/kg/min (09/13/23 0504)   epinephrine  8 mcg/min (09/13/23 0024)   furosemide      insulin  1.6 Units/hr (09/12/23 1900)   niCARDipine      nitroGLYCERIN      norepinephrine  (LEVOPHED ) Adult infusion Stopped (09/12/23  1856)   vasopressin  0.04 Units/min (09/13/23 0121)    PRN Medications: albumin  human, dextrose , HYDROmorphone  (DILAUDID ) injection, metoprolol  tartrate, ondansetron  (ZOFRAN ) IV, mouth rinse, mouth rinse, oxyCODONE , sodium chloride  flush, sodium chloride  flush, sodium chloride  flush, sodium chloride  flush, sodium chloride  flush, traMADol      Assessment/Plan   1. Acute systolic CHF: Symptoms for about 2 wks, no preceeding chest pain or viral syndrome. Echo showed EF 20%, diffuse hypokinesis, mild RV dysfunction, cannot rule out apical thrombus on contrast images.  HS-TnI minimally elevated at admission with no trend.  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  Based on  LHC, this is an ischemic cardiomyopathy.  99% stenosis of the ostial LAD with TIMI 2 flow down the vessel and weak R>L collaterals.  Suspect unrecognized anterior MI about 2 wks ago with development of CHF.  RHC with low (but not markedly low) CI at 2.04, mildly elevated PCWP but normal filling pressures. cMRI suggested LAD territory viable.  - now s/p CABG x 1, LIMA-LAD  - on DBA 5 + Epi 6 + VP 0.03, Co-ox improving now 60% on repeat. FICK CI 1.6, LA 1.2  - start milrinone  0.25 mcg/kg/min, follow swan #s  - bi-v filling pressures elevated, push diuresis today. Trial 120 mg IV Lasix . If poor response will transition to lasix  gtt  - once stable off pressors, will add back oral GDMT as tolerated  - no ? blocker w/ low output   2. CAD: Suspect unrecognized anterior MI 2+ weeks ago.  LHC showed that the ostial LAD is severely diseased and heavily calcified. There is 99% stenosis with TIMI 2 flow down the LAD. There are weak collaterals from RCA to LAD. I reviewed films with Dr. Wonda. There is no good landing zone for stenting of ostial LAD, would require involvement of distal left main with possible jeopardization of the LCx. S/p off pump CABG 7/14 - no ischemic chest pain  - continue ASA + statin  - no ? blocker w/ low output   3. AKI :  ?CKD at  baseline with DM2 and HTN. SCr 1.5 on admission, improved pre-op but back up again, in setting of post cardiotomy shock - SCr 1.10 >>1.82 today  - support CO and MAP w/ inotropes/ pressors. Keep MAP ~70  - off load w/ diuresis today  - follow BMP and UOP  - holding SGLT2i perioperatively. Restart Jardiance  prior to d/c   4.  Apical thrombus: Noted on contrast images and somewhat equivocal. Confirmed by cMRI, layered LV apical mural thrombus  - restart a/c once ok by CT surgery   Advance bowl regimen    CRITICAL CARE Performed by: Caffie Shed   Total critical care time: 15 minutes  Critical care time was exclusive of separately billable procedures and treating other patients.  Critical care was necessary to treat or prevent imminent or life-threatening deterioration.  Critical care was time spent personally by me on the following activities: development of treatment plan with patient and/or surrogate as well as nursing, discussions with consultants, evaluation of patient's response to treatment, examination of patient, obtaining history from patient or surrogate, ordering and performing treatments and interventions, ordering and review of laboratory studies, ordering and review of radiographic studies, pulse oximetry and re-evaluation of patient's condition.   Length of Stay: [address], PA-C  09/13/2023, 7:48 AM  Advanced Heart Failure Team Pager [phone] (M-F; 7a - 5p)  Please contact [facility] for night-coverage after hours (5p -7a ) and weekends on [email or web address]

## 2023-09-13 NOTE — Progress Notes (Signed)
 09/13/2023 Stable off vent, heart failure managing postop shock state. We will be available as needed.  Rolan Sharps MD PCCM

## 2023-09-13 NOTE — Plan of Care (Signed)

## 2023-09-13 NOTE — Progress Notes (Signed)
 [address]       [place] [address]             [phone]                 1 Day Post-Op Procedure(s) (LRB): OFF PUMP CORONARY ARTERY BYPASS GRAFTING X 1, USING LEFT INTERNAL MAMMARY ARTERY (N/A)   Events: No events extubated _______________________________________________________________ Vitals: BP 113/67   Pulse 67   Temp 98.2 F (36.8 C)   Resp 16   Ht 5' 6 (1.676 m)   Wt 68.6 kg   SpO2 95%   BMI 24.41 kg/m  Filed Weights   09/12/23 0432 09/12/23 0644 09/13/23 0500  Weight: 61 kg 60.9 kg 68.6 kg     - Neuro: alert NAD  - Cardiovascular: sinus  Drips: dob 5, epi 6, vaso 0.03 .   PAP: (21-72)/(9-40) 62/25 CVP:  [0 mmHg-43 mmHg] 25 mmHg PCWP:  [20 mmHg-24 mmHg] 20 mmHg CO:  [2.1 L/min-4 L/min] 2.3 L/min CI:  [1.2 L/min/m2-2.4 L/min/m2] 1.4 L/min/m2  - Pulm: EWOB  ABG    Component Value Date/Time   PHART 7.300 (L) 09/12/2023 1606   PCO2ART 40.1 09/12/2023 1606   PO2ART 75 (L) 09/12/2023 1606   HCO3 19.7 (L) 09/12/2023 1606   TCO2 21 (L) 09/12/2023 1606   ACIDBASEDEF 6.0 (H) 09/12/2023 1606   O2SAT 60.4 09/13/2023 0900    - Abd: ND - Extremity: warm  .Intake/Output      07/14 0701 07/15 0700 07/15 0701 07/16 0700   I.V. (mL/kg) 1058.4 (15.4) 605.5 (8.8)   IV Piggyback 1598.8 796.8   Total Intake(mL/kg) 2657.2 (38.7) 1402.4 (20.4)   Urine (mL/kg/hr) 1465 (0.9) 100 (0.4)   Emesis/NG output 40    Blood 200    Chest Tube 375 50   Total Output 2080 150   Net +577.2 +1252.4        Emesis Occurrence 60 x       _______________________________________________________________ Labs:    Latest Ref Rng & Units 09/13/2023    4:05 AM 09/12/2023    4:07 PM 09/12/2023    4:06 PM  CBC  WBC 4.0 - 10.5 K/uL 14.6  15.3    Hemoglobin 13.0 - 17.0 g/dL 84.9  84.9  85.6   Hematocrit 39.0 - 52.0 % 44.5  42.7  42.0   Platelets 150 - 400 K/uL 173  192        Latest Ref Rng & Units 09/13/2023    4:05 AM 09/12/2023    4:07 PM  09/12/2023    4:06 PM  CMP  Glucose 70 - 99 mg/dL 860  896    BUN 8 - 23 mg/dL 20  16    Creatinine 9.38 - 1.24 mg/dL 8.17  8.74    Sodium 864 - 145 mmol/L 134  136  137   Potassium 3.5 - 5.1 mmol/L 4.5  4.3  4.4   Chloride 98 - 111 mmol/L 102  103    CO2 22 - 32 mmol/L 18  20    Calcium 8.9 - 10.3 mg/dL 8.3  8.0      CXR: PV congestion  _______________________________________________________________  Assessment and Plan: POD 1 s/p CABG, CHF  Neuro: pain controlled.   CV: on chemical support, appreciate HF team.  Will keep A line and swan Pulm: IS Renal: diuresing GI: on diet Heme: stable ID: [email or web address] Endo: SSI Dispo: continue ICU care   Joel Pitts 09/13/2023 10:27  AM

## 2023-09-13 NOTE — Progress Notes (Signed)
 AHF team re-engaging CCM with increasing O2 requirements and WOB. Patient currently on milrinone  0.25, dopamine 5, epi 4, vaso 0.03, lasix  30. Has had minimal urine output despite this. On 10L [place] and looks a bit labored.   Bedside US  shows no significant pleural effusions to tap bilaterally. CXR from earlier shows large gas bubble. Difficult to tell if this is stomach or bowel. He's also nauseated. Not passing gas. Will get KUB. Depending on that, management as indicated. Placing on HHFNC to help with some PEEP and increased flows.   Will watch closely. May need reintubation if he does not improve.   Tinnie FORBES Furth, PA-C [facility] 09/13/23 3:45 PM  Please see [email or web address] for pager details.  From 7A-7P if no response, please call [phone] After hours, please call ELink [phone]

## 2023-09-13 NOTE — Progress Notes (Addendum)
 Interval assessment.  Earlier today, Milrinone  0.25 added to regimen for persistently low CI and transitioned to lasix  gtt at 20/hr @ approx 11 AM  CI improving, now at 2.4. Minimal UOP despite lasix  gtt. MAP 69. Increasing O2 demands, now on 10 L [place]. O2 sats 91%.   No increased WOB on exam w/ HOB elevated but orthopneic if laying flat.   I personally checked CVP, > 20. dPAP 22. Remains nauseated, limiting PO intake. CO now better supported w/ Milrinone . Continue current inotropic support.   D/w Dr. Zenaida. Will increase Lasix  gtt to 30/hr and will obtain a f/u BMP. If continued poor response, will need to add metolazone  to augment diuresis.   Will follow closely. Will have MD perform bedside echo given near equalization of RA and dPAP.    Joel Shed, PA-C

## 2023-09-13 NOTE — Progress Notes (Cosign Needed Addendum)
 Swan #s checked personally by Dr. Cherrie   On Milrinone  0.25 + DBA 5 + Epi 4, VP 0.03  CVP 21 PAP 72/24 CO 4.51 CI 2.7  SVR 957   Bedside echo EF ~20%, RV enlarged, IVS flattened in systole, no effusion   Remains oliguric despite high dose lasix  gtt. Suspect post-op ATN  Continue Lasix  gtt @ 30/hr. Add Metolazone  5 mg x 1 and follow response  Start iNO for lowering of PA pressures and RV support   If poor response, will plan placement of HD cath and start of CRRT. D/w CCM at bedside.   Caffie Shed, PA-C

## 2023-09-13 NOTE — Progress Notes (Signed)
 Patient started on nitric 20ppm through 10L [place].

## 2023-09-13 NOTE — Progress Notes (Signed)
   Patient with progressive dyspnea throughout afternoon.   Remains on multiple pressors.   Has been anuric despite lasix  gtt at 30/hr K 5.2 Scr 1.2 -> 1.8 -> 2.3 Bicarb 16  Seen at bedside with Dr. Claudene  POCUS ECHO with EF 25% RV markedly dilated an severe HK. Septum flattened.   Norva numbers done personally  CVP 22 PAP 72/24 PCWP 21 CO 4.51 CI 2.7  SVR 957  PVR ~ 7   Cardiac output has improved with inotrope titration but now with dense ATN and significant PAH and RV failure   Will start iNO and give a dose of metolazone . If no urine output will place HD cath and begin CVVHD.   CCT 45 mins  Toribio Fuel, MD  4:58 PM

## 2023-09-14 ENCOUNTER — Inpatient Hospital Stay (HOSPITAL_COMMUNITY)

## 2023-09-14 DIAGNOSIS — E1165 Type 2 diabetes mellitus with hyperglycemia: Secondary | ICD-10-CM | POA: Diagnosis not present

## 2023-09-14 DIAGNOSIS — I5021 Acute systolic (congestive) heart failure: Secondary | ICD-10-CM | POA: Diagnosis not present

## 2023-09-14 DIAGNOSIS — I10 Essential (primary) hypertension: Secondary | ICD-10-CM | POA: Diagnosis not present

## 2023-09-14 DIAGNOSIS — J90 Pleural effusion, not elsewhere classified: Secondary | ICD-10-CM | POA: Diagnosis not present

## 2023-09-14 DIAGNOSIS — I5023 Acute on chronic systolic (congestive) heart failure: Secondary | ICD-10-CM | POA: Diagnosis not present

## 2023-09-14 LAB — COMPREHENSIVE METABOLIC PANEL WITH GFR
ALT: 7 U/L (ref 0–44)
AST: 29 U/L (ref 15–41)
Albumin: 4.2 g/dL (ref 3.5–5.0)
Alkaline Phosphatase: 45 U/L (ref 38–126)
Anion gap: 18 — ABNORMAL HIGH (ref 5–15)
BUN: 33 mg/dL — ABNORMAL HIGH (ref 8–23)
CO2: 20 mmol/L — ABNORMAL LOW (ref 22–32)
Calcium: 8.6 mg/dL — ABNORMAL LOW (ref 8.9–10.3)
Chloride: 94 mmol/L — ABNORMAL LOW (ref 98–111)
Creatinine, Ser: 2.95 mg/dL — ABNORMAL HIGH (ref 0.61–1.24)
GFR, Estimated: 22 mL/min — ABNORMAL LOW (ref >60)
Glucose, Bld: 135 mg/dL — ABNORMAL HIGH (ref 70–99)
Potassium: 4 mmol/L (ref 3.5–5.1)
Sodium: 132 mmol/L — ABNORMAL LOW (ref 135–145)
Total Bilirubin: 1.6 mg/dL — ABNORMAL HIGH (ref 0.0–1.2)
Total Protein: 6.7 g/dL (ref 6.5–8.1)

## 2023-09-14 LAB — CBC
HCT: 41.3 % (ref 39.0–52.0)
Hemoglobin: 13.9 g/dL (ref 13.0–17.0)
MCH: 32.4 pg (ref 26.0–34.0)
MCHC: 33.7 g/dL (ref 30.0–36.0)
MCV: 96.3 fL (ref 80.0–100.0)
Platelets: 112 K/uL — ABNORMAL LOW (ref 150–400)
RBC: 4.29 MIL/uL (ref 4.22–5.81)
RDW: 12.9 % (ref 11.5–15.5)
WBC: 14.8 K/uL — ABNORMAL HIGH (ref 4.0–10.5)
nRBC: 0 % (ref 0.0–0.2)

## 2023-09-14 LAB — COOXEMETRY PANEL
Carboxyhemoglobin: 1.4 % (ref 0.5–1.5)
Methemoglobin: 0.7 % (ref 0.0–1.5)
O2 Saturation: 60.9 %
Total hemoglobin: 14.5 g/dL (ref 12.0–16.0)

## 2023-09-14 LAB — BASIC METABOLIC PANEL WITH GFR
Anion gap: 16 — ABNORMAL HIGH (ref 5–15)
BUN: 35 mg/dL — ABNORMAL HIGH (ref 8–23)
CO2: 19 mmol/L — ABNORMAL LOW (ref 22–32)
Calcium: 8.6 mg/dL — ABNORMAL LOW (ref 8.9–10.3)
Chloride: 96 mmol/L — ABNORMAL LOW (ref 98–111)
Creatinine, Ser: 2.91 mg/dL — ABNORMAL HIGH (ref 0.61–1.24)
GFR, Estimated: 22 mL/min — ABNORMAL LOW (ref >60)
Glucose, Bld: 113 mg/dL — ABNORMAL HIGH (ref 70–99)
Potassium: 3.9 mmol/L (ref 3.5–5.1)
Sodium: 131 mmol/L — ABNORMAL LOW (ref 135–145)

## 2023-09-14 LAB — GLUCOSE, CAPILLARY
Glucose-Capillary: 124 mg/dL — ABNORMAL HIGH (ref 70–99)
Glucose-Capillary: 128 mg/dL — ABNORMAL HIGH (ref 70–99)
Glucose-Capillary: 132 mg/dL — ABNORMAL HIGH (ref 70–99)
Glucose-Capillary: 148 mg/dL — ABNORMAL HIGH (ref 70–99)
Glucose-Capillary: 157 mg/dL — ABNORMAL HIGH (ref 70–99)
Glucose-Capillary: 99 mg/dL (ref 70–99)

## 2023-09-14 LAB — HEPARIN LEVEL (UNFRACTIONATED): Heparin Unfractionated: <0.1 [IU]/mL — ABNORMAL LOW (ref 0.30–0.70)

## 2023-09-14 MED ORDER — DOBUTAMINE-DEXTROSE 4-5 MG/ML-% IV SOLN
2.5000 ug/kg/min | INTRAVENOUS | Status: DC
  Filled 2023-09-14: qty 250

## 2023-09-14 MED ORDER — HEPARIN (PORCINE) 25000 UT/250ML-% IV SOLN
1500.0000 [IU]/h | INTRAVENOUS | Status: DC
  Administered 2023-09-14: 500 [IU]/h via INTRAVENOUS
  Administered 2023-09-16: 1250 [IU]/h via INTRAVENOUS
  Administered 2023-09-16 – 2023-09-17 (×2): 1500 [IU]/h via INTRAVENOUS
  Filled 2023-09-14 (×4): qty 250

## 2023-09-14 MED ORDER — METOLAZONE 5 MG PO TABS
5.0000 mg | ORAL_TABLET | Freq: Once | ORAL | Status: AC
  Administered 2023-09-14: 5 mg via ORAL
  Filled 2023-09-14: qty 1

## 2023-09-14 MED ORDER — DOBUTAMINE-DEXTROSE 4-5 MG/ML-% IV SOLN
2.5000 ug/kg/min | INTRAVENOUS | Status: DC
  Administered 2023-09-15: 2.5 ug/kg/min via INTRAVENOUS

## 2023-09-14 NOTE — Plan of Care (Signed)
  Problem: Education: Goal: Knowledge of General Education information will improve Description: Including pain rating scale, medication(s)/side effects and non-pharmacologic comfort measures Outcome: Progressing   Problem: Health Behavior/Discharge Planning: Goal: Ability to manage health-related needs will improve Outcome: Progressing   Problem: Clinical Measurements: Goal: Ability to maintain clinical measurements within normal limits will improve Outcome: Progressing Goal: Will remain free from infection Outcome: Progressing   Problem: Coping: Goal: Level of anxiety will decrease Outcome: Progressing   Problem: Elimination: Goal: Will not experience complications related to urinary retention Outcome: Progressing   Problem: Pain Managment: Goal: General experience of comfort will improve and/or be controlled Outcome: Progressing

## 2023-09-14 NOTE — Progress Notes (Signed)
 NAME:  Joel Pitts, MRN:  [id number], DOB:  1950/09/16, LOS: 8 ADMISSION DATE:  09/06/2023, CONSULTATION DATE:  09/12/23 REFERRING MD:  Dr. Shyrl, CHIEF COMPLAINT:  post op   History of Present Illness:   77 yoM with PMH of HTN, DM, BPH, and nephrolithiasis admitted on 7/8 for further workup of several week hx of SOB, fatigue, and chest tightness.  On workup, found to have EKG with age undetermined ST anteroseptal infarct, BNP 2.2k, Trop hs 29> 25, sCr 1.53, CTA neg for PE, bilateral pleural effusions seen, TTE showed EF < 20%, possible layered apical thrombus, LV GHK, RV mildly reduced, trivial MR.  Underwent diureses and heparin  gtt. Underwent 7/11 R/LHC which revealed 99% stenosis of distal LM to proximal LAD, cardiac MRI 7/11 showed significant LM and viable LAD territory except apex.  AHF and TCTS consulted.  Underwent CABG x 1, LIMA- LAD using left internal mammaary artery off CPB.  PCCM consulted for further vent and medical management while post-op in ICU.   Pertinent  Medical History   Past Medical History:  Diagnosis Date   Diabetes mellitus without complication (HCC)    History of kidney stones    ETOH- 1 beer/ night  Significant Hospital Events: Including procedures, antibiotic start and stop dates in addition to other pertinent events   7/14: CABG x1 off pump 7/15: signed off and then re-engaged for shortness of breath. Bedside POCUS with no significant effusion to tap. Placed on HHFNC to help with PEEP. Evening placed on NO for increased PA pressures.   Interim History / Subjective:  NAEON. He feels similar to yesterday, generally unwell. Still feels SOB, unchanged from yesterday. UOP starting to pick up this morning.   Objective    Blood pressure (!) 114/53, pulse 79, temperature (!) 97.3 F (36.3 C), resp. rate 14, height 5' 6 (1.676 m), weight 68.6 kg, SpO2 91%. PAP: (51-70)/(15-29) 60/20 CVP:  [8 mmHg-28 mmHg] 12 mmHg PCWP:  [21 mmHg] 21 mmHg CO:  [2.1  L/min-4.5 L/min] 4.1 L/min CI:  [1.2 L/min/m2-2.7 L/min/m2] 2.42 L/min/m2  FiO2 (%):  [70 %] 70 %   Intake/Output Summary (Last 24 hours) at 09/14/2023 0839 Last data filed at 09/14/2023 0700 Gross per 24 hour  Intake 1337.89 ml  Output 2130 ml  Net -792.11 ml   Filed Weights   09/12/23 0432 09/12/23 0644 09/13/23 0500  Weight: 61 kg 60.9 kg 68.6 kg    Examination: Ill appearing Looks tired, winded but not in extremis  Trace R and no left effusion, bilateral weak but moving diaphragms, R>L atelectasis on US  Bedside echo with some signs of worsening RV pressures congruent with swan numbers Aox3, weak, sternotomy looks okay  Repeat CXR 7/16 AM, more dense atelectasis   Resolved problem list  AKI vs CKD Hyponatremia  Assessment and Plan   CAD s/p CABG x1, LIMA-LAD Acute systolic HF with volume overload, EF < 20% Apical LV thrombus  Postop cardioplegia R>L with ATN vs. Cardiorenal stubborn to diuresis Postop elevated PAPs HTN HLD DMT2 with hyperglycemia  BPH  P:  - Levo/vaso MAP 65 - Inotropes per AHF - step down on dobutamine  to 2.5. CVPs now 13 but PA pressures still up 60s/20s. ?MCS  - UOP is increasing. Repeat BMP @ 1300 and make decision about CRRT  - continue nasal cannula with Nitric, can increase to HHFNC if he needs more flow  - new a-line - Airway watch - Will follow  CC time: 35 minutes  Tinnie FORBES Furth, PA-C [facility] 09/14/23 9:38 AM  Please see [email or web address] for pager details.  From 7A-7P if no response, please call [phone] After hours, please call ELink [phone]

## 2023-09-14 NOTE — Procedures (Signed)
 Arterial Catheter Insertion Procedure Note  JACIEL DIEM  [id number]  07/03/1950  Date:09/14/23  Time:12:34 PM    Provider Performing: Tinnie FORBES Furth    Procedure: Insertion of Arterial Line (63379) with US  guidance (23062)   Indication(s) Blood pressure monitoring and/or need for frequent ABGs  Consent Risks of the procedure as well as the alternatives and risks of each were explained to the patient and/or caregiver.  Consent for the procedure was obtained and is signed in the bedside chart  Anesthesia None   Time Out Verified patient identification, verified procedure, site/side was marked, verified correct patient position, special equipment/implants available, medications/allergies/relevant history reviewed, required imaging and test results available.   Sterile Technique Maximal sterile technique including full sterile barrier drape, hand hygiene, sterile gown, sterile gloves, mask, hair covering, sterile ultrasound probe cover (if used).   Procedure Description Area of catheter insertion was cleaned with chlorhexidine  and draped in sterile fashion. With real-time ultrasound guidance an arterial catheter was placed into the right radial artery.  Appropriate arterial tracings confirmed on monitor.     Complications/Tolerance None; patient tolerated the procedure well.   EBL Minimal   Specimen(s) None   Tinnie FORBES Furth, PA-C [facility] 09/14/23 12:34 PM  Please see [email or web address] for pager details.  From 7A-7P if no response, please call [phone] After hours, please call ELink [phone]

## 2023-09-14 NOTE — Progress Notes (Signed)
 PHARMACY - ANTICOAGULATION CONSULT NOTE  Pharmacy Consult for heparin  Indication: Echo showing apical clot  Allergies  Allergen Reactions   Statins Diarrhea    Patient Measurements: Height: 5' 6 (167.6 cm) Weight: 68.6 kg (151 lb 3.8 oz) IBW/kg (Calculated) : 63.8 HEPARIN  DW (KG): 60.9  Vital Signs: Temp: 98.6 F (37 C) (07/16 1615) Temp Source: Core (07/16 1200) BP: 126/61 (07/16 1445) Pulse Rate: 96 (07/16 1615)  Labs: Recent Labs    09/12/23 0625 09/12/23 0825 09/12/23 1048 09/12/23 1205 09/13/23 0405 09/13/23 1446 09/13/23 1757 09/14/23 0759 09/14/23 1215 09/14/23 1722  HGB 17.7*   < > 15.0   < > 15.0 13.9 14.3 13.9  --   --   HCT 51.0   < > 43.6   < > 44.5 41.8 42.0 41.3  --   --   PLT 167  --  127*   < > 173 106*  --  112*  --   --   APTT  --   --  33  --   --   --   --   --   --   --   LABPROT  --   --  16.0*  --   --   --   --   --   --   --   INR  --   --  1.2  --   --   --   --   --   --   --   HEPARINUNFRC 0.36  --   --   --   --   --   --   --   --  <0.10*  CREATININE 1.30*   < >  --    < > 1.82* 2.29* 2.10* 2.95* 2.91*  --    < > = values in this interval not displayed.    Estimated Creatinine Clearance: 20.1 mL/min (A) (by C-G formula based on SCr of 2.91 mg/dL (H)).   Assessment: 73 yo male with HF and apical clot on ECHO. Pharmacy consulted to dose IV heparin .   Heparin  was therapeutic this morning at 0.48, on 1050 units/hr. Underwent cardiac cath finding severely disease LAD with weak collaterals - reviewed with interventional and now being evaluated by CTVS. Patient had off-pump CABG procedure on 09/12/23 complicated by post-operative shock state.   7/16 AM: Chest tubes remain in place. Discussed with HF and CTVS MD, will start low-fixed dose heparin  until chest tubes are removed. CBC remains stable (Hgb 13.9, PLT 112).   7/17 PM: heparin  level < 0.10, no bleeding or issues with line.   Goal of Therapy:  Heparin  level <0.3 units/mL while  chest tubes are in place Monitor platelets by anticoagulation protocol: Yes   Plan:  Continue heparin  at 500 units/hr - per day team keep fixed dose while chest tubes remain in place Next heparin  level with AM labs Daily heparin  level, CBC Monitor s/sx bleeding  Thank you for allowing pharmacy to participate in this patient's care,  Randall Call, PharmD, BCPS 09/14/23 6:50 PM Please check AMION for all [facility] Pharmacy phone numbers After 10:00 PM, call Main Pharmacy [phone]

## 2023-09-14 NOTE — Progress Notes (Addendum)
 PHARMACY - ANTICOAGULATION CONSULT NOTE  Pharmacy Consult for heparin  Indication: Echo showing apical clot  Allergies  Allergen Reactions   Statins Diarrhea    Patient Measurements: Height: 5' 6 (167.6 cm) Weight: 68.6 kg (151 lb 3.8 oz) IBW/kg (Calculated) : 63.8 HEPARIN  DW (KG): 60.9  Vital Signs: Temp: 97.3 F (36.3 C) (07/16 0855) BP: 114/53 (07/16 0415) Pulse Rate: 85 (07/16 0855)  Labs: Recent Labs    09/12/23 0625 09/12/23 0825 09/12/23 1048 09/12/23 1205 09/13/23 0405 09/13/23 1446 09/13/23 1757 09/14/23 0759  HGB 17.7*   < > 15.0   < > 15.0 13.9 14.3 13.9  HCT 51.0   < > 43.6   < > 44.5 41.8 42.0 41.3  PLT 167  --  127*   < > 173 106*  --  112*  APTT  --   --  33  --   --   --   --   --   LABPROT  --   --  16.0*  --   --   --   --   --   INR  --   --  1.2  --   --   --   --   --   HEPARINUNFRC 0.36  --   --   --   --   --   --   --   CREATININE 1.30*   < >  --    < > 1.82* 2.29* 2.10* 2.95*   < > = values in this interval not displayed.    Estimated Creatinine Clearance: 19.8 mL/min (A) (by C-G formula based on SCr of 2.95 mg/dL (H)).   Assessment: 73 yo male with HF and apical clot on ECHO. Pharmacy consulted to dose IV heparin .   Heparin  was therapeutic this morning at 0.48, on 1050 units/hr. Underwent cardiac cath finding severely disease LAD with weak collaterals - reviewed with interventional and now being evaluated by CTVS. Patient had off-pump CABG procedure on 09/12/23 complicated by post-operative shock state.   7/16 AM: Chest tubes remain in place. Discussed with HF and CTVS MD, will start low-fixed dose heparin  until chest tubes are removed. CBC remains stable (Hgb 13.9, PLT 112).   Goal of Therapy:  Heparin  level <0.3 units/mL while chest tubes are in place Monitor platelets by anticoagulation protocol: Yes   Plan:  Restart heparin  infusion at 500 units/hr - keep fixed dose while chest tubes remain in place Check heparin  level in 8  hours Daily heparin  level, CBC Monitor s/sx bleeding  Thank you for allowing pharmacy to participate in this patient's care,  Morna Breach, PharmD PGY2 Cardiology Pharmacy Resident 09/14/2023 9:51 AM  Please check AMION for all [facility] Pharmacy phone numbers After 10:00 PM, call Main Pharmacy [phone]

## 2023-09-14 NOTE — Progress Notes (Signed)
 Advanced Heart Failure Rounding Note  Cardiologist: None  Chief Complaint: acute systolic heart failure, post cardiotomy shock   Patient Profile   73 y/o male admitted w/ chest pain and new systolic heart failure and LV thrombus. EF 20%, RV mildly reduced. Cath w/ 99% stenosis of LAD, now s/p CABG x 1. Post op course c/b post cardiotomy shock and ATN.    Subjective:     7/14: S/p off pump CABG x 1 LIMA-LAD. Extubated same day  7/15: worsening dyspnea w/ anuric AKI, required escalation of inotrope's. POCUS ECHO with EF 25% RV markedly dilated an severe HK. Septum flattened. Started on iNO and high dose lasix  gtt  POD#3   Remains on DBA 5 + Milrinone  0.25 + Epi 4 + VP 0.04. iNO @20 .   UOP picked up overnight. 1.2L on Lasix  gtt at 30/hr + 5 mg of metolazone .  SCr 2.10>>2.95, K 4.0  Swan #s improving  CVP 13 PAP 57/18 PCW 20  CI 2.8  Co-ox 61%  PAPi 3   Still feels poorly but slight improvement since yesterday. C/w dyspnea and nausea. Able to tolerate a bit more breakfast today. No vomiting.    Objective:   Weight Range: 68.6 kg Body mass index is 24.41 kg/m.   Vital Signs:   Temp:  [87.1 F (30.6 C)-98.6 F (37 C)] 97.3 F (36.3 C) (07/16 0453) Pulse Rate:  [35-90] 79 (07/16 0453) Resp:  [14-32] 14 (07/16 0453) BP: (105-131)/(51-72) 114/53 (07/16 0415) SpO2:  [88 %-97 %] 91 % (07/16 0453) Arterial Line BP: (94-161)/(36-97) 124/51 (07/16 0453) FiO2 (%):  [70 %] 70 % (07/16 0130) Last BM Date : 09/10/23  Weight change: Filed Weights   09/12/23 0432 09/12/23 0644 09/13/23 0500  Weight: 61 kg 60.9 kg 68.6 kg    Intake/Output:   Intake/Output Summary (Last 24 hours) at 09/14/2023 0718 Last data filed at 09/14/2023 0700 Gross per 24 hour  Intake 2673.4 ml  Output 2130 ml  Net 543.4 ml      Physical Exam   General:  weak/fatigued appearing. No respiratory difficulty HEENT: normal Neck: supple. JVD 13 cm +RIJ swan.  Cor: Regular rate & rhythm. + RSB  rub. + sternal dressing, + CTs  Lungs: decreased BS at the bases w/ faint bibasilar crackles  Abdomen: soft, nontender, nondistended.  Extremities: no cyanosis, clubbing, rash, 1+ b/l LE edema Neuro: alert & oriented x 3, cranial nerves grossly intact. moves all 4 extremities w/o difficulty. Affect pleasant. GU: + foley    Telemetry   NSR 80s, personally reviewed   EKG   N/A   Labs    CBC Recent Labs    09/12/23 1607 09/13/23 0405 09/13/23 1446 09/13/23 1757  WBC 15.3* 14.6* 13.5*  --   NEUTROABS 11.3*  --   --   --   HGB 15.0 15.0 13.9 14.3  HCT 42.7 44.5 41.8 42.0  MCV 93.4 96.9 98.1  --   PLT 192 173 106*  --    Basic Metabolic Panel Recent Labs    92/84/74 0405 09/13/23 1446 09/13/23 1757  NA 134* 132* 134*  K 4.5 5.2* 4.5  CL 102 98 104  CO2 18* 16*  --   GLUCOSE 139* 152* 191*  BUN 20 25* 25*  CREATININE 1.82* 2.29* 2.10*  CALCIUM 8.3* 8.2*  --   MG 3.0* 2.7*  --    Liver Function Tests No results for input(s): AST, ALT, ALKPHOS, BILITOT, PROT, ALBUMIN  in the last  72 hours. No results for input(s): LIPASE, AMYLASE in the last 72 hours. Cardiac Enzymes No results for input(s): CKTOTAL, CKMB, CKMBINDEX, TROPONINI in the last 72 hours.  BNP: BNP (last 3 results) Recent Labs    09/06/23 0856  BNP 2,251.1*    ProBNP (last 3 results) No results for input(s): PROBNP in the last 8760 hours.   D-Dimer No results for input(s): DDIMER in the last 72 hours. Hemoglobin A1C No results for input(s): HGBA1C in the last 72 hours. Fasting Lipid Panel No results for input(s): CHOL, HDL, LDLCALC, TRIG, CHOLHDL, LDLDIRECT in the last 72 hours. Thyroid Function Tests No results for input(s): TSH, T4TOTAL, T3FREE, THYROIDAB in the last 72 hours.  Invalid input(s): FREET3  Other results:   Imaging    DG Abd 1 View Result Date: 09/13/2023 CLINICAL DATA:  Ileus EXAM: ABDOMEN - 1 VIEW COMPARISON:   Abdominal radiograph dated 12/09/2014 FINDINGS: Partially imaged inferior mediastinum wires are nondisplaced. Left upper quadrant surgical clips. Improved approach catheter projects over the lower midline pelvis. Nonobstructive bowel gas pattern. Paucity of bowel gas in the left lower quadrant. IMPRESSION: Nonobstructive bowel gas pattern. Paucity of bowel gas in the left lower quadrant may reflect underdistended/compressed or fluid-filled bowel loops. Electronically Signed   By: Limin  Xu M.D.   On: 09/13/2023 15:48   DG Chest Port 1 View Result Date: 09/13/2023 CLINICAL DATA:  [id number] SOB (shortness of breath) [id number] EXAM: PORTABLE CHEST - 1 VIEW COMPARISON:  Earlier film of the same day FINDINGS: Stable right IJ Swan-Ganz and left chest tube. No pneumothorax. Low lung volumes with patchy perihilar and infrahilar opacities. Small right pleural effusion with atelectasis/consolidation at the right lung base as before. Aortic Atherosclerosis (ICD10-170.0). Sternotomy wires. IMPRESSION: 1. No pneumothorax. 2. Low lung volumes with patchy perihilar and infrahilar opacities. Electronically Signed   By: JONETTA Faes M.D.   On: 09/13/2023 14:33     Medications:     Scheduled Medications:  acetaminophen   1,000 mg Oral Q6H   Or   acetaminophen  (TYLENOL ) oral liquid 160 mg/5 mL  1,000 mg Per Tube Q6H   aspirin  EC  325 mg Oral Daily   Or   aspirin   324 mg Per Tube Daily   bisacodyl   10 mg Oral Daily   Or   bisacodyl   10 mg Rectal Daily   Chlorhexidine  Gluconate Cloth  6 each Topical Daily   docusate sodium   200 mg Oral Daily   feeding supplement (NEPRO CARB STEADY)  237 mL Oral Daily   insulin  aspart  0-24 Units Subcutaneous Q4H   insulin  glargine-yfgn  5 Units Subcutaneous BID   pantoprazole   40 mg Oral Daily   sodium chloride  flush  3 mL Intravenous Q12H   sodium chloride  flush  3-10 mL Intravenous Q12H   sodium chloride  flush  3-10 mL Intravenous Q12H   sodium chloride  flush  3-10 mL Intravenous  Q12H   sodium chloride  flush  3-10 mL Intravenous Q12H   tamsulosin   0.4 mg Oral PC supper    Infusions:  albumin  human Stopped (09/13/23 0527)   dexmedetomidine  (PRECEDEX ) IV infusion Stopped (09/12/23 1446)   DOBUTamine  5 mcg/kg/min (09/14/23 0605)   epinephrine  4 mcg/min (09/14/23 0605)   furosemide  (LASIX ) 200 mg in dextrose  5 % 100 mL (2 mg/mL) infusion 30 mg/hr (09/14/23 0605)   milrinone  0.25 mcg/kg/min (09/14/23 0605)   norepinephrine  (LEVOPHED ) Adult infusion Stopped (09/13/23 1113)   vasopressin  0.03 Units/min (09/14/23 0605)    PRN Medications: albumin  human,  dextrose , HYDROmorphone  (DILAUDID ) injection, ondansetron  (ZOFRAN ) IV, mouth rinse, mouth rinse, oxyCODONE , sodium chloride  flush, sodium chloride  flush, sodium chloride  flush, sodium chloride  flush, sodium chloride  flush, traMADol      Assessment/Plan   1. Acute systolic CHF w/ Post Cardiology Shock: Symptoms for about 2 wks, no preceeding chest pain or viral syndrome. Echo showed EF 20%, diffuse hypokinesis, mild RV dysfunction, cannot rule out apical thrombus on contrast images.  HS-TnI minimally elevated at admission with no trend.  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  Based on LHC, this is an ischemic cardiomyopathy.  99% stenosis of the ostial LAD with TIMI 2 flow down the vessel and weak R>L collaterals.  Suspect unrecognized anterior MI about 2 wks ago with development of CHF.  RHC with low (but not markedly low) CI at 2.04, mildly elevated PCWP but normal filling pressures. cMRI suggested LAD territory viable.  - now s/p CABG x 1, LIMA-LAD c/b post-cardiotomy shock and ATN   - POCUS ECHO 7/15 with EF 25% RV markedly dilated an severe HK. Septum flattened - on DBA 5 + Milrinone  0.375,  Epi 4 + VP 0.03, CI improving 2.8 today, remains volume overloaded but UOP picking up  - continue Lasix  gtt at 30/hr + repeat metolazone  5 mg x 1. If no continued improvement, will plan to switch to CRRT for off loading  - continue milrinone  0.25,  Epi for RV support and iNO for RV support. Wean down DBA to 2.5. Continue VP for MAP support, wean as tolerated   2. CAD: Suspect unrecognized anterior MI 2+ weeks ago.  LHC showed that the ostial LAD is severely diseased and heavily calcified. There is 99% stenosis with TIMI 2 flow down the LAD. There are weak collaterals from RCA to LAD. I reviewed films with Dr. Wonda. There is no good landing zone for stenting of ostial LAD, would require involvement of distal left main with possible jeopardization of the LCx. S/p off pump CABG 7/14 - no ischemic chest pain  - continue ASA + statin  - no ? blocker w/ low output   3. AKI : ?CKD at baseline with DM2 and HTN. SCr 1.5 on admission, improved pre-op but developed post-op AKI. Likely ATN. SCr now up to 2.9 but starting to diurese w/ IV Lasix   - continue Lasix  gtt + metolazone  per above. If continues to struggle will need CRRT - continue to support CO and MAP per above  - follow UOP and BMP closely   4.  Apical thrombus: Noted on contrast images and somewhat equivocal. Confirmed by cMRI, layered LV apical mural thrombus  - d/w CT surgery today, can start low dose heparin    5. Acute Hypoxic Respiratory Failure - on HHF [place] - push diuresis per above - continue iNO  - RT and CCM following    CRITICAL CARE Performed by: Caffie Shed   Total critical care time: 15 minutes  Critical care time was exclusive of separately billable procedures and treating other patients.  Critical care was necessary to treat or prevent imminent or life-threatening deterioration.  Critical care was time spent personally by me on the following activities: development of treatment plan with patient and/or surrogate as well as nursing, discussions with consultants, evaluation of patient's response to treatment, examination of patient, obtaining history from patient or surrogate, ordering and performing treatments and interventions, ordering and review of  laboratory studies, ordering and review of radiographic studies, pulse oximetry and re-evaluation of patient's condition.    Length of Stay: 8  Caffie Shed, PA-C  09/14/2023, 7:18 AM  Advanced Heart Failure Team Pager [phone] (M-F; 7a - 5p)  Please contact [facility] for night-coverage after hours (5p -7a ) and weekends on [email or web address]

## 2023-09-14 NOTE — Progress Notes (Signed)
 [address]       [place] [id number]             [phone]                 2 Days Post-Op Procedure(s) (LRB): OFF PUMP CORONARY ARTERY BYPASS GRAFTING X 1, USING LEFT INTERNAL MAMMARY ARTERY (N/A)   Events: No events On iNO started overnight _______________________________________________________________ Vitals: BP (!) 117/58   Pulse 85   Temp 97.9 F (36.6 C)   Resp (!) 28   Ht 5' 6 (1.676 m)   Wt 68.6 kg   SpO2 94%   BMI 24.41 kg/m  Filed Weights   09/12/23 0432 09/12/23 0644 09/13/23 0500  Weight: 61 kg 60.9 kg 68.6 kg     - Neuro: alert NAD  - Cardiovascular: sinus  Drips: dob 5, epi 6, vaso 0.03 milr 0.25  PAP: (53-70)/(14-27) 63/21 CVP:  [8 mmHg-23 mmHg] 16 mmHg PCWP:  [21 mmHg] 21 mmHg CO:  [3.6 L/min-4.8 L/min] 3.7 L/min CI:  [2.1 L/min/m2-2.81 L/min/m2] 2.2 L/min/m2  - Pulm: EWOB  ABG    Component Value Date/Time   PHART 7.300 (L) 09/12/2023 1606   PCO2ART 40.1 09/12/2023 1606   PO2ART 75 (L) 09/12/2023 1606   HCO3 19.7 (L) 09/12/2023 1606   TCO2 15 (L) 09/13/2023 1757   ACIDBASEDEF 6.0 (H) 09/12/2023 1606   O2SAT 60.9 09/14/2023 0755    - Abd: ND - Extremity: warm  .Intake/Output      07/15 0701 07/16 0700 07/16 0701 07/17 0700   I.V. (mL/kg) 1498 (21.8) 363.6 (5.3)   IV Piggyback 1175.4 100   Total Intake(mL/kg) 2673.4 (39) 463.6 (6.8)   Urine (mL/kg/hr) 1880 (1.1) 970 (1.8)   Emesis/NG output     Blood     Chest Tube 250    Total Output 2130 970   Net +543.4 -506.4           _______________________________________________________________ Labs:    Latest Ref Rng & Units 09/14/2023    7:59 AM 09/13/2023    5:57 PM 09/13/2023    2:46 PM  CBC  WBC 4.0 - 10.5 K/uL 14.8   13.5   Hemoglobin 13.0 - 17.0 g/dL 86.0  85.6  86.0   Hematocrit 39.0 - 52.0 % 41.3  42.0  41.8   Platelets 150 - 400 K/uL 112   106       Latest Ref Rng & Units 09/14/2023   12:15 PM 09/14/2023    7:59 AM 09/13/2023    5:57 PM   CMP  Glucose 70 - 99 mg/dL 886  864  808   BUN 8 - 23 mg/dL 35  33  25   Creatinine 0.61 - 1.24 mg/dL 7.08  7.04  7.89   Sodium 135 - 145 mmol/L 131  132  134   Potassium 3.5 - 5.1 mmol/L 3.9  4.0  4.5   Chloride 98 - 111 mmol/L 96  94  104   CO2 22 - 32 mmol/L 19  20    Calcium 8.9 - 10.3 mg/dL 8.6  8.6    Total Protein 6.5 - 8.1 g/dL  6.7    Total Bilirubin 0.0 - 1.2 mg/dL  1.6    Alkaline Phos 38 - 126 U/L  45    AST 15 - 41 U/L  29    ALT 0 - 44 U/L  7      CXR: PV congestion  _______________________________________________________________  Assessment and Plan: POD 2 s/p CABG, CHF  Neuro: pain controlled.   CV: on chemical support, appreciate HF team.  Increased support.  Started on lasix  gtt.  Will need new A-line if started on CRRT Pulm: IS Renal: considering lasix  gtt GI: on diet Heme: stable ID: [email or web address] Endo: SSI Dispo: continue ICU care   Joel Pitts 09/14/2023 2:46 PM

## 2023-09-15 DIAGNOSIS — I5021 Acute systolic (congestive) heart failure: Secondary | ICD-10-CM | POA: Diagnosis not present

## 2023-09-15 DIAGNOSIS — E1165 Type 2 diabetes mellitus with hyperglycemia: Secondary | ICD-10-CM | POA: Diagnosis not present

## 2023-09-15 DIAGNOSIS — E785 Hyperlipidemia, unspecified: Secondary | ICD-10-CM | POA: Diagnosis not present

## 2023-09-15 DIAGNOSIS — I5023 Acute on chronic systolic (congestive) heart failure: Secondary | ICD-10-CM | POA: Diagnosis not present

## 2023-09-15 DIAGNOSIS — I10 Essential (primary) hypertension: Secondary | ICD-10-CM | POA: Diagnosis not present

## 2023-09-15 LAB — BASIC METABOLIC PANEL WITH GFR
Anion gap: 15 (ref 5–15)
Anion gap: 16 — ABNORMAL HIGH (ref 5–15)
Anion gap: 17 — ABNORMAL HIGH (ref 5–15)
BUN: 43 mg/dL — ABNORMAL HIGH (ref 8–23)
BUN: 43 mg/dL — ABNORMAL HIGH (ref 8–23)
BUN: 45 mg/dL — ABNORMAL HIGH (ref 8–23)
CO2: 20 mmol/L — ABNORMAL LOW (ref 22–32)
CO2: 21 mmol/L — ABNORMAL LOW (ref 22–32)
CO2: 26 mmol/L (ref 22–32)
Calcium: 8.7 mg/dL — ABNORMAL LOW (ref 8.9–10.3)
Calcium: 9 mg/dL (ref 8.9–10.3)
Calcium: 9.3 mg/dL (ref 8.9–10.3)
Chloride: 89 mmol/L — ABNORMAL LOW (ref 98–111)
Chloride: 91 mmol/L — ABNORMAL LOW (ref 98–111)
Chloride: 92 mmol/L — ABNORMAL LOW (ref 98–111)
Creatinine, Ser: 2.65 mg/dL — ABNORMAL HIGH (ref 0.61–1.24)
Creatinine, Ser: 3.02 mg/dL — ABNORMAL HIGH (ref 0.61–1.24)
Creatinine, Ser: 3.08 mg/dL — ABNORMAL HIGH (ref 0.61–1.24)
GFR, Estimated: 20 mL/min — ABNORMAL LOW (ref >60)
GFR, Estimated: 21 mL/min — ABNORMAL LOW (ref >60)
GFR, Estimated: 25 mL/min — ABNORMAL LOW (ref >60)
Glucose, Bld: 122 mg/dL — ABNORMAL HIGH (ref 70–99)
Glucose, Bld: 144 mg/dL — ABNORMAL HIGH (ref 70–99)
Glucose, Bld: 156 mg/dL — ABNORMAL HIGH (ref 70–99)
Potassium: 3.2 mmol/L — ABNORMAL LOW (ref 3.5–5.1)
Potassium: 3.7 mmol/L (ref 3.5–5.1)
Potassium: 4 mmol/L (ref 3.5–5.1)
Sodium: 128 mmol/L — ABNORMAL LOW (ref 135–145)
Sodium: 129 mmol/L — ABNORMAL LOW (ref 135–145)
Sodium: 130 mmol/L — ABNORMAL LOW (ref 135–145)

## 2023-09-15 LAB — COOXEMETRY PANEL
Carboxyhemoglobin: 1.8 % — ABNORMAL HIGH (ref 0.5–1.5)
Carboxyhemoglobin: 1.6 % — ABNORMAL HIGH (ref 0.5–1.5)
Methemoglobin: 1.1 % (ref 0.0–1.5)
Methemoglobin: 0.7 % (ref 0.0–1.5)
O2 Saturation: 78.6 %
O2 Saturation: 70.7 %
Total hemoglobin: 14 g/dL (ref 12.0–16.0)
Total hemoglobin: 15 g/dL (ref 12.0–16.0)

## 2023-09-15 LAB — GLUCOSE, CAPILLARY
Glucose-Capillary: 124 mg/dL — ABNORMAL HIGH (ref 70–99)
Glucose-Capillary: 220 mg/dL — ABNORMAL HIGH (ref 70–99)
Glucose-Capillary: 177 mg/dL — ABNORMAL HIGH (ref 70–99)
Glucose-Capillary: 123 mg/dL — ABNORMAL HIGH (ref 70–99)
Glucose-Capillary: 255 mg/dL — ABNORMAL HIGH (ref 70–99)
Glucose-Capillary: 114 mg/dL — ABNORMAL HIGH (ref 70–99)

## 2023-09-15 LAB — CBC
HCT: 40.4 % (ref 39.0–52.0)
Hemoglobin: 13.8 g/dL (ref 13.0–17.0)
MCH: 32.1 pg (ref 26.0–34.0)
MCHC: 34.2 g/dL (ref 30.0–36.0)
MCV: 94 fL (ref 80.0–100.0)
Platelets: 97 K/uL — ABNORMAL LOW (ref 150–400)
RBC: 4.3 MIL/uL (ref 4.22–5.81)
RDW: 12.6 % (ref 11.5–15.5)
WBC: 10 K/uL (ref 4.0–10.5)
nRBC: 0 % (ref 0.0–0.2)

## 2023-09-15 LAB — ECHO INTRAOPERATIVE TEE
Height: 66 in
S' Lateral: 5.05 cm
Weight: 2148.16 [oz_av]

## 2023-09-15 LAB — HEPARIN LEVEL (UNFRACTIONATED)
Heparin Unfractionated: <0.1 [IU]/mL — ABNORMAL LOW (ref 0.30–0.70)
Heparin Unfractionated: 0.12 [IU]/mL — ABNORMAL LOW (ref 0.30–0.70)

## 2023-09-15 MED ORDER — SORBITOL 70 % SOLN
30.0000 mL | Freq: Once | Status: AC
  Administered 2023-09-15: 30 mL via ORAL
  Filled 2023-09-15: qty 30

## 2023-09-15 MED ORDER — POTASSIUM CHLORIDE 10 MEQ/50ML IV SOLN
10.0000 meq | INTRAVENOUS | Status: AC
  Administered 2023-09-15 (×4): 10 meq via INTRAVENOUS
  Filled 2023-09-15 (×4): qty 50

## 2023-09-15 MED ORDER — POLYETHYLENE GLYCOL 3350 17 G PO PACK
17.0000 g | PACK | Freq: Every day | ORAL | Status: DC
  Administered 2023-09-16 – 2023-09-19 (×4): 17 g via ORAL
  Filled 2023-09-15 (×5): qty 1

## 2023-09-15 MED ORDER — POTASSIUM CHLORIDE 10 MEQ/50ML IV SOLN
10.0000 meq | INTRAVENOUS | Status: AC
  Administered 2023-09-15 (×3): 10 meq via INTRAVENOUS
  Filled 2023-09-15 (×3): qty 50

## 2023-09-15 MED FILL — Lidocaine HCl Local Preservative Free (PF) Inj 2%: INTRAMUSCULAR | Qty: 14 | Status: AC

## 2023-09-15 MED FILL — Heparin Sodium (Porcine) Inj 1000 Unit/ML: Qty: 1000 | Status: AC

## 2023-09-15 MED FILL — Potassium Chloride Inj 2 mEq/ML: INTRAVENOUS | Qty: 40 | Status: AC

## 2023-09-15 NOTE — Progress Notes (Signed)
 NAME:  Joel Pitts, MRN:  [id number], DOB:  1950/07/23, LOS: 9 ADMISSION DATE:  09/06/2023, CONSULTATION DATE:  09/12/23 REFERRING MD:  Dr. Shyrl, CHIEF COMPLAINT:  post op   History of Present Illness:   3 yoM with PMH of HTN, DM, BPH, and nephrolithiasis admitted on 7/8 for further workup of several week hx of SOB, fatigue, and chest tightness.  On workup, found to have EKG with age undetermined ST anteroseptal infarct, BNP 2.2k, Trop hs 29> 25, sCr 1.53, CTA neg for PE, bilateral pleural effusions seen, TTE showed EF < 20%, possible layered apical thrombus, LV GHK, RV mildly reduced, trivial MR.  Underwent diureses and heparin  gtt. Underwent 7/11 R/LHC which revealed 99% stenosis of distal LM to proximal LAD, cardiac MRI 7/11 showed significant LM and viable LAD territory except apex.  AHF and TCTS consulted.  Underwent CABG x 1, LIMA- LAD using left internal mammaary artery off CPB.  PCCM consulted for further vent and medical management while post-op in ICU.   Pertinent  Medical History   Past Medical History:  Diagnosis Date   Diabetes mellitus without complication (HCC)    History of kidney stones    ETOH- 1 beer/ night  Significant Hospital Events: Including procedures, antibiotic start and stop dates in addition to other pertinent events   7/14: CABG x1 off pump 7/15: signed off and then re-engaged for shortness of breath. Bedside POCUS with no significant effusion to tap. Placed on HHFNC to help with PEEP. Evening placed on NO for increased PA pressures.  7/17: looks much better this morning, breathing feels better   Interim History / Subjective:  NAEON. Breathing feels better. He is diuresing. Respiratory status improved and stable. CCM will sign back off at this time.   Objective    Blood pressure 127/75, pulse 84, temperature 98.4 F (36.9 C), resp. rate (!) 25, height 5' 6 (1.676 m), weight 65.5 kg, SpO2 92%. PAP: (41-69)/(10-29) 51/16 CVP:  [5 mmHg-28 mmHg] 12  mmHg CO:  [3.7 L/min-4.2 L/min] 3.9 L/min CI:  [2.2 L/min/m2-2.45 L/min/m2] 2.29 L/min/m2      Intake/Output Summary (Last 24 hours) at 09/15/2023 0922 Last data filed at 09/15/2023 0900 Gross per 24 hour  Intake 1407.83 ml  Output 5760 ml  Net -4352.17 ml   Filed Weights   09/12/23 0644 09/13/23 0500 09/15/23 0500  Weight: 60.9 kg 68.6 kg 65.5 kg    Examination: Older male, sitting in bed, NAD On nasal cannula, iNO. Respirations even and unlabored. Crackles in bases.  Awake, alert and oriented, non focal exam  Abdomen soft  S1s2, extremities warm   Resolved problem list  AKI vs CKD Hyponatremia  Assessment and Plan   CAD s/p CABG x1, LIMA-LAD Acute systolic HF with volume overload, EF < 20% Apical LV thrombus  Postop cardioplegia R>L with ATN vs. Cardiorenal stubborn to diuresis Postop elevated PAPs HTN HLD DMT2 with hyperglycemia  BPH  P:  - Levo/vaso MAP 65 -weaning off  - Inotropes per AHF  - UOP increased and stable, diuresing off without need for CRRT at this time  - continue nasal cannula with iNO, can increase to HHFNC if he needs more flow   Respiratory status has stabilized. PCCM will sign off. Re-engage as needed.   CC time: 32 minutes   Tinnie FORBES Furth, PA-C [facility] 09/15/23 9:22 AM  Please see [email or web address] for pager details.  From 7A-7P if no response, please call [phone] After hours,  please call ELink [phone]

## 2023-09-15 NOTE — Progress Notes (Signed)
 Advanced Heart Failure Rounding Note  Cardiologist: None  Chief Complaint: acute systolic heart failure, post cardiotomy shock   Patient Profile   73 y/o male admitted w/ chest pain and new systolic heart failure and LV thrombus. EF 20%, RV mildly reduced. Cath w/ 99% stenosis of LAD, now s/p CABG x 1. Post op course c/b post cardiotomy shock and ATN.    Subjective:     7/14: S/p off pump CABG x 1 LIMA-LAD. Extubated same day  7/15: worsening dyspnea w/ anuric AKI, required escalation of inotrope's. POCUS ECHO with EF 25% RV markedly dilated an severe HK. Septum flattened. Started on iNO and high dose lasix  gtt  POD#4   Remains on DBA 5 + Epi 1 + Milrinone  0.25 + VP 0.03. iNO @20 .  CI 2.3, Co-ox 79%, CVP 12, PAP 48/15, PAPi 2.8   Diuresing well w/ lasix  gtt 30/hr and metolazone . 5.4L in UOP yesterday   Scr remains elevated but seems to be plateuing ~3.0. K 3.7   Overall feeling better. Breathing improving. Able to tolerate more POs w/o nausea. No BM yet. Still ~10 lb above pre-op wt.   Objective:   Weight Range: 65.5 kg Body mass index is 23.31 kg/m.   Vital Signs:   Temp:  [97.7 F (36.5 C)-98.8 F (37.1 C)] 98.4 F (36.9 C) (07/17 0930) Pulse Rate:  [72-96] 78 (07/17 0930) Resp:  [11-29] 23 (07/17 0930) BP: (106-145)/(51-75) 127/75 (07/17 0915) SpO2:  [90 %-99 %] 95 % (07/17 0930) Arterial Line BP: (108-181)/(39-66) 140/55 (07/17 0930) Weight:  [65.5 kg] 65.5 kg (07/17 0500) Last BM Date : 09/10/23  Weight change: Filed Weights   09/12/23 0644 09/13/23 0500 09/15/23 0500  Weight: 60.9 kg 68.6 kg 65.5 kg    Intake/Output:   Intake/Output Summary (Last 24 hours) at 09/15/2023 0938 Last data filed at 09/15/2023 0900 Gross per 24 hour  Intake 1407.83 ml  Output 5760 ml  Net -4352.17 ml      Physical Exam   General:  sitting up in bed No respiratory difficulty Neck: + RIJ swan, JVD 12 cm  Cor: RRR, + sternal dressing +CTs. Lungs: decreased BS at the  bases  Abdomen: soft, nontender, nondistended.  Extremities: trace b/l LEE  GU: + foley    Telemetry   NSR 80s, personally review   EKG   N/A   Labs    CBC Recent Labs    09/12/23 1607 09/13/23 0405 09/14/23 0759 09/15/23 0439  WBC 15.3*   < > 14.8* 10.0  NEUTROABS 11.3*  --   --   --   HGB 15.0   < > 13.9 13.8  HCT 42.7   < > 41.3 40.4  MCV 93.4   < > 96.3 94.0  PLT 192   < > 112* 97*   < > = values in this interval not displayed.   Basic Metabolic Panel Recent Labs    92/84/74 0405 09/13/23 1446 09/13/23 1757 09/14/23 2354 09/15/23 0439  NA 134* 132*   < > 129* 128*  K 4.5 5.2*   < > 4.0 3.7  CL 102 98   < > 92* 91*  CO2 18* 16*   < > 20* 21*  GLUCOSE 139* 152*   < > 156* 144*  BUN 20 25*   < > 43* 43*  CREATININE 1.82* 2.29*   < > 3.08* 3.02*  CALCIUM 8.3* 8.2*   < > 8.7* 9.0  MG 3.0* 2.7*  --   --   --    < > =  values in this interval not displayed.   Liver Function Tests Recent Labs    09/14/23 0759  AST 29  ALT 7  ALKPHOS 45  BILITOT 1.6*  PROT 6.7  ALBUMIN  4.2   No results for input(s): LIPASE, AMYLASE in the last 72 hours. Cardiac Enzymes No results for input(s): CKTOTAL, CKMB, CKMBINDEX, TROPONINI in the last 72 hours.  BNP: BNP (last 3 results) Recent Labs    09/06/23 0856  BNP 2,251.1*    ProBNP (last 3 results) No results for input(s): PROBNP in the last 8760 hours.   D-Dimer No results for input(s): DDIMER in the last 72 hours. Hemoglobin A1C No results for input(s): HGBA1C in the last 72 hours. Fasting Lipid Panel No results for input(s): CHOL, HDL, LDLCALC, TRIG, CHOLHDL, LDLDIRECT in the last 72 hours. Thyroid Function Tests No results for input(s): TSH, T4TOTAL, T3FREE, THYROIDAB in the last 72 hours.  Invalid input(s): FREET3  Other results:   Imaging    No results found.    Medications:     Scheduled Medications:  acetaminophen   1,000 mg Oral Q6H   Or    acetaminophen  (TYLENOL ) oral liquid 160 mg/5 mL  1,000 mg Per Tube Q6H   aspirin  EC  325 mg Oral Daily   Or   aspirin   324 mg Per Tube Daily   bisacodyl   10 mg Oral Daily   Or   bisacodyl   10 mg Rectal Daily   Chlorhexidine  Gluconate Cloth  6 each Topical Daily   docusate sodium   200 mg Oral Daily   feeding supplement (NEPRO CARB STEADY)  237 mL Oral Daily   insulin  aspart  0-24 Units Subcutaneous Q4H   insulin  glargine-yfgn  5 Units Subcutaneous BID   pantoprazole   40 mg Oral Daily   polyethylene glycol  17 g Oral Daily   sodium chloride  flush  3 mL Intravenous Q12H   sodium chloride  flush  3-10 mL Intravenous Q12H   sodium chloride  flush  3-10 mL Intravenous Q12H   sodium chloride  flush  3-10 mL Intravenous Q12H   sodium chloride  flush  3-10 mL Intravenous Q12H   tamsulosin   0.4 mg Oral PC supper    Infusions:  albumin  human Stopped (09/13/23 0527)   dexmedetomidine  (PRECEDEX ) IV infusion Stopped (09/12/23 1446)   DOBUTamine  5 mcg/kg/min (09/15/23 0900)   epinephrine  1 mcg/min (09/15/23 0900)   furosemide  (LASIX ) 200 mg in dextrose  5 % 100 mL (2 mg/mL) infusion 30 mg/hr (09/15/23 0900)   heparin  500 Units/hr (09/15/23 0900)   milrinone  0.25 mcg/kg/min (09/15/23 0900)   norepinephrine  (LEVOPHED ) Adult infusion Stopped (09/13/23 1113)   potassium chloride  50 mL/hr at 09/15/23 0900   vasopressin  0.03 Units/min (09/15/23 0900)    PRN Medications: albumin  human, dextrose , HYDROmorphone  (DILAUDID ) injection, ondansetron  (ZOFRAN ) IV, mouth rinse, mouth rinse, oxyCODONE , sodium chloride  flush, sodium chloride  flush, sodium chloride  flush, sodium chloride  flush, sodium chloride  flush, traMADol      Assessment/Plan   1. Acute systolic CHF w/ Post Cardiology Shock: Symptoms for about 2 wks, no preceeding chest pain or viral syndrome. Echo showed EF 20%, diffuse hypokinesis, mild RV dysfunction, cannot rule out apical thrombus on contrast images.  HS-TnI minimally elevated at  admission with no trend.  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  Based on LHC, this is an ischemic cardiomyopathy.  99% stenosis of the ostial LAD with TIMI 2 flow down the vessel and weak R>L collaterals.  Suspect unrecognized anterior MI about 2 wks ago with development of CHF.  RHC  with low (but not markedly low) CI at 2.04, mildly elevated PCWP but normal filling pressures. cMRI suggested LAD territory viable.  - now s/p CABG x 1, LIMA-LAD c/b post-cardiotomy shock and ATN   - POCUS ECHO 7/15 with EF 25% RV markedly dilated an severe HK. Septum flattened - Continues w/ multiple inotropic/pressor support but making progress. Now diuresing well w/ Lasix  gtt. CI 2.2, PAPi 2.8, MAP 72. Remains volume up, ~10 lb above pre-op. CVP 12 - Continue Milrinone  at 0.25 + Epi 1.  - Reduce DBA to 2.5 and recheck afternoon co-ox. If stable, will stop DBA  - Continue Lasix  gtt at 30/hr  - Continue iNO today to help w/ diuresis  - d/w Dr. Shyrl. Plan to d/c CTs today     2. CAD: Suspect unrecognized anterior MI 2+ weeks ago.  LHC showed that the ostial LAD is severely diseased and heavily calcified. There is 99% stenosis with TIMI 2 flow down the LAD. There are weak collaterals from RCA to LAD. I reviewed films with Dr. Wonda. There is no good landing zone for stenting of ostial LAD, would require involvement of distal left main with possible jeopardization of the LCx. S/p off pump CABG 7/14 - stable w/o CP  - continue ASA + statin  - no ? blocker w/ low output   3. AKI : ?CKD at baseline with DM2 and HTN. SCr 1.5 on admission, improved pre-op but developed post-op AKI. Likely ATN. SCr now up to 3 but seems to be plateauing. Responding well to Lasix  gtt - continue to support CO and MAP per above  - continue lasix  gtt today for further diuresis  - follow UOP and BMP closely   4.  Apical thrombus: Noted on contrast images and somewhat equivocal. Confirmed by cMRI, layered LV apical mural thrombus  - on heparin  gtt   5.  Acute Hypoxic Respiratory Failure - on HHF [place].  - improving w/ diuresis and iNO. Can try weaning iNO tomorrow  - RT and CCM following    CRITICAL CARE Performed by: Caffie Shed   Total critical care time: 15 minutes  Critical care time was exclusive of separately billable procedures and treating other patients.  Critical care was necessary to treat or prevent imminent or life-threatening deterioration.  Critical care was time spent personally by me on the following activities: development of treatment plan with patient and/or surrogate as well as nursing, discussions with consultants, evaluation of patient's response to treatment, examination of patient, obtaining history from patient or surrogate, ordering and performing treatments and interventions, ordering and review of laboratory studies, ordering and review of radiographic studies, pulse oximetry and re-evaluation of patient's condition.    Length of Stay: [address], PA-C  09/15/2023, 9:38 AM  Advanced Heart Failure Team Pager [phone] (M-F; 7a - 5p)  Please contact [facility] for night-coverage after hours (5p -7a ) and weekends on [email or web address]

## 2023-09-15 NOTE — Progress Notes (Signed)
 PHARMACY - ANTICOAGULATION CONSULT NOTE  Pharmacy Consult for heparin  Indication: Echo showing apical clot  Allergies  Allergen Reactions   Statins Diarrhea    Patient Measurements: Height: 5' 6 (167.6 cm) Weight: 65.5 kg (144 lb 6.4 oz) IBW/kg (Calculated) : 63.8 HEPARIN  DW (KG): 60.9  Vital Signs: Temp: 98.8 F (37.1 C) (07/17 2105) Temp Source: Core (07/17 1630) BP: 117/67 (07/17 2100) Pulse Rate: 79 (07/17 2105)  Labs: Recent Labs    09/13/23 1446 09/13/23 1757 09/14/23 0759 09/14/23 1215 09/14/23 1722 09/14/23 2354 09/15/23 0439 09/15/23 1536 09/15/23 2016  HGB 13.9 14.3 13.9  --   --   --  13.8  --   --   HCT 41.8 42.0 41.3  --   --   --  40.4  --   --   PLT 106*  --  112*  --   --   --  97*  --   --   HEPARINUNFRC  --   --   --   --  <0.10*  --  <0.10*  --  0.12*  CREATININE 2.29* 2.10* 2.95*   < >  --  3.08* 3.02* 2.65*  --    < > = values in this interval not displayed.    Estimated Creatinine Clearance: 22.1 mL/min (A) (by C-G formula based on SCr of 2.65 mg/dL (H)).   Assessment: 73 yo male with HF and apical clot on ECHO. Pharmacy consulted to dose IV heparin . Underwent cardiac cath finding severely disease LAD with weak collaterals - reviewed with interventional and now being evaluated by CTVS. Patient had off-pump CABG procedure on 09/12/23 complicated by post-operative shock state.   7/17 AM: Heparin  level <0.10 - appropriate level while chest tubes remain in place. No issues with bleeding or infusion noted.  -- 1400: Chest tubes removed earlier this morning. Discussed with CTS, increasing to full-dose anticoagulation today. Patient previously therapeutic on 1050 units/hr, will resume heparin  at this rate.  -- PM follow up: Heparin  level subtherapeutic at 0.12, no bleeding or issues with line that RN is aware of. We evaluated previous chest tube site and no oozing to be noted.  Goal of Therapy:  Heparin  level 0.3-0.7 units/mL while chest tubes are  in place Monitor platelets by anticoagulation protocol: Yes   Plan:  Increase heparin  to 1250 units/hr -- not bolusing with previous concern for bleeding Repeat heparin  level in 8 hours Daily heparin  level, CBC Monitor s/sx bleeding F/u long-term anticoagulation plan  Thank you for allowing pharmacy to participate in this patient's care,  Randall Call, PharmD, BCPS 09/15/23 9:16 PM

## 2023-09-15 NOTE — Progress Notes (Addendum)
 PHARMACY - ANTICOAGULATION CONSULT NOTE  Pharmacy Consult for heparin  Indication: Echo showing apical clot  Allergies  Allergen Reactions   Statins Diarrhea    Patient Measurements: Height: 5' 6 (167.6 cm) Weight: 65.5 kg (144 lb 6.4 oz) IBW/kg (Calculated) : 63.8 HEPARIN  DW (KG): 60.9  Vital Signs: Temp: 98.8 F (37.1 C) (07/17 1315) Temp Source: Core (07/17 0800) BP: 118/64 (07/17 1315) Pulse Rate: 88 (07/17 1315)  Labs: Recent Labs    09/13/23 1446 09/13/23 1757 09/14/23 0759 09/14/23 1215 09/14/23 1722 09/14/23 2354 09/15/23 0439  HGB 13.9 14.3 13.9  --   --   --  13.8  HCT 41.8 42.0 41.3  --   --   --  40.4  PLT 106*  --  112*  --   --   --  97*  HEPARINUNFRC  --   --   --   --  <0.10*  --  <0.10*  CREATININE 2.29* 2.10* 2.95* 2.91*  --  3.08* 3.02*    Estimated Creatinine Clearance: 19.4 mL/min (A) (by C-G formula based on SCr of 3.02 mg/dL (H)).   Assessment: 73 yo male with HF and apical clot on ECHO. Pharmacy consulted to dose IV heparin . Underwent cardiac cath finding severely disease LAD with weak collaterals - reviewed with interventional and now being evaluated by CTVS. Patient had off-pump CABG procedure on 09/12/23 complicated by post-operative shock state.   7/17 AM: Heparin  level <0.10 - appropriate level while chest tubes remain in place. No issues with bleeding or infusion noted.   ADDENDUM 1400: Chest tubes removed earlier this morning. Discussed with CTS, increasing to full-dose anticoagulation today. Patient previously therapeutic on 1050 units/hr, will resume heparin  at this rate.   Goal of Therapy:  Heparin  level 0.3-0.7 units/mL while chest tubes are in place Monitor platelets by anticoagulation protocol: Yes   Plan:  Increase heparin  to 1050 units/hr Monitor heparin  level in 8 hours Daily heparin  level, CBC Monitor s/sx bleeding F/u long-term anticoagulation plan  Thank you for allowing pharmacy to participate in this patient's  care,  Morna Breach, PharmD PGY2 Cardiology Pharmacy Resident 09/15/2023 2:04 PM

## 2023-09-15 NOTE — Anesthesia Postprocedure Evaluation (Signed)
 Anesthesia Post Note  Patient: Joel Pitts  Procedure(s) Performed: OFF PUMP CORONARY ARTERY BYPASS GRAFTING X 1, USING LEFT INTERNAL MAMMARY ARTERY (Chest)     Patient location during evaluation: SICU Anesthesia Type: General Level of consciousness: sedated Pain management: pain level controlled Vital Signs Assessment: post-procedure vital signs reviewed and stable Respiratory status: patient remains intubated per anesthesia plan Cardiovascular status: stable Postop Assessment: no apparent nausea or vomiting Anesthetic complications: no   No notable events documented.  Last Vitals:    Last Pain:                 Garnette FORBES Skillern

## 2023-09-15 NOTE — Progress Notes (Signed)
      [address]       [place] [address]             [phone]                 3 Days Post-Op Procedure(s) (LRB): OFF PUMP CORONARY ARTERY BYPASS GRAFTING X 1, USING LEFT INTERNAL MAMMARY ARTERY (N/A)   Events: No events  _______________________________________________________________ Vitals: BP (!) 145/51   Pulse 85   Temp 98.6 F (37 C)   Resp 19   Ht 5' 6 (1.676 m)   Wt 65.5 kg   SpO2 96%   BMI 23.31 kg/m  Filed Weights   09/12/23 0644 09/13/23 0500 09/15/23 0500  Weight: 60.9 kg 68.6 kg 65.5 kg     - Neuro: alert NAD  - Cardiovascular: sinus  Drips: dob 5, epi 6, vaso 0.03 milr 0.25  PAP: (41-69)/(10-29) 45/13 CVP:  [5 mmHg-28 mmHg] 7 mmHg CO:  [3.7 L/min-4.2 L/min] 3.9 L/min CI:  [2.2 L/min/m2-2.45 L/min/m2] 2.29 L/min/m2  - Pulm: EWOB  ABG    Component Value Date/Time   PHART 7.300 (L) 09/12/2023 1606   PCO2ART 40.1 09/12/2023 1606   PO2ART 75 (L) 09/12/2023 1606   HCO3 19.7 (L) 09/12/2023 1606   TCO2 15 (L) 09/13/2023 1757   ACIDBASEDEF 6.0 (H) 09/12/2023 1606   O2SAT 78.6 09/15/2023 0440    - Abd: ND - Extremity: warm  .Intake/Output      07/16 0701 07/17 0700 07/17 0701 07/18 0700   P.O.  240   I.V. (mL/kg) 1066.1 (16.3)    IV Piggyback 100    Total Intake(mL/kg) 1166.1 (17.8) 240 (3.7)   Urine (mL/kg/hr) 5445 (3.5) 225 (2.4)   Chest Tube 40    Total Output 5485 225   Net -4318.9 +15           _______________________________________________________________ Labs:    Latest Ref Rng & Units 09/15/2023    4:39 AM 09/14/2023    7:59 AM 09/13/2023    5:57 PM  CBC  WBC 4.0 - 10.5 K/uL 10.0  14.8    Hemoglobin 13.0 - 17.0 g/dL 86.1  86.0  85.6   Hematocrit 39.0 - 52.0 % 40.4  41.3  42.0   Platelets 150 - 400 K/uL 97  112        Latest Ref Rng & Units 09/15/2023    4:39 AM 09/14/2023   11:54 PM 09/14/2023   12:15 PM  CMP  Glucose 70 - 99 mg/dL 855  843  886   BUN 8 - 23 mg/dL 43  43  35   Creatinine 0.61  - 1.24 mg/dL 6.97  6.91  7.08   Sodium 135 - 145 mmol/L 128  129  131   Potassium 3.5 - 5.1 mmol/L 3.7  4.0  3.9   Chloride 98 - 111 mmol/L 91  92  96   CO2 22 - 32 mmol/L 21  20  19    Calcium 8.9 - 10.3 mg/dL 9.0  8.7  8.6     CXR: PV congestion  _______________________________________________________________  Assessment and Plan: POD 3 s/p CABG, CHF  Neuro: pain controlled.   CV: on chemical support, appreciate HF team. Weaning support today.   Pulm: IS Renal: made 4L, but creat up to 3.   GI: on diet Heme: stable ID: [email or web address] Endo: SSI Dispo: continue ICU care   Joel Pitts 09/15/2023 8:27 AM

## 2023-09-15 NOTE — Progress Notes (Signed)
      [address]       [place] [address]             [phone]      POD # 3 OPCAB x 1  Resting comfortably  BP 121/64 (BP Location: Left Leg)   Pulse 90   Temp 99.3 F (37.4 C)   Resp (!) 23   Ht 5' 6 (1.676 m)   Wt 65.5 kg   SpO2 97%   BMI 23.31 kg/m  HFNC 10L 97% sat  42/13 CI 1.85 co-ox 70 On dobutamine  and milrinone   Intake/Output Summary (Last 24 hours) at 09/15/2023 1842 Last data filed at 09/15/2023 1709 Gross per 24 hour  Intake 1108.61 ml  Output 6295 ml  Net -5186.39 ml   K 3.2, creatinine 2.65 (down from 3.02) K being supplemented  Elspeth C. Kerrin, MD [facility] [phone]

## 2023-09-15 NOTE — Progress Notes (Signed)
 PHARMACY - ANTICOAGULATION CONSULT NOTE  Pharmacy Consult for heparin  Indication: Echo showing apical clot  Allergies  Allergen Reactions   Statins Diarrhea    Patient Measurements: Height: 5' 6 (167.6 cm) Weight: 65.5 kg (144 lb 6.4 oz) IBW/kg (Calculated) : 63.8 HEPARIN  DW (KG): 60.9  Vital Signs: Temp: 98.4 F (36.9 C) (07/17 1030) Temp Source: Core (07/17 0800) BP: 117/60 (07/17 1030) Pulse Rate: 79 (07/17 1030)  Labs: Recent Labs    09/12/23 1048 09/12/23 1205 09/13/23 1446 09/13/23 1757 09/14/23 0759 09/14/23 1215 09/14/23 1722 09/14/23 2354 09/15/23 0439  HGB 15.0   < > 13.9 14.3 13.9  --   --   --  13.8  HCT 43.6   < > 41.8 42.0 41.3  --   --   --  40.4  PLT 127*   < > 106*  --  112*  --   --   --  97*  APTT 33  --   --   --   --   --   --   --   --   LABPROT 16.0*  --   --   --   --   --   --   --   --   INR 1.2  --   --   --   --   --   --   --   --   HEPARINUNFRC  --   --   --   --   --   --  <0.10*  --  <0.10*  CREATININE  --    < > 2.29* 2.10* 2.95* 2.91*  --  3.08* 3.02*   < > = values in this interval not displayed.    Estimated Creatinine Clearance: 19.4 mL/min (A) (by C-G formula based on SCr of 3.02 mg/dL (H)).   Assessment: 73 yo male with HF and apical clot on ECHO. Pharmacy consulted to dose IV heparin . Underwent cardiac cath finding severely disease LAD with weak collaterals - reviewed with interventional and now being evaluated by CTVS. Patient had off-pump CABG procedure on 09/12/23 complicated by post-operative shock state.   7/17 AM: Heparin  level <0.10 - appropriate level while chest tubes remain in place. No issues with bleeding or infusion noted.   Goal of Therapy:  Heparin  level <0.3 units/mL while chest tubes are in place Monitor platelets by anticoagulation protocol: Yes   Plan:  Continue heparin  at 500 units/hr - keep fixed dose while chest tubes remain in place Next heparin  level with AM labs Daily heparin  level,  CBC Monitor s/sx bleeding  Thank you for allowing pharmacy to participate in this patient's care,  Randall Call, PharmD, BCPS 09/15/23 10:43 AM Please check AMION for all [facility] Pharmacy phone numbers After 10:00 PM, call Main Pharmacy [phone]

## 2023-09-15 NOTE — Plan of Care (Signed)
  Problem: Health Behavior/Discharge Planning: Goal: Ability to manage health-related needs will improve Outcome: Progressing   Problem: Clinical Measurements: Goal: Ability to maintain clinical measurements within normal limits will improve Outcome: Progressing Goal: Will remain free from infection Outcome: Progressing Goal: Diagnostic test results will improve Outcome: Progressing   Problem: Activity: Goal: Risk for activity intolerance will decrease Outcome: Progressing   Problem: Elimination: Goal: Will not experience complications related to bowel motility Outcome: Progressing Goal: Will not experience complications related to urinary retention Outcome: Progressing

## 2023-09-16 ENCOUNTER — Other Ambulatory Visit: Payer: Self-pay

## 2023-09-16 DIAGNOSIS — I5023 Acute on chronic systolic (congestive) heart failure: Secondary | ICD-10-CM | POA: Diagnosis not present

## 2023-09-16 LAB — RENAL FUNCTION PANEL
Albumin: 3.9 g/dL (ref 3.5–5.0)
Anion gap: 16 — ABNORMAL HIGH (ref 5–15)
BUN: 48 mg/dL — ABNORMAL HIGH (ref 8–23)
CO2: 27 mmol/L (ref 22–32)
Calcium: 9.5 mg/dL (ref 8.9–10.3)
Chloride: 88 mmol/L — ABNORMAL LOW (ref 98–111)
Creatinine, Ser: 2.76 mg/dL — ABNORMAL HIGH (ref 0.61–1.24)
GFR, Estimated: 23 mL/min — ABNORMAL LOW (ref >60)
Glucose, Bld: 145 mg/dL — ABNORMAL HIGH (ref 70–99)
Phosphorus: 3.4 mg/dL (ref 2.5–4.6)
Potassium: 3.5 mmol/L (ref 3.5–5.1)
Sodium: 131 mmol/L — ABNORMAL LOW (ref 135–145)

## 2023-09-16 LAB — POCT I-STAT 7, (LYTES, BLD GAS, ICA,H+H)
Acid-Base Excess: 7 mmol/L — ABNORMAL HIGH (ref 0.0–2.0)
Bicarbonate: 30.6 mmol/L — ABNORMAL HIGH (ref 20.0–28.0)
Calcium, Ion: 1.12 mmol/L — ABNORMAL LOW (ref 1.15–1.40)
HCT: 41 % (ref 39.0–52.0)
Hemoglobin: 13.9 g/dL (ref 13.0–17.0)
O2 Saturation: 100 %
Patient temperature: 37
Potassium: 4.2 mmol/L (ref 3.5–5.1)
Sodium: 127 mmol/L — ABNORMAL LOW (ref 135–145)
TCO2: 32 mmol/L (ref 22–32)
pCO2 arterial: 37.1 mmHg (ref 32–48)
pH, Arterial: 7.524 — ABNORMAL HIGH (ref 7.35–7.45)
pO2, Arterial: 194 mmHg — ABNORMAL HIGH (ref 83–108)

## 2023-09-16 LAB — CBC
HCT: 40 % (ref 39.0–52.0)
Hemoglobin: 14.5 g/dL (ref 13.0–17.0)
MCH: 33 pg (ref 26.0–34.0)
MCHC: 36.3 g/dL — ABNORMAL HIGH (ref 30.0–36.0)
MCV: 91.1 fL (ref 80.0–100.0)
Platelets: 133 K/uL — ABNORMAL LOW (ref 150–400)
RBC: 4.39 MIL/uL (ref 4.22–5.81)
RDW: 12.2 % (ref 11.5–15.5)
WBC: 8.6 K/uL (ref 4.0–10.5)
nRBC: 0 % (ref 0.0–0.2)

## 2023-09-16 LAB — GLUCOSE, CAPILLARY
Glucose-Capillary: 143 mg/dL — ABNORMAL HIGH (ref 70–99)
Glucose-Capillary: 240 mg/dL — ABNORMAL HIGH (ref 70–99)
Glucose-Capillary: 218 mg/dL — ABNORMAL HIGH (ref 70–99)
Glucose-Capillary: 94 mg/dL (ref 70–99)
Glucose-Capillary: 365 mg/dL — ABNORMAL HIGH (ref 70–99)
Glucose-Capillary: 370 mg/dL — ABNORMAL HIGH (ref 70–99)
Glucose-Capillary: 182 mg/dL — ABNORMAL HIGH (ref 70–99)
Glucose-Capillary: 145 mg/dL — ABNORMAL HIGH (ref 70–99)

## 2023-09-16 LAB — COOXEMETRY PANEL
Carboxyhemoglobin: 1.6 % — ABNORMAL HIGH (ref 0.5–1.5)
Carboxyhemoglobin: 0.8 % (ref 0.5–1.5)
Methemoglobin: 0.7 % (ref 0.0–1.5)
Methemoglobin: <0.7 % (ref 0.0–1.5)
O2 Saturation: 72.3 %
O2 Saturation: 68.5 %
Total hemoglobin: 14.8 g/dL (ref 12.0–16.0)
Total hemoglobin: 14.8 g/dL (ref 12.0–16.0)

## 2023-09-16 LAB — HEPARIN LEVEL (UNFRACTIONATED)
Heparin Unfractionated: 0.22 [IU]/mL — ABNORMAL LOW (ref 0.30–0.70)
Heparin Unfractionated: 0.28 [IU]/mL — ABNORMAL LOW (ref 0.30–0.70)

## 2023-09-16 LAB — MAGNESIUM: Magnesium: 2.4 mg/dL (ref 1.7–2.4)

## 2023-09-16 MED ORDER — ASPIRIN 81 MG PO TBEC
81.0000 mg | DELAYED_RELEASE_TABLET | Freq: Every day | ORAL | Status: DC
  Administered 2023-09-16 – 2023-09-23 (×8): 81 mg via ORAL
  Filled 2023-09-16 (×8): qty 1

## 2023-09-16 MED ORDER — POTASSIUM CHLORIDE CRYS ER 10 MEQ PO TBCR
40.0000 meq | EXTENDED_RELEASE_TABLET | Freq: Once | ORAL | Status: AC
  Administered 2023-09-16: 40 meq via ORAL
  Filled 2023-09-16: qty 4
  Filled 2023-09-16: qty 2

## 2023-09-16 MED ORDER — ENSURE PLUS HIGH PROTEIN PO LIQD
237.0000 mL | Freq: Two times a day (BID) | ORAL | Status: DC
  Administered 2023-09-16 – 2023-09-23 (×11): 237 mL via ORAL

## 2023-09-16 NOTE — Inpatient Diabetes Management (Signed)
 Inpatient Diabetes Program Recommendations  AACE/ADA: New Consensus Statement on Inpatient Glycemic Control (2015)  Target Ranges:  Prepandial:   less than 140 mg/dL      Peak postprandial:   less than 180 mg/dL (1-2 hours)      Critically ill patients:  140 - 180 mg/dL   Lab Results  Component Value Date   GLUCAP 218 (H) 09/16/2023   HGBA1C 7.2 (H) 09/09/2023    Review of Glycemic Control  Latest Reference Range & Units 09/15/23 19:51 09/15/23 23:17 09/16/23 03:50 09/16/23 07:53 09/16/23 11:15  Glucose-Capillary 70 - 99 mg/dL 744 (H) 885 (H) 856 (H) 240 (H) 218 (H)   Diabetes history: DM 2 Outpatient Diabetes medications:  Metformin 500 mg bid Current orders for Inpatient glycemic control:  Novolog  0-24 units q 4 hours Semglee  5 units bid  Inpatient Diabetes Program Recommendations:    Consider increasing Semglee  to 8 units bid.    Thanks,  Randall Bullocks, RN, BC-ADM Inpatient Diabetes Coordinator Pager [phone]  (8a-5p)

## 2023-09-16 NOTE — Progress Notes (Signed)
 PHARMACY - ANTICOAGULATION CONSULT NOTE  Pharmacy Consult for heparin  Indication: Echo showing apical clot  Allergies  Allergen Reactions   Statins Diarrhea    Patient Measurements: Height: 5' 6 (167.6 cm) Weight: 60.5 kg (133 lb 6.1 oz) IBW/kg (Calculated) : 63.8 HEPARIN  DW (KG): 60.9  Vital Signs: Temp: 98.8 F (37.1 C) (07/18 1315) Temp Source: Core (07/18 0800) BP: 107/73 (07/18 1215) Pulse Rate: 78 (07/18 1315)  Labs: Recent Labs    09/14/23 0759 09/14/23 1215 09/15/23 0439 09/15/23 1536 09/15/23 2016 09/16/23 0410 09/16/23 0531 09/16/23 1231 09/16/23 1400  HGB 13.9  --  13.8  --   --   --   --  13.9 14.5  HCT 41.3  --  40.4  --   --   --   --  41.0 40.0  PLT 112*  --  97*  --   --   --   --   --  133*  HEPARINUNFRC  --    < > <0.10*  --  0.12*  --  0.22*  --  0.28*  CREATININE 2.95*   < > 3.02* 2.65*  --  2.76*  --   --   --    < > = values in this interval not displayed.    Estimated Creatinine Clearance: 20.1 mL/min (A) (by C-G formula based on SCr of 2.76 mg/dL (H)).   Assessment: 73 yo male with HF and apical clot on ECHO. Pharmacy consulted to dose IV heparin . Underwent cardiac cath finding severely disease LAD with weak collaterals - reviewed with interventional and now being evaluated by CTVS. Patient had off-pump CABG procedure on 09/12/23 complicated by post-operative shock state.   Heparin  level remains subtherapeutic (0.28) on infusion at 1400 units/hr. No overt bleeding or complications noted.  Platelet count trending back up.  Goal of Therapy:  Heparin  level 0.3-0.7 units/mL while chest tubes are in place Monitor platelets by anticoagulation protocol: Yes   Plan:  Increase heparin  to 1500 units/hr Daily heparin  level and CBC.  Harlene Barlow, [employer], BCCP Clinical Pharmacist  09/16/2023 3:29 PM   [facility] pharmacy phone numbers are listed on [email or web address]

## 2023-09-16 NOTE — Progress Notes (Signed)
 Advanced Heart Failure Rounding Note  Cardiologist: None  AHF Consulting MD: Chief Complaint: acute systolic heart failure, post cardiotomy shock  Patient Profile   73 y/o male admitted w/ chest pain and new systolic heart failure and LV thrombus. EF 20%, RV mildly reduced. Cath w/ 99% stenosis of LAD, now s/p CABG x 1. Post op course c/b post cardiotomy shock and ATN.    Subjective:     7/14: S/p off pump CABG x 1 LIMA-LAD. Extubated same day  7/15: worsening dyspnea w/ anuric AKI, required escalation of inotrope's. POCUS ECHO with EF 25% RV markedly dilated an severe HK. Septum flattened. Started on iNO and high dose lasix  gtt  POD#5 PAP: (24-53)/(1-23) 26/1 CVP:  [0 mmHg-18 mmHg] 0 mmHg CO:  [2.9 L/min-4.1 L/min] 4.1 L/min CI:  [1.7 L/min/m2-2.42 L/min/m2] 2.42 L/min/m2   Co-ox 72, CO/CI 3.4/2.03, PA 37/6, CVP 4 Turned off dobutamine  this morning, off epi Remains on milrinone  0.25 and vaso Will wean iNO and then vaso following Swan out if stable this afternoon Holding diuretics given normal filling pressures  Eliquis tomorrow. ASA 81  Feeling significantly improved.  Objective:   Weight Range: 60.5 kg Body mass index is 21.53 kg/m.   Vital Signs:   Temp:  [97.9 F (36.6 C)-99.5 F (37.5 C)] 98.2 F (36.8 C) (07/18 0700) Pulse Rate:  [76-92] 81 (07/18 0700) Resp:  [13-30] 19 (07/18 0700) BP: (101-145)/(51-77) 118/68 (07/18 0700) SpO2:  [88 %-99 %] 95 % (07/18 0700) Arterial Line BP: (104-157)/(40-69) 151/69 (07/18 0700) Weight:  [60.5 kg] 60.5 kg (07/18 0500) Last BM Date :  (PTA)  Weight change: Filed Weights   09/13/23 0500 09/15/23 0500 09/16/23 0500  Weight: 68.6 kg 65.5 kg 60.5 kg    Intake/Output:   Intake/Output Summary (Last 24 hours) at 09/16/2023 0754 Last data filed at 09/16/2023 0700 Gross per 24 hour  Intake 1624.24 ml  Output 5520 ml  Net -3895.76 ml      Physical Exam   General:  sitting up in bed , NAD Neck: + RIJ swan, JVP  mildly elevated Cor: RRR, + sternal dressing Lungs: normal WOB Abdomen: soft, nontender, nondistended.  Extremities: trace b/l LEE  GU: + foley    Telemetry   NSR 80s  Labs    CBC Recent Labs    09/14/23 0759 09/15/23 0439  WBC 14.8* 10.0  HGB 13.9 13.8  HCT 41.3 40.4  MCV 96.3 94.0  PLT 112* 97*   Basic Metabolic Panel Recent Labs    92/84/74 1446 09/13/23 1757 09/15/23 1536 09/16/23 0410  NA 132*   < > 130* 131*  K 5.2*   < > 3.2* 3.5  CL 98   < > 89* 88*  CO2 16*   < > 26 27  GLUCOSE 152*   < > 122* 145*  BUN 25*   < > 45* 48*  CREATININE 2.29*   < > 2.65* 2.76*  CALCIUM 8.2*   < > 9.3 9.5  MG 2.7*  --   --  2.4  PHOS  --   --   --  3.4   < > = values in this interval not displayed.   Liver Function Tests Recent Labs    09/14/23 0759 09/16/23 0410  AST 29  --   ALT 7  --   ALKPHOS 45  --   BILITOT 1.6*  --   PROT 6.7  --   ALBUMIN  4.2 3.9    Medications:  Scheduled Medications:  acetaminophen   1,000 mg Oral Q6H   Or   acetaminophen  (TYLENOL ) oral liquid 160 mg/5 mL  1,000 mg Per Tube Q6H   aspirin  EC  325 mg Oral Daily   Or   aspirin   324 mg Per Tube Daily   bisacodyl   10 mg Oral Daily   Or   bisacodyl   10 mg Rectal Daily   Chlorhexidine  Gluconate Cloth  6 each Topical Daily   docusate sodium   200 mg Oral Daily   feeding supplement (NEPRO CARB STEADY)  237 mL Oral Daily   insulin  aspart  0-24 Units Subcutaneous Q4H   insulin  glargine-yfgn  5 Units Subcutaneous BID   pantoprazole   40 mg Oral Daily   polyethylene glycol  17 g Oral Daily   potassium chloride   40 mEq Oral Once   sodium chloride  flush  3 mL Intravenous Q12H   sodium chloride  flush  3-10 mL Intravenous Q12H   sodium chloride  flush  3-10 mL Intravenous Q12H   sodium chloride  flush  3-10 mL Intravenous Q12H   sodium chloride  flush  3-10 mL Intravenous Q12H   tamsulosin   0.4 mg Oral PC supper    Infusions:  albumin  human Stopped (09/13/23 0527)   dexmedetomidine   (PRECEDEX ) IV infusion Stopped (09/12/23 1446)   DOBUTamine  2.5 mcg/kg/min (09/16/23 0700)   epinephrine  Stopped (09/16/23 0602)   heparin  1,400 Units/hr (09/16/23 0700)   milrinone  0.25 mcg/kg/min (09/16/23 0700)   norepinephrine  (LEVOPHED ) Adult infusion Stopped (09/13/23 1113)   vasopressin  0.03 Units/min (09/16/23 0700)    PRN Medications: albumin  human, dextrose , HYDROmorphone  (DILAUDID ) injection, ondansetron  (ZOFRAN ) IV, mouth rinse, mouth rinse, oxyCODONE , sodium chloride  flush, sodium chloride  flush, sodium chloride  flush, sodium chloride  flush, sodium chloride  flush, traMADol      Assessment/Plan   Acute systolic CHF w/ Post Cardiotomy Shock:  - Suspect late presentation of anterior STEMI, CMR indicated some viability - s/p CABG x 1, LIMA-LAD c/b post-cardiotomy shock and ATN   - Required dual inotropes and high dose diuretics but now significantly improved, filling pressures normalized - Back to preop weight, autodiuresing, holding lasix  today - Stop DBA, will wean iNO following this - If stable this afternoon remove swan - Continue milrinone  0.25, can likely d/c vaso after iNO off - Will need GDMT as renal function recovers  CAD: Suspect missed LAD STEMI. S/p off pump CABG 7/14. EKG with large anterior q waves - stable w/o CP  - continue ASA + heparin  + statin, decrease aspirin  to 81mg  given blood thinners - Eliquis if stable tomorrow - no ? blocker w/ low output   AKI : Suspect post operative ATN, improved with high dose diuretics - continue to support CO and MAP per above  - Off lasix  gtt, hold today as he is autodiuresing - Remove foley  Apical thrombus: Noted on contrast images and somewhat equivocal. Confirmed by cMRI, layered LV apical mural thrombus  - on heparin  gtt   Acute Hypoxic Respiratory Failure - on HHF [place].  - wean iNO with RT assistance   Length of Stay: 10  CRITICAL CARE Performed by: Morene JINNY Brownie   Total critical care time: 35  minutes  Critical care time was exclusive of separately billable procedures and treating other patients.  Critical care was necessary to treat or prevent imminent or life-threatening deterioration.  Critical care was time spent personally by me on the following activities: development of treatment plan with patient and/or surrogate as well as nursing, discussions with consultants, evaluation of  patient's response to treatment, examination of patient, obtaining history from patient or surrogate, ordering and performing treatments and interventions, ordering and review of laboratory studies, ordering and review of radiographic studies, pulse oximetry and re-evaluation of patient's condition.

## 2023-09-16 NOTE — Progress Notes (Signed)
 Peripherally Inserted Central Catheter Placement  The IV Nurse has discussed with the patient and/or persons authorized to consent for the patient, the purpose of this procedure and the potential benefits and risks involved with this procedure.  The benefits include less needle sticks, lab draws from the catheter, and the patient may be discharged home with the catheter. Risks include, but not limited to, infection, bleeding, blood clot (thrombus formation), and puncture of an artery; nerve damage and irregular heartbeat and possibility to perform a PICC exchange if needed/ordered by physician.  Alternatives to this procedure were also discussed.  Bard Power PICC patient education guide, fact sheet on infection prevention and patient information card has been provided to patient /or left at bedside.    PICC Placement Documentation  PICC Triple Lumen 09/16/23 Left Brachial 41 cm 0 cm (Active)  Indication for Insertion or Continuance of Line Vasoactive infusions 09/16/23 1258  Exposed Catheter (cm) 0 cm 09/16/23 1258  Site Assessment Clean, Dry, Intact 09/16/23 1258  Lumen #1 Status Flushed;Blood return noted;Saline locked 09/16/23 1258  Lumen #2 Status Flushed;Blood return noted;Saline locked 09/16/23 1258  Lumen #3 Status Flushed;Blood return noted;Saline locked 09/16/23 1258  Dressing Type Transparent 09/16/23 1258  Dressing Status Antimicrobial disc/dressing in place 09/16/23 1258  Line Care Connections checked and tightened 09/16/23 1258  Line Adjustment (NICU/IV Team Only) No 09/16/23 1258  Dressing Intervention New dressing 09/16/23 1258  Dressing Change Due 09/23/23 09/16/23 1258       Joel Pitts 09/16/2023, 1:00 PM

## 2023-09-16 NOTE — Progress Notes (Signed)
 Nutrition Follow-up  DOCUMENTATION CODES:   Severe malnutrition in context of chronic illness  INTERVENTION:   Consider liberalizing diet   Encourage small frequent meals; pt likes to snack on banana with peanut butter and crackers. Ensure pt has available each day  Change to Ensure Plus High Protein po BID, each supplement provides 350 kcal and 20 grams of protein  D/C Nepro  Continue MVI with Minerals   NUTRITION DIAGNOSIS:   Severe Malnutrition related to chronic illness as evidenced by moderate fat depletion, severe fat depletion, moderate muscle depletion, severe muscle depletion.  Being addressed via supplements, snacks  GOAL:   Patient will meet greater than or equal to 90% of their needs  Progressing  MONITOR:   PO intake, Supplement acceptance, Labs, Weight trends, Skin, I & O's  REASON FOR ASSESSMENT:   Consult Diet education  ASSESSMENT:   Admit w/ acute on chronic CHF, fluid overload, DM2, HTN. Came to ED after 2 wks increasing fatigue, edema, SOB.  7/08 Admitted 7/14 CABG x 1, Extubated post op 7/15 Worsening dyspnea with anuric AKI, POCUS ECHO with EF 25%, RV markedly dilated. Started on iNO, high dose lasix   Remains on Vasopressin  at 0.03, off epinephrine . Dobutamine  turned off this AM, milrinone  at 0.25. Weaning iNO. Lasix  drip off  Pt reports SOB is a little better  Recorded po intake 50-100% but 3 of the 4 meals were from prior to surgery. One meal documented 7/17 at 50%. Pt reports he has consumed 2 Nepro shakes since admission, does not really like and also only likes Chocolate flavor (Nepro does not come in Chocolate). Plan to change to Ensure, discussed with pt who is willing to try this   Noted nausea as been an issue, pt reports nausea has improved.  +Flatus today, but still no BM.  No BM in >1 week, noted Dulcolax and Colace started on 7/15, miralax  yesterday, received 1 time dose of sorbitol  yesterday as well  UOP 5.5 L in 24 hours.  Net negative 9-10 L since admission per I/O flowsheet  Current wt 60.5 kg; down from 68.5 kg on 7/15. Lowest wt this admission, 59.9 kg.   Labs: Sodium 131 (L) Potassium 3.5 (wdl) Phosphorus 3.4 (Wdl) Magnesium  2.4 (Wdl) BUN 48 Creatinine 2.76 CBGs 114-255  Meds:   Colace Dulcolax SS Novolog  Semglee  Miralax   Protonix  KCl  Diet Order:   Diet Order             Diet Heart Room service appropriate? Yes; Fluid consistency: Thin  Diet effective now                   EDUCATION NEEDS:   Education needs have been addressed  Skin:  Skin Assessment: Reviewed RN Assessment  Last BM:  7/10 medium type 7/3  Height:   Ht Readings from Last 1 Encounters:  09/12/23 5' 6 (1.676 m)    Weight:   Wt Readings from Last 1 Encounters:  09/16/23 60.5 kg    BMI:  Body mass index is 21.53 kg/m.  Estimated Nutritional Needs:   Kcal:  1900-2100 kcals  Protein:  80-90 g  Fluid:  1.8 L   Betsey Finger MS, RDN, LDN, CNSC Registered Dietitian 3 Clinical Nutrition RD Inpatient Contact Info in Amion

## 2023-09-16 NOTE — Evaluation (Signed)
 Physical Therapy Evaluation Patient Details Name: Joel Pitts MRN: [id number] DOB: [birth year] Today's Date: 09/16/2023  History of Present Illness  73 y/o male admitted 7/8 w/ chest pain and new systolic heart failure and LV thrombus. 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 Post op course c/b post cardiotomy shock and ATN. PMH:  DM2 and renal stones  Clinical Impression  Patient is s/p above surgery presenting with functional limitations due to the deficits listed below (see PT Problem List). Independent and active with yard work/gardening PTA. Lives alone but has a sister and several friends available to assist at home as needed. 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 Required mod assist to rise and scoot to EOB, mod assist to stand while holding heart pillow for comfort, and min assist for step pivot transfer to recliner. Demonstrates posterior lean which gradually improved with training. Progressed with pre-gait activity, took two steps forward and backwards with min assist. VSS throughout session on 10L HFNC with iNO. Reviewed sternal precautions, LE exercises. Patient will benefit from intensive inpatient follow-up therapy, >3 hours/day.  Patient will benefit from acute skilled PT to increase their independence and safety with mobility to facilitate discharge.         If plan is discharge home, recommend the following: A lot of help with walking and/or transfers;A lot of help with bathing/dressing/bathroom;Assistance with cooking/housework;Assist for transportation;Help with stairs or ramp for entrance   Can travel by private vehicle        Equipment Recommendations  (TBD pending progress)  Recommendations for Other Services  Rehab consult    Functional Status Assessment Patient has had a recent decline in their functional status and demonstrates the ability to make significant improvements in function in a reasonable and predictable amount of time.     Precautions / Restrictions  Precautions Precautions: Fall;Sternal Recall of Precautions/Restrictions: Impaired Precaution/Restrictions Comments: inhaled Nitric Oxide Restrictions Other Position/Activity Restrictions: Sternal Precautions      Mobility  Bed Mobility Overal bed mobility: Needs Assistance Bed Mobility: Supine to Sit     Supine to sit: Mod assist, HOB elevated     General bed mobility comments: Able to move LEs to edge of bed with cues for technique, mod assist for trunk support to rise from elevated position. Maintains sternal precautions    Transfers Overall transfer level: Needs assistance Equipment used: None Transfers: Sit to/from Stand, Bed to chair/wheelchair/BSC Sit to Stand: Mod assist   Step pivot transfers: Min assist       General transfer comment: Holding heart pillow for comfort. Mod assist for boost to stand, posterior lean,knees bracing against bed for support. Min assist for step pivot transfer to chair. Still with posterior bias.    Ambulation/Gait             Pre-gait activities: Weight shift, static march, two steps forward and backwards with min assist for balance, leaning posteriorly.    Stairs            Wheelchair Mobility     Tilt Bed    Modified Rankin (Stroke Patients Only)       Balance Overall balance assessment: Needs assistance Sitting-balance support: No upper extremity supported, Feet supported Sitting balance-Leahy Scale: Fair     Standing balance support: No upper extremity supported, During functional activity Standing balance-Leahy Scale: Poor Standing balance comment: Min-mod assist for balance holding heart pillow for comfort.  Pertinent Vitals/Pain Pain Assessment Pain Assessment: Faces Faces Pain Scale: Hurts little more Pain Location: sternum with exertion Pain Descriptors / Indicators: Operative site guarding Pain Intervention(s): Monitored during session, Utilized  relaxation techniques, Limited activity within patient's tolerance, Repositioned    Home Living Family/patient expects to be discharged to:: Private residence Living Arrangements: Alone Available Help at Discharge: Family;Friend(s);Available PRN/intermittently Type of Home: House Home Access: Stairs to enter Entrance Stairs-Rails: None Entrance Stairs-Number of Steps: 2   Home Layout: One level Home Equipment: Agricultural consultant (2 wheels);Hand held shower head;Grab bars - tub/shower (May have access to pivot shower bench)      Prior Function Prior Level of Function : Independent/Modified Independent;Driving             Mobility Comments: Independent, not using RW, Active in garden/lawn care ADLs Comments: ind, works outdoors in his yard.     Extremity/Trunk Assessment   Upper Extremity Assessment Upper Extremity Assessment: Defer to OT evaluation    Lower Extremity Assessment Lower Extremity Assessment: Generalized weakness       Communication   Communication Communication: No apparent difficulties    Cognition Arousal: Alert Behavior During Therapy: WFL for tasks assessed/performed   PT - Cognitive impairments: No apparent impairments                         Following commands: Intact       Cueing Cueing Techniques: Verbal cues, Gestural cues     General Comments General comments (skin integrity, edema, etc.): BP reclined in bed 117/69, standing BP 103/76 - asymptomatic. HFNC 10L with iNO attached. SpO2 100%. Educated on sternal precautions.    Exercises General Exercises - Lower Extremity Ankle Circles/Pumps: AROM, Both, 10 reps, Seated Quad Sets: Strengthening, Both, 10 reps, Seated Gluteal Sets: Strengthening, Both, 10 reps, Seated   Assessment/Plan    PT Assessment Patient needs continued PT services  PT Problem List Decreased strength;Decreased range of motion;Decreased activity tolerance;Decreased balance;Decreased mobility;Decreased  knowledge of use of DME;Decreased knowledge of precautions;Cardiopulmonary status limiting activity;Pain       PT Treatment Interventions DME instruction;Gait training;Stair training;Functional mobility training;Therapeutic activities;Therapeutic exercise;Balance training;Neuromuscular re-education;Patient/family education;Modalities    PT Goals (Current goals can be found in the Care Plan section)  Acute Rehab PT Goals Patient Stated Goal: Get well, return to gardening and yard work. PT Goal Formulation: With patient Time For Goal Achievement: 09/30/23 Potential to Achieve Goals: Good    Frequency Min 3X/week     Co-evaluation               AM-PAC PT 6 Clicks Mobility  Outcome Measure Help needed turning from your back to your side while in a flat bed without using bedrails?: A Little Help needed moving from lying on your back to sitting on the side of a flat bed without using bedrails?: A Lot Help needed moving to and from a bed to a chair (including a wheelchair)?: A Lot Help needed standing up from a chair using your arms (e.g., wheelchair or bedside chair)?: A Lot Help needed to walk in hospital room?: A Lot Help needed climbing 3-5 steps with a railing? : Total 6 Click Score: 12    End of Session Equipment Utilized During Treatment: Oxygen Activity Tolerance: Patient tolerated treatment well Patient left: in chair;with call bell/phone within reach;with nursing/sitter in room Nurse Communication: Mobility status PT Visit Diagnosis: Unsteadiness on feet (R26.81);Other abnormalities of gait and mobility (R26.89);Muscle weakness (generalized) (M62.81);Difficulty in walking,  not elsewhere classified (R26.2);Pain Pain - part of body:  (sternum)    Time: 8464-8440 PT Time Calculation (min) (ACUTE ONLY): 24 min   Charges:   PT Evaluation $PT Eval Moderate Complexity: 1 Mod PT Treatments $Therapeutic Activity: 8-22 mins PT General Charges $$ ACUTE PT VISIT: 1  Visit         Leontine Roads, PT, DPT [facility]  Re[facility] Services Physical Therapist Office: [phone] Website: [email or web address]   Leontine GORMAN Roads 09/16/2023, 4:25 PM

## 2023-09-16 NOTE — Progress Notes (Signed)
 PHARMACY - ANTICOAGULATION CONSULT NOTE  Pharmacy Consult for heparin  Indication: Echo showing apical clot  Allergies  Allergen Reactions   Statins Diarrhea    Patient Measurements: Height: 5' 6 (167.6 cm) Weight: 65.5 kg (144 lb 6.4 oz) IBW/kg (Calculated) : 63.8 HEPARIN  DW (KG): 60.9  Vital Signs: Temp: 98.1 F (36.7 C) (07/18 0545) Temp Source: Core (07/17 2015) BP: 123/71 (07/18 0545) Pulse Rate: 84 (07/18 0545)  Labs: Recent Labs    09/13/23 1446 09/13/23 1757 09/14/23 0759 09/14/23 1215 09/15/23 0439 09/15/23 1536 09/15/23 2016 09/16/23 0410 09/16/23 0531  HGB 13.9 14.3 13.9  --  13.8  --   --   --   --   HCT 41.8 42.0 41.3  --  40.4  --   --   --   --   PLT 106*  --  112*  --  97*  --   --   --   --   HEPARINUNFRC  --   --   --    < > <0.10*  --  0.12*  --  0.22*  CREATININE 2.29* 2.10* 2.95*   < > 3.02* 2.65*  --  2.76*  --    < > = values in this interval not displayed.    Estimated Creatinine Clearance: 21.2 mL/min (A) (by C-G formula based on SCr of 2.76 mg/dL (H)).   Assessment: 73 yo male with HF and apical clot on ECHO. Pharmacy consulted to dose IV heparin . Underwent cardiac cath finding severely disease LAD with weak collaterals - reviewed with interventional and now being evaluated by CTVS. Patient had off-pump CABG procedure on 09/12/23 complicated by post-operative shock state.   Heparin  level remains subtherapeutic (0.22) on infusion at 1250 units/hr. RN notes small amount of oozing from chest tube site but very minimal. Also heparin  off for a few minutes (5-35min) around 0230.   Goal of Therapy:  Heparin  level 0.3-0.7 units/mL while chest tubes are in place Monitor platelets by anticoagulation protocol: Yes   Plan:  Increase heparin  to 1400 units/hr F/u 8 hr heparin  level  Vito Ralph, PharmD, BCPS Please see amion for complete clinical pharmacist phone list 09/16/23 6:18 AM

## 2023-09-16 NOTE — Progress Notes (Signed)
      [address]       [place] [address]             [phone]                 4 Days Post-Op Procedure(s) (LRB): OFF PUMP CORONARY ARTERY BYPASS GRAFTING X 1, USING LEFT INTERNAL MAMMARY ARTERY (N/A)   Events: No events  _______________________________________________________________ Vitals: BP 107/73   Pulse 78   Temp 98.8 F (37.1 C)   Resp (!) 22   Ht 5' 6 (1.676 m)   Wt 60.5 kg   SpO2 99%   BMI 21.53 kg/m  Filed Weights   09/13/23 0500 09/15/23 0500 09/16/23 0500  Weight: 68.6 kg 65.5 kg 60.5 kg     - Neuro: alert NAD  - Cardiovascular: sinus  Drips: dob 5,  vaso 0.03 milr 0.25  PAP: (24-53)/(1-23) 26/1 CVP:  [0 mmHg-18 mmHg] 0 mmHg CO:  [2.9 L/min-4.1 L/min] 4.1 L/min CI:  [1.7 L/min/m2-2.42 L/min/m2] 2.42 L/min/m2  - Pulm: EWOB  ABG    Component Value Date/Time   PHART 7.524 (H) 09/16/2023 1231   PCO2ART 37.1 09/16/2023 1231   PO2ART 194 (H) 09/16/2023 1231   HCO3 30.6 (H) 09/16/2023 1231   TCO2 32 09/16/2023 1231   ACIDBASEDEF 6.0 (H) 09/12/2023 1606   O2SAT 68.5 09/16/2023 1440    - Abd: ND - Extremity: warm  .Intake/Output      07/17 0701 07/18 0700 07/18 0701 07/19 0700   [name]. 480    I.V. (mL/kg) 844.2 (14) 91.2 (1.5)   IV Piggyback 300    Total Intake(mL/kg) 1624.2 (26.8) 91.2 (1.5)   Urine (mL/kg/hr) 5520 (3.8) 225 (0.4)   Chest Tube     Total Output 5520 225   Net -3895.8 -133.8           _______________________________________________________________ Labs:    Latest Ref Rng & Units 09/16/2023    2:00 PM 09/16/2023   12:31 PM 09/15/2023    4:39 AM  CBC  WBC 4.0 - 10.5 K/uL 8.6   10.0   Hemoglobin 13.0 - 17.0 g/dL 85.4  86.0  86.1   Hematocrit 39.0 - 52.0 % 40.0  41.0  40.4   Platelets 150 - 400 K/uL 133   97       Latest Ref Rng & Units 09/16/2023   12:31 PM 09/16/2023    4:10 AM 09/15/2023    3:36 PM  CMP  Glucose 70 - 99 mg/dL  854  877   BUN 8 - 23 mg/dL  48  45   Creatinine 9.38 - 1.24  mg/dL  7.23  7.34   Sodium 864 - 145 mmol/L 127  131  130   Potassium 3.5 - 5.1 mmol/L 4.2  3.5  3.2   Chloride 98 - 111 mmol/L  88  89   CO2 22 - 32 mmol/L  27  26   Calcium 8.9 - 10.3 mg/dL  9.5  9.3     CXR: PV congestion  _______________________________________________________________  Assessment and Plan: POD 4 s/p CABG, CHF  Neuro: pain controlled.   CV: on chemical support, appreciate HF team. Weaning support today.   Pulm: IS Renal: creat trending down  GI: on diet Heme: stable ID: [email or web address] Endo: SSI Dispo: continue ICU care   Joel Pitts Joel Pitts Joel Pitts 09/16/2023 4:07 PM

## 2023-09-17 DIAGNOSIS — I5023 Acute on chronic systolic (congestive) heart failure: Secondary | ICD-10-CM | POA: Diagnosis not present

## 2023-09-17 LAB — HEPARIN LEVEL (UNFRACTIONATED): Heparin Unfractionated: 0.48 [IU]/mL (ref 0.30–0.70)

## 2023-09-17 LAB — CBC
HCT: 40.4 % (ref 39.0–52.0)
Hemoglobin: 14 g/dL (ref 13.0–17.0)
MCH: 32.1 pg (ref 26.0–34.0)
MCHC: 34.7 g/dL (ref 30.0–36.0)
MCV: 92.7 fL (ref 80.0–100.0)
Platelets: 136 K/uL — ABNORMAL LOW (ref 150–400)
RBC: 4.36 MIL/uL (ref 4.22–5.81)
RDW: 12.1 % (ref 11.5–15.5)
WBC: 7.4 K/uL (ref 4.0–10.5)
nRBC: 0 % (ref 0.0–0.2)

## 2023-09-17 LAB — BASIC METABOLIC PANEL WITH GFR
Anion gap: 11 (ref 5–15)
BUN: 50 mg/dL — ABNORMAL HIGH (ref 8–23)
CO2: 30 mmol/L (ref 22–32)
Calcium: 9.3 mg/dL (ref 8.9–10.3)
Chloride: 89 mmol/L — ABNORMAL LOW (ref 98–111)
Creatinine, Ser: 1.92 mg/dL — ABNORMAL HIGH (ref 0.61–1.24)
GFR, Estimated: 36 mL/min — ABNORMAL LOW (ref >60)
Glucose, Bld: 132 mg/dL — ABNORMAL HIGH (ref 70–99)
Potassium: 3.5 mmol/L (ref 3.5–5.1)
Sodium: 130 mmol/L — ABNORMAL LOW (ref 135–145)

## 2023-09-17 LAB — GLUCOSE, CAPILLARY
Glucose-Capillary: 134 mg/dL — ABNORMAL HIGH (ref 70–99)
Glucose-Capillary: 236 mg/dL — ABNORMAL HIGH (ref 70–99)
Glucose-Capillary: 195 mg/dL — ABNORMAL HIGH (ref 70–99)
Glucose-Capillary: 156 mg/dL — ABNORMAL HIGH (ref 70–99)
Glucose-Capillary: 163 mg/dL — ABNORMAL HIGH (ref 70–99)
Glucose-Capillary: 116 mg/dL — ABNORMAL HIGH (ref 70–99)

## 2023-09-17 LAB — COOXEMETRY PANEL
Carboxyhemoglobin: 1.7 % — ABNORMAL HIGH (ref 0.5–1.5)
Carboxyhemoglobin: 1.3 % (ref 0.5–1.5)
Methemoglobin: <0.7 % (ref 0.0–1.5)
Methemoglobin: <0.7 % (ref 0.0–1.5)
O2 Saturation: 73.4 %
O2 Saturation: 62.4 %
Total hemoglobin: 13.4 g/dL (ref 12.0–16.0)
Total hemoglobin: 16.2 g/dL — ABNORMAL HIGH (ref 12.0–16.0)

## 2023-09-17 MED ORDER — POTASSIUM CHLORIDE CRYS ER 20 MEQ PO TBCR
30.0000 meq | EXTENDED_RELEASE_TABLET | ORAL | Status: AC
  Administered 2023-09-17 (×2): 30 meq via ORAL
  Filled 2023-09-17 (×2): qty 1

## 2023-09-17 MED ORDER — APIXABAN 5 MG PO TABS
5.0000 mg | ORAL_TABLET | Freq: Two times a day (BID) | ORAL | Status: DC
  Administered 2023-09-17 – 2023-09-23 (×13): 5 mg via ORAL
  Filled 2023-09-17 (×13): qty 1

## 2023-09-17 NOTE — Progress Notes (Signed)

## 2023-09-17 NOTE — Progress Notes (Addendum)
 PHARMACY - ANTICOAGULATION CONSULT NOTE  Pharmacy Consult for heparin  Indication: Echo showing apical clot  Allergies  Allergen Reactions   Statins Diarrhea    Patient Measurements: Height: 5' 6 (167.6 cm) Weight: 60.5 kg (133 lb 6.1 oz) IBW/kg (Calculated) : 63.8 HEPARIN  DW (KG): 60.9  Vital Signs: Temp: 97.8 F (36.6 C) (07/19 0331) Temp Source: Oral (07/19 0331) BP: 123/69 (07/19 0600) Pulse Rate: 78 (07/19 0600)  Labs: Recent Labs    09/15/23 0439 09/15/23 1536 09/15/23 2016 09/16/23 0410 09/16/23 0531 09/16/23 1231 09/16/23 1400 09/17/23 0514  HGB 13.8  --   --   --   --  13.9 14.5 14.0  HCT 40.4  --   --   --   --  41.0 40.0 40.4  PLT 97*  --   --   --   --   --  133* 136*  HEPARINUNFRC <0.10*  --    < >  --  0.22*  --  0.28* 0.48  CREATININE 3.02* 2.65*  --  2.76*  --   --   --  1.92*   < > = values in this interval not displayed.    Estimated Creatinine Clearance: 28.9 mL/min (A) (by C-G formula based on SCr of 1.92 mg/dL (H)).   Assessment: 73 yo male with HF and apical clot on ECHO. Pharmacy consulted to dose IV heparin . Underwent cardiac cath finding severely disease LAD with weak collaterals - reviewed with interventional and now being evaluated by CTVS. Patient had off-pump CABG procedure on 09/12/23 complicated by post-operative shock state.   Heparin  level remains subtherapeutic (0.28) on infusion at 1400 units/hr. No overt bleeding or complications noted.  Platelet count trending back up.  7/19 AM: Heparin  level 0.48, therapeutic on heparin  1500 units/hr. Hgb stable at 14.0, PLT increased to 136. Discussed with TCTS MD, decision made to transition patient to Eliquis  today.   Goal of Therapy:  Heparin  level 0.3-0.7 units/mL Monitor platelets by anticoagulation protocol: Yes   Plan:  Stop heparin  infusion Start Eliquis  5 mg PO BID per Dr. Shyrl Continue aspirin  81 mg PO daily Continue to monitor CBC and s/sx of bleeding  Morna Breach, PharmD PGY2 Cardiology Pharmacy Resident 09/17/2023 6:53 AM  [facility] pharmacy phone numbers are listed on [email or web address]

## 2023-09-17 NOTE — Progress Notes (Signed)
 Advanced Heart Failure Rounding Note  Cardiologist: None  AHF Consulting MD: Chief Complaint: acute systolic heart failure, post cardiotomy shock  Patient Profile   73 y/o male admitted w/ chest pain and new systolic heart failure and LV thrombus. EF 20%, RV mildly reduced. Cath w/ 99% stenosis of LAD, now s/p CABG x 1. Post op course c/b post cardiotomy shock and ATN.    Subjective:     7/14: S/p off pump CABG x 1 LIMA-LAD. Extubated same day  7/15: worsening dyspnea w/ anuric AKI, required escalation of inotrope's. POCUS ECHO with EF 25% RV markedly dilated an severe HK. Septum flattened. Started on iNO and high dose lasix  gtt  POD#6  Removed swan yesterday, coox 73 this AM on vaso 0.01 and milrinone . Stopped vasopressin  wean milrinone  to 0.125. Holding further diuretics today, CVP 3. Would remove arterial line.  Coox this afternoon, if creatinine improved can start low dose ARB tomorrow. Feeling much better.   Objective:   Weight Range: 60.1 kg Body mass index is 21.39 kg/m.   Vital Signs:   Temp:  [97.7 F (36.5 C)-99.5 F (37.5 C)] 98.1 F (36.7 C) (07/19 1142) Pulse Rate:  [73-91] 84 (07/19 1100) Resp:  [12-30] 17 (07/19 1100) BP: (103-131)/(62-88) 115/65 (07/19 1100) SpO2:  [92 %-100 %] 97 % (07/19 1100) Arterial Line BP: (113-164)/(42-81) 148/58 (07/19 1000) Weight:  [60.1 kg] 60.1 kg (07/19 0500) Last BM Date :  (PTA)  Weight change: Filed Weights   09/15/23 0500 09/16/23 0500 09/17/23 0500  Weight: 65.5 kg 60.5 kg 60.1 kg    Intake/Output:   Intake/Output Summary (Last 24 hours) at 09/17/2023 1250 Last data filed at 09/17/2023 1200 Gross per 24 hour  Intake 587.19 ml  Output 1400 ml  Net -812.81 ml      Physical Exam   General:  sitting up in bed , NAD Cor: RRR, well healing sternal wound, JVP flat, CVP 3, no LE edema Lungs: normal WOB Abdomen: soft, nontender, nondistended.    Telemetry   NSR 80s  Labs    CBC Recent Labs     09/16/23 1400 09/17/23 0514  WBC 8.6 7.4  HGB 14.5 14.0  HCT 40.0 40.4  MCV 91.1 92.7  PLT 133* 136*   Basic Metabolic Panel Recent Labs    92/81/74 0410 09/16/23 1231 09/17/23 0514  NA 131* 127* 130*  K 3.5 4.2 3.5  CL 88*  --  89*  CO2 27  --  30  GLUCOSE 145*  --  132*  BUN 48*  --  50*  CREATININE 2.76*  --  1.92*  CALCIUM 9.5  --  9.3  MG 2.4  --   --   PHOS 3.4  --   --    Liver Function Tests Recent Labs    09/16/23 0410  ALBUMIN  3.9    Medications:     Scheduled Medications:  acetaminophen   1,000 mg Oral Q6H   Or   acetaminophen  (TYLENOL ) oral liquid 160 mg/5 mL  1,000 mg Per Tube Q6H   apixaban   5 mg Oral BID   aspirin  EC  81 mg Oral Daily   bisacodyl   10 mg Oral Daily   Or   bisacodyl   10 mg Rectal Daily   Chlorhexidine  Gluconate Cloth  6 each Topical Daily   docusate sodium   200 mg Oral Daily   feeding supplement  237 mL Oral BID BM   insulin  aspart  0-24 Units Subcutaneous Q4H  insulin  glargine-yfgn  5 Units Subcutaneous BID   pantoprazole   40 mg Oral Daily   polyethylene glycol  17 g Oral Daily   potassium chloride   30 mEq Oral Q4H   sodium chloride  flush  3 mL Intravenous Q12H   sodium chloride  flush  3-10 mL Intravenous Q12H   sodium chloride  flush  3-10 mL Intravenous Q12H   sodium chloride  flush  3-10 mL Intravenous Q12H   sodium chloride  flush  3-10 mL Intravenous Q12H   tamsulosin   0.4 mg Oral PC supper    Infusions:  dexmedetomidine  (PRECEDEX ) IV infusion Stopped (09/12/23 1446)   milrinone  0.125 mcg/kg/min (09/17/23 1200)    PRN Medications: dextrose , HYDROmorphone  (DILAUDID ) injection, ondansetron  (ZOFRAN ) IV, mouth rinse, mouth rinse, oxyCODONE , sodium chloride  flush, sodium chloride  flush, sodium chloride  flush, sodium chloride  flush, sodium chloride  flush, traMADol      Assessment/Plan   Acute systolic CHF w/ Post Cardiotomy Shock:  - Suspect late presentation of anterior STEMI, CMR indicated some viability - s/p  CABG x 1, LIMA-LAD c/b post-cardiotomy shock and ATN   - Required dual inotropes and high dose diuretics but now significantly improved, filling pressures normalized - Back to preop weight, hold lasix  again today, creatinine now 1.9 - Off iNO and DBA 7/18 - Wean milrinone  to 0.125, off vaso - Remove arterial line, PICC line in place - Potentially ARB tomorrow - Suspect will need CIR  CAD: Suspect missed LAD STEMI. S/p off pump CABG 7/14. EKG with large anterior q waves - stable w/o CP  - continue ASA + statin - Start eliquis  for LV thrombus - no ? blocker w/ low output   AKI : Suspect post operative ATN, improved with high dose diuretics, now normal filling pressures and improving - continue to support CO and MAP per above  - Off lasix  gtt, hold today as he is autodiuresing  Apical thrombus: Noted on contrast images and somewhat equivocal. Confirmed by cMRI, layered LV apical mural thrombus  - on heparin  gtt   Acute Hypoxic Respiratory Failure - on HHF [place].  - Should be able to wean per RT   Length of Stay: 11  CRITICAL CARE Performed by: Morene JINNY Brownie   Total critical care time: 32 minutes  Critical care time was exclusive of separately billable procedures and treating other patients.  Critical care was necessary to treat or prevent imminent or life-threatening deterioration.  Critical care was time spent personally by me on the following activities: development of treatment plan with patient and/or surrogate as well as nursing, discussions with consultants, evaluation of patient's response to treatment, examination of patient, obtaining history from patient or surrogate, ordering and performing treatments and interventions, ordering and review of laboratory studies, ordering and review of radiographic studies, pulse oximetry and re-evaluation of patient's condition.

## 2023-09-17 NOTE — Progress Notes (Signed)
 Pt taken off INO therapy per wean protocol. VSS at this time RT will follow up as needed.

## 2023-09-17 NOTE — Progress Notes (Signed)
      [address]       [place] [id number]             [phone]                 5 Days Post-Op Procedure(s) (LRB): OFF PUMP CORONARY ARTERY BYPASS GRAFTING X 1, USING LEFT INTERNAL MAMMARY ARTERY (N/A)   Events: No events  _______________________________________________________________ Vitals: BP 108/67 (BP Location: Right Arm)   Pulse 79   Temp 97.7 F (36.5 C) (Oral)   Resp 13   Ht 5' 6 (1.676 m)   Wt 60.1 kg   SpO2 94%   BMI 21.39 kg/m  Filed Weights   09/15/23 0500 09/16/23 0500 09/17/23 0500  Weight: 65.5 kg 60.5 kg 60.1 kg     - Neuro: alert NAD  - Cardiovascular: sinus  Drips:  vaso 0.03 milr 0.25  PAP: (26-49)/(1-19) 30/4 CVP:  [0 mmHg-22 mmHg] 0 mmHg CO:  [2.8 L/min-3.6 L/min] 2.9 L/min CI:  [1.65 L/min/m2-2.15 L/min/m2] 1.74 L/min/m2  - Pulm: EWOB  ABG    Component Value Date/Time   PHART 7.524 (H) 09/16/2023 1231   PCO2ART 37.1 09/16/2023 1231   PO2ART 194 (H) 09/16/2023 1231   HCO3 30.6 (H) 09/16/2023 1231   TCO2 32 09/16/2023 1231   ACIDBASEDEF 6.0 (H) 09/12/2023 1606   O2SAT 73.4 09/17/2023 0510    - Abd: ND - Extremity: warm  .Intake/Output      07/18 0701 07/19 0700 07/19 0701 07/20 0700   P.O.     I.V. (mL/kg) 663 (11) 23.1 (0.4)   IV Piggyback     Total Intake(mL/kg) 663 (11) 23.1 (0.4)   Urine (mL/kg/hr) 1725 (1.2)    Total Output 1725    Net -1062 +23.1        Urine Occurrence 2 x       _______________________________________________________________ Labs:    Latest Ref Rng & Units 09/17/2023    5:14 AM 09/16/2023    2:00 PM 09/16/2023   12:31 PM  CBC  WBC 4.0 - 10.5 K/uL 7.4  8.6    Hemoglobin 13.0 - 17.0 g/dL 85.9  85.4  86.0   Hematocrit 39.0 - 52.0 % 40.4  40.0  41.0   Platelets 150 - 400 K/uL 136  133        Latest Ref Rng & Units 09/17/2023    5:14 AM 09/16/2023   12:31 PM 09/16/2023    4:10 AM  CMP  Glucose 70 - 99 mg/dL 867   854   BUN 8 - 23 mg/dL 50   48   Creatinine 9.38 -  1.24 mg/dL 8.07   7.23   Sodium 864 - 145 mmol/L 130  127  131   Potassium 3.5 - 5.1 mmol/L 3.5  4.2  3.5   Chloride 98 - 111 mmol/L 89   88   CO2 22 - 32 mmol/L 30   27   Calcium 8.9 - 10.3 mg/dL 9.3   9.5     CXR: PV congestion  _______________________________________________________________  Assessment and Plan: POD 5 s/p CABG, CHF  Neuro: pain controlled.   CV: on chemical support, appreciate HF team. Weaning support today.   Pulm: IS Renal: creat trending down  GI: on diet Heme: stable ID: [email or web address] Endo: SSI Dispo: continue ICU care   Joel Pitts 09/17/2023 9:07 AM

## 2023-09-18 DIAGNOSIS — I5023 Acute on chronic systolic (congestive) heart failure: Secondary | ICD-10-CM | POA: Diagnosis not present

## 2023-09-18 LAB — COOXEMETRY PANEL
Carboxyhemoglobin: 1.5 % (ref 0.5–1.5)
Carboxyhemoglobin: 1.6 % — ABNORMAL HIGH (ref 0.5–1.5)
Methemoglobin: 0.8 % (ref 0.0–1.5)
Methemoglobin: <0.7 % (ref 0.0–1.5)
O2 Saturation: 69.2 %
O2 Saturation: 63.8 %
Total hemoglobin: 17 g/dL — ABNORMAL HIGH (ref 12.0–16.0)
Total hemoglobin: 17.4 g/dL — ABNORMAL HIGH (ref 12.0–16.0)

## 2023-09-18 LAB — BASIC METABOLIC PANEL WITH GFR
Anion gap: 13 (ref 5–15)
BUN: 42 mg/dL — ABNORMAL HIGH (ref 8–23)
CO2: 26 mmol/L (ref 22–32)
Calcium: 9.4 mg/dL (ref 8.9–10.3)
Chloride: 91 mmol/L — ABNORMAL LOW (ref 98–111)
Creatinine, Ser: 1.32 mg/dL — ABNORMAL HIGH (ref 0.61–1.24)
GFR, Estimated: 57 mL/min — ABNORMAL LOW (ref >60)
Glucose, Bld: 154 mg/dL — ABNORMAL HIGH (ref 70–99)
Potassium: 4.3 mmol/L (ref 3.5–5.1)
Sodium: 130 mmol/L — ABNORMAL LOW (ref 135–145)

## 2023-09-18 LAB — CBC
HCT: 47.1 % (ref 39.0–52.0)
Hemoglobin: 16.6 g/dL (ref 13.0–17.0)
MCH: 32.5 pg (ref 26.0–34.0)
MCHC: 35.2 g/dL (ref 30.0–36.0)
MCV: 92.2 fL (ref 80.0–100.0)
Platelets: 157 K/uL (ref 150–400)
RBC: 5.11 MIL/uL (ref 4.22–5.81)
RDW: 12.2 % (ref 11.5–15.5)
WBC: 7.6 K/uL (ref 4.0–10.5)
nRBC: 0 % (ref 0.0–0.2)

## 2023-09-18 LAB — GLUCOSE, CAPILLARY
Glucose-Capillary: 137 mg/dL — ABNORMAL HIGH (ref 70–99)
Glucose-Capillary: 138 mg/dL — ABNORMAL HIGH (ref 70–99)
Glucose-Capillary: 146 mg/dL — ABNORMAL HIGH (ref 70–99)
Glucose-Capillary: 147 mg/dL — ABNORMAL HIGH (ref 70–99)
Glucose-Capillary: 237 mg/dL — ABNORMAL HIGH (ref 70–99)
Glucose-Capillary: 248 mg/dL — ABNORMAL HIGH (ref 70–99)

## 2023-09-18 MED ORDER — INSULIN GLARGINE-YFGN 100 UNIT/ML ~~LOC~~ SOLN
10.0000 [IU] | Freq: Two times a day (BID) | SUBCUTANEOUS | Status: DC
  Administered 2023-09-18 – 2023-09-20 (×4): 10 [IU] via SUBCUTANEOUS
  Filled 2023-09-18 (×5): qty 0.1

## 2023-09-18 MED ORDER — POLYETHYLENE GLYCOL 3350 17 G PO PACK
17.0000 g | PACK | Freq: Every day | ORAL | Status: DC | PRN
  Administered 2023-09-19: 17 g via ORAL
  Filled 2023-09-18: qty 1

## 2023-09-18 MED ORDER — LOSARTAN POTASSIUM 25 MG PO TABS
12.5000 mg | ORAL_TABLET | Freq: Every day | ORAL | Status: DC
  Administered 2023-09-18: 12.5 mg via ORAL
  Filled 2023-09-18: qty 1

## 2023-09-18 MED ORDER — MINERAL OIL RE ENEM
1.0000 | ENEMA | Freq: Once | RECTAL | Status: AC
  Administered 2023-09-18: 1 via RECTAL
  Filled 2023-09-18: qty 1

## 2023-09-18 MED ORDER — LOSARTAN POTASSIUM 25 MG PO TABS: 25.0000 mg | ORAL_TABLET | Freq: Every day | ORAL | Status: DC

## 2023-09-18 MED ORDER — SORBITOL 70 % SOLN
30.0000 mL | Freq: Once | Status: AC
  Administered 2023-09-18: 30 mL via ORAL
  Filled 2023-09-18: qty 30

## 2023-09-18 NOTE — Plan of Care (Signed)
  Problem: Education: Goal: Knowledge of General Education information will improve Description: Including pain rating scale, medication(s)/side effects and non-pharmacologic comfort measures Outcome: Progressing   Problem: Health Behavior/Discharge Planning: Goal: Ability to manage health-related needs will improve Outcome: Progressing   Problem: Clinical Measurements: Goal: Ability to maintain clinical measurements within normal limits will improve Outcome: Progressing Goal: Will remain free from infection Outcome: Progressing Goal: Diagnostic test results will improve Outcome: Progressing Goal: Respiratory complications will improve Outcome: Progressing Goal: Cardiovascular complication will be avoided Outcome: Progressing   Problem: Activity: Goal: Risk for activity intolerance will decrease Outcome: Progressing   Problem: Nutrition: Goal: Adequate nutrition will be maintained Outcome: Progressing   Problem: Coping: Goal: Level of anxiety will decrease Outcome: Progressing   Problem: Elimination: Goal: Will not experience complications related to bowel motility Outcome: Progressing Goal: Will not experience complications related to urinary retention Outcome: Progressing   Problem: Pain Managment: Goal: General experience of comfort will improve and/or be controlled Outcome: Progressing   Problem: Safety: Goal: Ability to remain free from injury will improve Outcome: Progressing   Problem: Skin Integrity: Goal: Risk for impaired skin integrity will decrease Outcome: Progressing   Problem: Education: Goal: Understanding of CV disease, CV risk reduction, and recovery process will improve Outcome: Progressing Goal: Individualized Educational Video(s) Outcome: Progressing   Problem: Activity: Goal: Ability to return to baseline activity level will improve Outcome: Progressing   Problem: Cardiovascular: Goal: Ability to achieve and maintain adequate  cardiovascular perfusion will improve Outcome: Progressing Goal: Vascular access site(s) Level 0-1 will be maintained Outcome: Progressing   Problem: Health Behavior/Discharge Planning: Goal: Ability to safely manage health-related needs after discharge will improve Outcome: Progressing   Problem: Education: Goal: Will demonstrate proper wound care and an understanding of methods to prevent future damage Outcome: Progressing Goal: Knowledge of disease or condition will improve Outcome: Progressing Goal: Knowledge of the prescribed therapeutic regimen will improve Outcome: Progressing Goal: Individualized Educational Video(s) Outcome: Progressing   Problem: Activity: Goal: Risk for activity intolerance will decrease Outcome: Progressing   Problem: Cardiac: Goal: Will achieve and/or maintain hemodynamic stability Outcome: Progressing   Problem: Clinical Measurements: Goal: Postoperative complications will be avoided or minimized Outcome: Progressing   Problem: Respiratory: Goal: Respiratory status will improve Outcome: Progressing   Problem: Skin Integrity: Goal: Wound healing without signs and symptoms of infection Outcome: Progressing Goal: Risk for impaired skin integrity will decrease Outcome: Progressing   Problem: Urinary Elimination: Goal: Ability to achieve and maintain adequate renal perfusion and functioning will improve Outcome: Progressing

## 2023-09-18 NOTE — Progress Notes (Signed)
 Advanced Heart Failure Rounding Note  Cardiologist: None  AHF Consulting MD: Chief Complaint: acute systolic heart failure, post cardiotomy shock  Patient Profile   73 y/o male admitted w/ chest pain and new systolic heart failure and LV thrombus. EF 20%, RV mildly reduced. Cath w/ 99% stenosis of LAD, now s/p CABG x 1. Post op course c/b post cardiotomy shock and ATN.    Subjective:     7/14: S/p off pump CABG x 1 LIMA-LAD. Extubated same day  7/15: worsening dyspnea w/ anuric AKI, required escalation of inotrope's. POCUS ECHO with EF 25% RV markedly dilated an severe HK. Septum flattened. Started on iNO and high dose lasix  gtt as well as dual inotropes  POD#7  Continues to improve, looks like a new person compared to earlier in the week. Creatinine continues to improve, coox 69, stop milrinone  this morning. Hopefully to 4E soon. Has been ambulating.   Objective:   Weight Range: 60.1 kg Body mass index is 21.39 kg/m.   Vital Signs:   Temp:  [97.7 F (36.5 C)-98.1 F (36.7 C)] 97.8 F (36.6 C) (07/20 1119) Pulse Rate:  [81-100] 93 (07/20 1200) Resp:  [12-23] 16 (07/20 1200) BP: (101-134)/(64-90) 116/73 (07/20 1200) SpO2:  [89 %-99 %] 91 % (07/20 1200) Arterial Line BP: (103-145)/(47-92) 128/58 (07/20 1000) Weight:  [60.1 kg] 60.1 kg (07/20 0500) Last BM Date :  (PTA)  Weight change: Filed Weights   09/16/23 0500 09/17/23 0500 09/18/23 0500  Weight: 60.5 kg 60.1 kg 60.1 kg    Intake/Output:   Intake/Output Summary (Last 24 hours) at 09/18/2023 1412 Last data filed at 09/18/2023 1000 Gross per 24 hour  Intake 51.18 ml  Output 875 ml  Net -823.82 ml      Physical Exam   General:  sitting up in chair, well appearing , NAD Cor: RRR, well healing sternal wound, JVP flat, no LE edema Lungs: normal WOB Abdomen: soft, nontender, nondistended.    Telemetry   NSR 80s  Labs    CBC Recent Labs    09/17/23 0514 09/18/23 0413  WBC 7.4 7.6  HGB 14.0 16.6   HCT 40.4 47.1  MCV 92.7 92.2  PLT 136* 157   Basic Metabolic Panel Recent Labs    92/81/74 0410 09/16/23 1231 09/17/23 0514 09/18/23 0413  NA 131*   < > 130* 130*  K 3.5   < > 3.5 4.3  CL 88*  --  89* 91*  CO2 27  --  30 26  GLUCOSE 145*  --  132* 154*  BUN 48*  --  50* 42*  CREATININE 2.76*  --  1.92* 1.32*  CALCIUM 9.5  --  9.3 9.4  MG 2.4  --   --   --   PHOS 3.4  --   --   --    < > = values in this interval not displayed.   Liver Function Tests Recent Labs    09/16/23 0410  ALBUMIN  3.9    Medications:     Scheduled Medications:  apixaban   5 mg Oral BID   aspirin  EC  81 mg Oral Daily   bisacodyl   10 mg Oral Daily   Or   bisacodyl   10 mg Rectal Daily   Chlorhexidine  Gluconate Cloth  6 each Topical Daily   docusate sodium   200 mg Oral Daily   feeding supplement  237 mL Oral BID BM   insulin  aspart  0-24 Units Subcutaneous Q4H   insulin   glargine-yfgn  5 Units Subcutaneous BID   pantoprazole   40 mg Oral Daily   polyethylene glycol  17 g Oral Daily   sodium chloride  flush  3 mL Intravenous Q12H   sodium chloride  flush  3-10 mL Intravenous Q12H   sodium chloride  flush  3-10 mL Intravenous Q12H   sodium chloride  flush  3-10 mL Intravenous Q12H   sodium chloride  flush  3-10 mL Intravenous Q12H   tamsulosin   0.4 mg Oral PC supper    Infusions:    PRN Medications: dextrose , HYDROmorphone  (DILAUDID ) injection, ondansetron  (ZOFRAN ) IV, mouth rinse, mouth rinse, oxyCODONE , sodium chloride  flush, sodium chloride  flush, sodium chloride  flush, sodium chloride  flush, sodium chloride  flush, traMADol      Assessment/Plan   Acute systolic CHF w/ Post Cardiotomy Shock:  - Suspect late presentation of anterior STEMI, CMR indicated some viability - s/p CABG x 1, LIMA-LAD c/b post-cardiotomy shock and ATN   - Required dual inotropes and high dose diuretics but now significantly improved, filling pressures normalized - Creatinine trending down, start losartan  12.5mg   daily - Stop milrinone , coox at 1600 - Off iNO and DBA 7/18 - Remove arterial line - 4E soon  CAD: Suspect missed LAD STEMI. S/p off pump CABG 7/14. EKG with large anterior q waves - stable w/o CP  - continue ASA + statin - Continue eliquis  for LV thrombus - no ? blocker w/ low output   AKI : Suspect post operative ATN, improved with high dose diuretics, now normal filling pressures and improving - Potentially SGLT2 tomorrow, spironolactone  as well - No need for diuresis today  Apical thrombus: Noted on contrast images and somewhat equivocal. Confirmed by cMRI, layered LV apical mural thrombus  - On eliquis   Acute Hypoxic Respiratory Failure - Now weaned to 4L   Length of Stay: 12

## 2023-09-19 ENCOUNTER — Other Ambulatory Visit (HOSPITAL_COMMUNITY): Payer: Self-pay

## 2023-09-19 ENCOUNTER — Inpatient Hospital Stay (HOSPITAL_COMMUNITY)

## 2023-09-19 DIAGNOSIS — I5023 Acute on chronic systolic (congestive) heart failure: Secondary | ICD-10-CM | POA: Diagnosis not present

## 2023-09-19 LAB — CBC
HCT: 48.1 % (ref 39.0–52.0)
Hemoglobin: 16.6 g/dL (ref 13.0–17.0)
MCH: 32.1 pg (ref 26.0–34.0)
MCHC: 34.5 g/dL (ref 30.0–36.0)
MCV: 93 fL (ref 80.0–100.0)
Platelets: 191 K/uL (ref 150–400)
RBC: 5.17 MIL/uL (ref 4.22–5.81)
RDW: 12.2 % (ref 11.5–15.5)
WBC: 10.6 K/uL — ABNORMAL HIGH (ref 4.0–10.5)
nRBC: 0 % (ref 0.0–0.2)

## 2023-09-19 LAB — COOXEMETRY PANEL
Carboxyhemoglobin: 1.7 % — ABNORMAL HIGH (ref 0.5–1.5)
Methemoglobin: 0.9 % (ref 0.0–1.5)
O2 Saturation: 75.6 %
Total hemoglobin: 17.5 g/dL — ABNORMAL HIGH (ref 12.0–16.0)

## 2023-09-19 LAB — ECHOCARDIOGRAM LIMITED
Calc EF: 30.4 %
Height: 66 in
Single Plane A2C EF: 28.9 %
Single Plane A4C EF: 32.7 %
Weight: 2158.74 [oz_av]

## 2023-09-19 LAB — BASIC METABOLIC PANEL WITH GFR
Anion gap: 11 (ref 5–15)
BUN: 34 mg/dL — ABNORMAL HIGH (ref 8–23)
CO2: 29 mmol/L (ref 22–32)
Calcium: 9.4 mg/dL (ref 8.9–10.3)
Chloride: 92 mmol/L — ABNORMAL LOW (ref 98–111)
Creatinine, Ser: 1.1 mg/dL (ref 0.61–1.24)
GFR, Estimated: >60 mL/min (ref >60)
Glucose, Bld: 144 mg/dL — ABNORMAL HIGH (ref 70–99)
Potassium: 4 mmol/L (ref 3.5–5.1)
Sodium: 132 mmol/L — ABNORMAL LOW (ref 135–145)

## 2023-09-19 LAB — GLUCOSE, CAPILLARY
Glucose-Capillary: 115 mg/dL — ABNORMAL HIGH (ref 70–99)
Glucose-Capillary: 211 mg/dL — ABNORMAL HIGH (ref 70–99)
Glucose-Capillary: 198 mg/dL — ABNORMAL HIGH (ref 70–99)
Glucose-Capillary: 139 mg/dL — ABNORMAL HIGH (ref 70–99)
Glucose-Capillary: 184 mg/dL — ABNORMAL HIGH (ref 70–99)
Glucose-Capillary: 238 mg/dL — ABNORMAL HIGH (ref 70–99)

## 2023-09-19 MED ORDER — EMPAGLIFLOZIN 10 MG PO TABS
10.0000 mg | ORAL_TABLET | Freq: Every day | ORAL | Status: DC
  Administered 2023-09-19 – 2023-09-20 (×2): 10 mg via ORAL
  Filled 2023-09-19 (×2): qty 1

## 2023-09-19 MED ORDER — SORBITOL 70 % SOLN
30.0000 mL | Freq: Once | Status: AC
  Administered 2023-09-19: 30 mL via ORAL
  Filled 2023-09-19: qty 30

## 2023-09-19 MED ORDER — LACTULOSE 10 GM/15ML PO SOLN
20.0000 g | Freq: Once | ORAL | Status: AC
  Administered 2023-09-19: 20 g via ORAL
  Filled 2023-09-19: qty 30

## 2023-09-19 MED ORDER — SALINE SPRAY 0.65 % NA SOLN
1.0000 | NASAL | Status: DC | PRN
  Administered 2023-09-19: 1 via NASAL
  Filled 2023-09-19: qty 44

## 2023-09-19 MED ORDER — SMOG ENEMA: 960.0000 mL | Freq: Once | RECTAL | Status: DC | PRN

## 2023-09-19 MED ORDER — SPIRONOLACTONE 12.5 MG HALF TABLET
12.5000 mg | ORAL_TABLET | Freq: Every day | ORAL | Status: DC
  Administered 2023-09-19 – 2023-09-20 (×2): 12.5 mg via ORAL
  Filled 2023-09-19 (×2): qty 1

## 2023-09-19 MED ORDER — LOSARTAN POTASSIUM 25 MG PO TABS
25.0000 mg | ORAL_TABLET | Freq: Every day | ORAL | Status: DC
  Administered 2023-09-19 – 2023-09-23 (×5): 25 mg via ORAL
  Filled 2023-09-19 (×5): qty 1

## 2023-09-19 NOTE — Progress Notes (Signed)
 Echocardiogram 2D Echocardiogram has been performed.  Joel Pitts [name] RDCS 09/19/2023, 12:17 PM

## 2023-09-19 NOTE — Progress Notes (Addendum)
 Advanced Heart Failure Rounding Note  Cardiologist: None  AHF MD: Dr. Rolan Chief Complaint: acute systolic heart failure, post cardiotomy shock  Patient Profile   73 y/o male admitted w/ chest pain and new systolic heart failure and LV thrombus. EF 20%, RV mildly reduced. Cath w/ 99% stenosis of LAD, now s/p CABG x 1. Post op course c/b post cardiotomy shock and ATN.    Subjective:     7/14: S/p off pump CABG x 1 LIMA-LAD. Extubated same day  7/15: worsening dyspnea w/ anuric AKI, required escalation of inotrope's. POCUS ECHO with EF 25% RV markedly dilated an severe HK. Septum flattened. Started on iNO, lasix  gtt, and dual inotropes 7/18: Off iNO + DBA. PAC pulled.  7/20: Off milrinone   POD#8  Coox 76%. CVP 2. Off milrinone . Weight up 2lbs. Ambulating with PT. Biggest complaint is constipation.  Objective:    Weight Range: 61.2 kg Body mass index is 21.78 kg/m.   Vital Signs:   Temp:  [97.7 F (36.5 C)-98.1 F (36.7 C)] 98.1 F (36.7 C) (07/21 0317) Pulse Rate:  [87-100] 88 (07/21 0500) Resp:  [15-23] 15 (07/21 0500) BP: (104-128)/(64-90) 109/66 (07/21 0500) SpO2:  [91 %-97 %] 97 % (07/21 0500) Arterial Line BP: (128-145)/(58-65) 128/58 (07/20 1000) Weight:  [61.2 kg] 61.2 kg (07/21 0500) Last BM Date : 09/18/23  Weight change: Filed Weights   09/17/23 0500 09/18/23 0500 09/19/23 0500  Weight: 60.1 kg 60.1 kg 61.2 kg   Intake/Output:  Intake/Output Summary (Last 24 hours) at 09/19/2023 0742 Last data filed at 09/19/2023 0530 Gross per 24 hour  Intake 7.51 ml  Output 1450 ml  Net -1442.49 ml    Physical Exam   General: Elderly appearing. No distress on [place] Cardiac: JVP flat. S1 and S2 present. No murmurs or rub. Abdomen: Taut, non-tender, non-distended.  Extremities: Warm and dry.  No peripheral edema.  Neuro: Alert and oriented x3. Affect pleasant.  Telemetry   SR in 80s (personally reviewed)  Labs    CBC Recent Labs    09/18/23 0413  09/19/23 0504  WBC 7.6 10.6*  HGB 16.6 16.6  HCT 47.1 48.1  MCV 92.2 93.0  PLT 157 191   Basic Metabolic Panel Recent Labs    92/79/74 0413 09/19/23 0504  NA 130* 132*  K 4.3 4.0  CL 91* 92*  CO2 26 29  GLUCOSE 154* 144*  BUN 42* 34*  CREATININE 1.32* 1.10  CALCIUM 9.4 9.4   Medications:    Scheduled Medications:  apixaban   5 mg Oral BID   aspirin  EC  81 mg Oral Daily   bisacodyl   10 mg Oral Daily   Or   bisacodyl   10 mg Rectal Daily   Chlorhexidine  Gluconate Cloth  6 each Topical Daily   docusate sodium   200 mg Oral Daily   feeding supplement  237 mL Oral BID BM   insulin  aspart  0-24 Units Subcutaneous Q4H   insulin  glargine-yfgn  10 Units Subcutaneous BID   losartan   12.5 mg Oral Daily   pantoprazole   40 mg Oral Daily   polyethylene glycol  17 g Oral Daily   sodium chloride  flush  3 mL Intravenous Q12H   sodium chloride  flush  3-10 mL Intravenous Q12H   sodium chloride  flush  3-10 mL Intravenous Q12H   sodium chloride  flush  3-10 mL Intravenous Q12H   sodium chloride  flush  3-10 mL Intravenous Q12H   sorbitol   30 mL Oral Once  tamsulosin   0.4 mg Oral PC supper   Infusions:  PRN Medications: dextrose , HYDROmorphone  (DILAUDID ) injection, ondansetron  (ZOFRAN ) IV, mouth rinse, mouth rinse, oxyCODONE , polyethylene glycol, sodium chloride  flush, sodium chloride  flush, sodium chloride  flush, sodium chloride  flush, sodium chloride  flush, traMADol   Assessment/Plan   Acute systolic CHF w/ Post Cardiotomy Shock:  - Suspect late presentation of anterior STEMI, CMR indicated some viability - s/p CABG x 1, LIMA-LAD c/b post-cardiotomy shock and ATN   - Required dual inotropes and high dose diuretics but now significantly improved. Off iNO and DBA 7/18. Off milrinone  7/20. Coox stable.  - AKI improved; start GDMT - increase losartan  to 25 mg daily - add spiro 12.5 mg daily - add jardiance  10 mg daily. Has co-pay card.  - repeat echo today  CAD: Suspect missed LAD  STEMI. S/p off pump CABG 7/14. EKG with large anterior q waves - stable w/o CP  - continue ASA + statin - Continue eliquis  5 mg bid for LV thrombus - no ? blocker w/ low output   AKI : Suspect post operative ATN, improved with high dose diuretics. No resolved.  - add GDMT as above  Apical thrombus: Noted on contrast images and somewhat equivocal. Confirmed by cMRI, layered LV apical mural thrombus  - continue eliquis  5 mg bid  Acute Hypoxic Respiratory Failure - On 2L [place], wean as able  Constipation. - per pt last BM was last Tuesday - obtain KUB - continue scheduled bowel reg - add colace - SMOG this afternoon after KUB  Okay to transfer to 4E today  Length of Stay: 43  [place] Sherley Mckenney, NP 09/19/23, 09/19/23  Advanced Heart Failure Team Pager [phone] (M-F; 7a - 5p)  Please contact  Cardiology for night-coverage after hours (4p -7a ) and weekends on [email or web address]

## 2023-09-19 NOTE — Progress Notes (Signed)
 Patient ID: Joel Pitts, male   DOB: [birth year], 73 y.o.   MRN: [id number]  TCTS Evening Rounds:  Hemodynamically stable.   Had BM after sorbitol  and feels much better.  UO good.  Awaiting bed on 4E.

## 2023-09-19 NOTE — TOC Progression Note (Addendum)
 Transition of Care [facility]) - Progression Note    Patient Details  Name: ESTUARDO FRISBEE MRN: [id number] Date of Birth: [birth year]  Transition of Care [facility]) CM/SW Contact  Justina Delcia Czar, RN Phone Number: [phone] 09/19/2023, 4:33 PM  Clinical Narrative:    Spoke to pt and agreeable to IP rehab. States he needs to get stronger before going home. Pt wants to go to IP rehab. [facility] following for [facility]. Pt was preoperatively arranged pta.   CIR is following for IP rehab.    Expected Discharge Plan: Memory Care Barriers to Discharge: Continued Medical Work up  Expected Discharge Plan and Services In-house Referral: NA Discharge Planning Services: CM Consult Post Acute Care Choice: IP Rehab Living arrangements for the past 2 months: Single Family Home                   DME Agency: NA                   Social Determinants of Health (SDOH) Interventions SDOH Screenings   Food Insecurity: No Food Insecurity (09/07/2023)  Housing: Low Risk  (09/07/2023)  Transportation Needs: No Transportation Needs (09/07/2023)  Utilities: Not At Risk (09/07/2023)  Social Connections: Unknown (09/07/2023)  Tobacco Use: Low Risk  (09/12/2023)    Readmission Risk Interventions     No data to display

## 2023-09-19 NOTE — Progress Notes (Signed)
 Physical Therapy Treatment Patient Details Name: Joel Pitts MRN: [id number] DOB: 12-21-1950 Today's Date: 09/19/2023   History of Present Illness 72 yo male admitted 09/06/23 w/ chest pain, new systolic heart failure and LV thrombus. 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 Post op course complicated by post cardiotomy shock and ATN. PMHx: T2DM, BPH, HTN    PT Comments  Pt pleasant, concerned with constipation and progressing well with mobility. Pt lives alone and states sister is frequently out of town and cannot assist. Pt educated for sternal precautions, unable to recall any on arrival and stating 1/4 end of session. Pt with significant improvement in gait and mobility but continues to require physical assist and cues. Pt educated for HEP and progression with encouragement to be OOB during day. Will continue to follow. Patient will benefit from intensive inpatient follow-up therapy, >3 hours/day  HR 99-105 SPO2 90-95% on RA  Pre gait 118/68 (82) Post gait 129/83 (98)   If plan is discharge home, recommend the following: Assistance with cooking/housework;Assist for transportation;Help with stairs or ramp for entrance;A little help with walking and/or transfers;A little help with bathing/dressing/bathroom   Can travel by private vehicle        Equipment Recommendations  Rolling walker (2 wheels);BSC/3in1    Recommendations for Other Services       Precautions / Restrictions Precautions Precautions: Fall;Sternal Recall of Precautions/Restrictions: Impaired Precaution/Restrictions Comments: educated for precautions     Mobility  Bed Mobility Overal bed mobility: Needs Assistance Bed Mobility: Rolling, Sidelying to Sit, Sit to Supine Rolling: Min assist Sidelying to sit: Min assist   Sit to supine: Min assist   General bed mobility comments: min assist to roll, rise trunk from surface and return legs to surface end of session. cues for sequence and precautions    Transfers Overall transfer  level: Needs assistance   Transfers: Sit to/from Stand Sit to Stand: Contact guard assist           General transfer comment: CGA to rise from bed and toilet. Pt able to perform 6 repeated sit to stands from EOB and x 2 from toilet with cues for hand placement    Ambulation/Gait Ambulation/Gait assistance: Contact guard assist Gait Distance (Feet): 400 Feet Assistive device: Rolling walker (2 wheels) Gait Pattern/deviations: Step-through pattern, Decreased stride length       General Gait Details: cues for posture and breathing technique with pt maintaining >90% on RA with max HR 105   Stairs             Wheelchair Mobility     Tilt Bed    Modified Rankin (Stroke Patients Only)       Balance Overall balance assessment: Mild deficits observed, not formally tested                                          Communication Communication Communication: No apparent difficulties  Cognition Arousal: Alert Behavior During Therapy: WFL for tasks assessed/performed   PT - Cognitive impairments: No apparent impairments                         Following commands: Intact      Cueing Cueing Techniques: Verbal cues  Exercises General Exercises - Lower Extremity Long Arc Quad: AROM, Both, 15 reps, Seated Hip Flexion/Marching: AROM, Both, 15 reps, Seated    General Comments  Pertinent Vitals/Pain Pain Assessment Faces Pain Scale: Hurts little more Pain Location: sacrum due to constipation Pain Intervention(s): Limited activity within patient's tolerance, Monitored during session, Repositioned    Home Living                          Prior Function            PT Goals (current goals can now be found in the care plan section) Progress towards PT goals: Progressing toward goals    Frequency    Min 2X/week      PT Plan      Co-evaluation              AM-PAC PT 6 Clicks Mobility   Outcome  Measure  Help needed turning from your back to your side while in a flat bed without using bedrails?: A Little Help needed moving from lying on your back to sitting on the side of a flat bed without using bedrails?: A Little Help needed moving to and from a bed to a chair (including a wheelchair)?: A Little Help needed standing up from a chair using your arms (e.g., wheelchair or bedside chair)?: A Little Help needed to walk in hospital room?: A Little Help needed climbing 3-5 steps with a railing? : A Lot 6 Click Score: 17    End of Session   Activity Tolerance: Patient tolerated treatment well Patient left: in bed;with call bell/phone within reach (returned to bed for Echo) Nurse Communication: Mobility status PT Visit Diagnosis: Other abnormalities of gait and mobility (R26.89);Muscle weakness (generalized) (M62.81);Difficulty in walking, not elsewhere classified (R26.2)     Time: 9063-8989 PT Time Calculation (min) (ACUTE ONLY): 34 min  Charges:    $Gait Training: 8-22 mins $Therapeutic Activity: 8-22 mins PT General Charges $$ ACUTE PT VISIT: 1 Visit                     Lenoard SQUIBB, PT Acute Rehabilitation Services Office: [phone]    Lenoard NOVAK Alaja Goldinger 09/19/2023, 10:36 AM

## 2023-09-19 NOTE — Procedures (Signed)
 Inpatient Rehab Admissions Coordinator:    I spoke with Pt. Regarding potential CIR admit. He is interested and states that he lives alone. I think mod I goals are achievable for him. I will send case to insurance once OT note is in.  Leita Kleine, MS, CCC-SLP Rehab Admissions Coordinator  [phone] (celll) [phone] (office)

## 2023-09-19 NOTE — Evaluation (Signed)
 Occupational Therapy Evaluation Patient Details Name: Joel Pitts MRN: [id number] DOB: 1950/09/28 Today's Date: 09/19/2023   History of Present Illness   73 yo male admitted 09/06/23 w/ chest pain, new systolic heart failure and LV thrombus. 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 Post op course complicated by post cardiotomy shock and ATN. PMHx: T2DM, BPH, HTN     Clinical Impressions Patient is s/p CABG surgery resulting in functional limitations due to the deficits listed below (see OT problem list). Pt with ability to verbalize stay in the tube for sternal precautions but requires return demo for adl task. Pt could benefit from continued education for LB adls. Pt internally distracted with constipation. Pt with x3 small stools this session and RN notified.  Patient will benefit from skilled OT acutely to increase independence and safety with ADLS to allow discharge Patient will benefit from intensive inpatient follow-up therapy, >3 hours/day .      If plan is discharge home, recommend the following:   A little help with walking and/or transfers;A little help with bathing/dressing/bathroom     Functional Status Assessment   Patient has had a recent decline in their functional status and demonstrates the ability to make significant improvements in function in a reasonable and predictable amount of time.     Equipment Recommendations   BSC/3in1;Other (comment) (RW)     Recommendations for Other Services         Precautions/Restrictions   Precautions Precautions: Fall;Sternal Restrictions Weight Bearing Restrictions Per Provider Order: Yes RUE Weight Bearing Per Provider Order: Non weight bearing LUE Weight Bearing Per Provider Order: Non weight bearing Other Position/Activity Restrictions: Sternal Precautions     Mobility Bed Mobility Overal bed mobility: Needs Assistance Bed Mobility: Supine to Sit, Sit to Supine Rolling: Modified independent (Device/Increase time)    Supine to sit: Min assist Sit to supine: Mod assist   General bed mobility comments: exiting on the L side with pillow pt with pad used to help progress hips to eob. pt reaching for therapist and cued not to pull. pt with (A) to place bil LE back on bed surface    Transfers Overall transfer level: Needs assistance   Transfers: Sit to/from Stand Sit to Stand: Contact guard assist                  Balance Overall balance assessment: Mild deficits observed, not formally tested                                         ADL either performed or assessed with clinical judgement   ADL Overall ADL's : Needs assistance/impaired Eating/Feeding: Independent   Grooming: Wash/dry face;Wash/dry hands;Oral care;Set up;Sitting         Lower Body Bathing Details (indicate cue type and reason): q         Toilet Transfer: Contact guard assist;Ambulation;Regular Toilet;Rolling walker (2 wheels);Cueing for safety   Toileting- Clothing Manipulation and Hygiene: Maximal assistance;Sit to/from stand (cueing for sternal precautions)       Functional mobility during ADLs: Contact guard assist;Rolling walker (2 wheels)       Vision Baseline Vision/History: 1 Wears glasses Patient Visual Report: No change from baseline Vision Assessment?: No apparent visual deficits     Perception         Praxis         Pertinent Vitals/Pain Pain Assessment Pain Assessment: Faces Faces  Pain Scale: Hurts a little bit Pain Location: saccrum Pain Descriptors / Indicators: Discomfort Pain Intervention(s): Monitored during session, Premedicated before session, Repositioned, Limited activity within patient's tolerance     Extremity/Trunk Assessment Upper Extremity Assessment Upper Extremity Assessment: Right hand dominant;Generalized weakness   Lower Extremity Assessment Lower Extremity Assessment: Defer to PT evaluation   Cervical / Trunk Assessment Cervical / Trunk  Assessment: Other exceptions (sternal)   Communication Communication Communication: No apparent difficulties   Cognition Arousal: Alert Behavior During Therapy: WFL for tasks assessed/performed Cognition: Cognition impaired     Awareness: Intellectual awareness intact, Online awareness impaired       OT - Cognition Comments: needed cues for peri care and sternal precautions. pt cued not to reach outside base for things on R side. OT moving closer to the patient and pt pushing tray table away from bed surface                 Following commands: Intact       Cueing  General Comments   Cueing Techniques: Verbal cues  VSS on RA   Exercises     Shoulder Instructions      Home Living Family/patient expects to be discharged to:: Private residence Living Arrangements: Alone Available Help at Discharge: Family;Friend(s);Available PRN/intermittently Type of Home: House Home Access: Stairs to enter [employer] of Steps: 2 Entrance Stairs-Rails: None Home Layout: One level     Bathroom Shower/Tub: Chief Strategy Officer: Handicapped height     Home Equipment: Agricultural consultant (2 wheels);Hand held shower head;Grab bars - tub/shower          Prior Functioning/Environment Prior Level of Function : Independent/Modified Independent;Driving             Mobility Comments: Independent, not using RW, Active in garden/lawn care ADLs Comments: ind, works outdoors in his yard.    OT Problem List: Decreased activity tolerance;Impaired balance (sitting and/or standing);Decreased safety awareness;Decreased knowledge of use of DME or AE;Decreased knowledge of precautions;Cardiopulmonary status limiting activity   OT Treatment/Interventions: Self-care/ADL training;Therapeutic exercise;Energy conservation;DME and/or AE instruction;Therapeutic activities;Patient/family education;Balance training      OT Goals(Current goals can be found in the care  plan section)   Acute Rehab OT Goals Patient Stated Goal: to get a enema to help with constipation OT Goal Formulation: With patient Time For Goal Achievement: 10/03/23 Potential to Achieve Goals: Good   OT Frequency:  Min 2X/week    Co-evaluation              AM-PAC OT 6 Clicks Daily Activity     Outcome Measure Help from another person eating meals?: None Help from another person taking care of personal grooming?: None Help from another person toileting, which includes using toliet, bedpan, or urinal?: A Little Help from another person bathing (including washing, rinsing, drying)?: A Little Help from another person to put on and taking off regular upper body clothing?: A Little Help from another person to put on and taking off regular lower body clothing?: A Lot 6 Click Score: 19   End of Session Equipment Utilized During Treatment: Rolling walker (2 wheels) Nurse Communication: Mobility status;Precautions  Activity Tolerance: Patient tolerated treatment well Patient left: in bed;with call bell/phone within reach;with SCD's reapplied  OT Visit Diagnosis: Unsteadiness on feet (R26.81);Muscle weakness (generalized) (M62.81)                Time: 1415-1440 OT Time Calculation (min): 25 min Charges:  OT General Charges $OT Visit:  1 Visit OT Evaluation $OT Eval Moderate Complexity: 1 Mod   Brynn, OTR/L  Acute Rehabilitation Services Office: [phone] .   Ely Molt 09/19/2023, 2:49 PM

## 2023-09-19 NOTE — Progress Notes (Signed)
 Echo attempted, waited until PT finished and after ambulation patient wanted to try for a bm. Will reattempt as schedule permits.  [facility] Ezequias Lard RDCS

## 2023-09-19 NOTE — Progress Notes (Signed)
 7 Days Post-Op Procedure(s) (LRB): OFF PUMP CORONARY ARTERY BYPASS GRAFTING X 1, USING LEFT INTERNAL MAMMARY ARTERY (N/A) Subjective:  Only complaint is constipation. Had enema last night with little stool. Still feels like he needs to go but hard.  Objective: Vital signs in last 24 hours: Temp:  [97.7 F (36.5 C)-98.1 F (36.7 C)] 98.1 F (36.7 C) (07/21 0317) Pulse Rate:  [87-100] 88 (07/21 0500) Cardiac Rhythm: Normal sinus rhythm;Sinus tachycardia (07/20 2000) Resp:  [15-23] 15 (07/21 0500) BP: (104-128)/(64-90) 109/66 (07/21 0500) SpO2:  [91 %-97 %] 97 % (07/21 0500) Arterial Line BP: (128-145)/(58-65) 128/58 (07/20 1000) Weight:  [61.2 kg] 61.2 kg (07/21 0500)  Hemodynamic parameters for last 24 hours: CVP:  [0 mmHg-6 mmHg] 2 mmHg  Intake/Output from previous day: 07/20 0701 - 07/21 0700 In: 7.5 [I.V.:7.5] Out: 1450 [Urine:1450] Intake/Output this shift: No intake/output data recorded.  General appearance: alert and cooperative Neurologic: intact Heart: regular rate and rhythm, S1, S2 normal, no murmur Lungs: clear to auscultation bilaterally Abdomen: soft, non-tender, non-distended, bowel sounds normal Extremities: no edema Wound: incision healing well  Lab Results: Recent Labs    09/18/23 0413 09/19/23 0504  WBC 7.6 10.6*  HGB 16.6 16.6  HCT 47.1 48.1  PLT 157 191   BMET:  Recent Labs    09/18/23 0413 09/19/23 0504  NA 130* 132*  K 4.3 4.0  CL 91* 92*  CO2 26 29  GLUCOSE 154* 144*  BUN 42* 34*  CREATININE 1.32* 1.10  CALCIUM 9.4 9.4    PT/INR: No results for input(s): LABPROT, INR in the last 72 hours. ABG    Component Value Date/Time   PHART 7.524 (H) 09/16/2023 1231   HCO3 30.6 (H) 09/16/2023 1231   TCO2 32 09/16/2023 1231   ACIDBASEDEF 6.0 (H) 09/12/2023 1606   O2SAT 75.6 09/19/2023 0500   CBG (last 3)  Recent Labs    09/18/23 2057 09/18/23 2351 09/19/23 0317  GLUCAP 248* 137* 115*    Assessment/Plan: S/P Procedure(s)  (LRB): OFF PUMP CORONARY ARTERY BYPASS GRAFTING X 1, USING LEFT INTERNAL MAMMARY ARTERY (N/A)  POD 7 Hemodynamically stable in sinus rhythm. Co-ox 76 this am.  Wt is at preop and no edema.  Constipation: will give him Sorbitol  this am.  I think he could transfer to 4E if ok with AHF team.   LOS: 13 days    Joel Pitts 09/19/2023

## 2023-09-20 ENCOUNTER — Inpatient Hospital Stay (HOSPITAL_COMMUNITY)

## 2023-09-20 DIAGNOSIS — I5023 Acute on chronic systolic (congestive) heart failure: Secondary | ICD-10-CM | POA: Diagnosis not present

## 2023-09-20 DIAGNOSIS — Z951 Presence of aortocoronary bypass graft: Secondary | ICD-10-CM

## 2023-09-20 LAB — BASIC METABOLIC PANEL WITH GFR
Anion gap: 8 (ref 5–15)
BUN: 29 mg/dL — ABNORMAL HIGH (ref 8–23)
CO2: 30 mmol/L (ref 22–32)
Calcium: 9.6 mg/dL (ref 8.9–10.3)
Chloride: 93 mmol/L — ABNORMAL LOW (ref 98–111)
Creatinine, Ser: 1.4 mg/dL — ABNORMAL HIGH (ref 0.61–1.24)
GFR, Estimated: 53 mL/min — ABNORMAL LOW (ref >60)
Glucose, Bld: 72 mg/dL (ref 70–99)
Potassium: 3.9 mmol/L (ref 3.5–5.1)
Sodium: 131 mmol/L — ABNORMAL LOW (ref 135–145)

## 2023-09-20 LAB — URINALYSIS, W/ REFLEX TO CULTURE (INFECTION SUSPECTED)
Bacteria, UA: NONE SEEN
Bilirubin Urine: NEGATIVE
Glucose, UA: >=500 mg/dL — AB
Ketones, ur: NEGATIVE mg/dL
Leukocytes,Ua: NEGATIVE
Nitrite: NEGATIVE
Protein, ur: 30 mg/dL — AB
Specific Gravity, Urine: 1.018 (ref 1.005–1.030)
pH: 7 (ref 5.0–8.0)

## 2023-09-20 LAB — CBC
HCT: 50.2 % (ref 39.0–52.0)
Hemoglobin: 17.5 g/dL — ABNORMAL HIGH (ref 13.0–17.0)
MCH: 32.3 pg (ref 26.0–34.0)
MCHC: 34.9 g/dL (ref 30.0–36.0)
MCV: 92.6 fL (ref 80.0–100.0)
Platelets: 235 K/uL (ref 150–400)
RBC: 5.42 MIL/uL (ref 4.22–5.81)
RDW: 12.5 % (ref 11.5–15.5)
WBC: 14 K/uL — ABNORMAL HIGH (ref 4.0–10.5)
nRBC: 0 % (ref 0.0–0.2)

## 2023-09-20 LAB — COOXEMETRY PANEL
Carboxyhemoglobin: 1.7 % — ABNORMAL HIGH (ref 0.5–1.5)
Carboxyhemoglobin: 1.6 % — ABNORMAL HIGH (ref 0.5–1.5)
Methemoglobin: <0.7 % (ref 0.0–1.5)
Methemoglobin: <0.7 % (ref 0.0–1.5)
O2 Saturation: 55.3 %
O2 Saturation: 73.5 %
Total hemoglobin: 18 g/dL — ABNORMAL HIGH (ref 12.0–16.0)
Total hemoglobin: 16.8 g/dL — ABNORMAL HIGH (ref 12.0–16.0)

## 2023-09-20 LAB — GLUCOSE, CAPILLARY
Glucose-Capillary: 70 mg/dL (ref 70–99)
Glucose-Capillary: 244 mg/dL — ABNORMAL HIGH (ref 70–99)
Glucose-Capillary: 160 mg/dL — ABNORMAL HIGH (ref 70–99)
Glucose-Capillary: 95 mg/dL (ref 70–99)

## 2023-09-20 MED ORDER — INSULIN ASPART 100 UNIT/ML IJ SOLN
0.0000 [IU] | Freq: Three times a day (TID) | INTRAMUSCULAR | Status: DC
  Administered 2023-09-20: 3 [IU] via SUBCUTANEOUS
  Administered 2023-09-21 (×2): 2 [IU] via SUBCUTANEOUS
  Administered 2023-09-21 – 2023-09-22 (×2): 5 [IU] via SUBCUTANEOUS
  Administered 2023-09-22 (×2): 2 [IU] via SUBCUTANEOUS
  Administered 2023-09-23: 3 [IU] via SUBCUTANEOUS

## 2023-09-20 MED ORDER — DIGOXIN 125 MCG PO TABS
0.1250 mg | ORAL_TABLET | Freq: Every day | ORAL | Status: DC
  Administered 2023-09-20 – 2023-09-23 (×4): 0.125 mg via ORAL
  Filled 2023-09-20 (×4): qty 1

## 2023-09-20 MED ORDER — INSULIN ASPART 100 UNIT/ML IJ SOLN
2.0000 [IU] | Freq: Three times a day (TID) | INTRAMUSCULAR | Status: DC
  Administered 2023-09-20 – 2023-09-23 (×8): 2 [IU] via SUBCUTANEOUS

## 2023-09-20 MED ORDER — COLCHICINE 0.6 MG PO TABS
0.6000 mg | ORAL_TABLET | Freq: Every day | ORAL | Status: DC
  Administered 2023-09-20 – 2023-09-23 (×4): 0.6 mg via ORAL
  Filled 2023-09-20 (×4): qty 1

## 2023-09-20 MED ORDER — INSULIN GLARGINE-YFGN 100 UNIT/ML ~~LOC~~ SOLN
8.0000 [IU] | Freq: Two times a day (BID) | SUBCUTANEOUS | Status: DC
  Administered 2023-09-20 – 2023-09-23 (×6): 8 [IU] via SUBCUTANEOUS
  Filled 2023-09-20 (×8): qty 0.08

## 2023-09-20 MED ORDER — INSULIN ASPART 100 UNIT/ML IJ SOLN: 0.0000 [IU] | Freq: Three times a day (TID) | INTRAMUSCULAR | Status: DC

## 2023-09-20 NOTE — Plan of Care (Signed)
  Problem: Education: Goal: Knowledge of General Education information will improve Description: Including pain rating scale, medication(s)/side effects and non-pharmacologic comfort measures Outcome: Progressing   Problem: Clinical Measurements: Goal: Ability to maintain clinical measurements within normal limits will improve Outcome: Progressing Goal: Will remain free from infection Outcome: Progressing Goal: Diagnostic test results will improve Outcome: Progressing Goal: Respiratory complications will improve Outcome: Progressing Goal: Cardiovascular complication will be avoided Outcome: Progressing   Problem: Activity: Goal: Risk for activity intolerance will decrease Outcome: Progressing   Problem: Nutrition: Goal: Adequate nutrition will be maintained Outcome: Progressing   Problem: Coping: Goal: Level of anxiety will decrease Outcome: Progressing   Problem: Elimination: Goal: Will not experience complications related to bowel motility Outcome: Progressing Goal: Will not experience complications related to urinary retention Outcome: Progressing   Problem: Pain Managment: Goal: General experience of comfort will improve and/or be controlled Outcome: Progressing   Problem: Safety: Goal: Ability to remain free from injury will improve Outcome: Progressing   Problem: Skin Integrity: Goal: Risk for impaired skin integrity will decrease Outcome: Progressing   Problem: Education: Goal: Understanding of CV disease, CV risk reduction, and recovery process will improve Outcome: Progressing   Problem: Activity: Goal: Ability to return to baseline activity level will improve Outcome: Progressing   Problem: Cardiovascular: Goal: Ability to achieve and maintain adequate cardiovascular perfusion will improve Outcome: Progressing Goal: Vascular access site(s) Level 0-1 will be maintained Outcome: Progressing   Problem: Health Behavior/Discharge Planning: Goal:  Ability to safely manage health-related needs after discharge will improve Outcome: Progressing   Problem: Education: Goal: Will demonstrate proper wound care and an understanding of methods to prevent future damage Outcome: Progressing Goal: Knowledge of the prescribed therapeutic regimen will improve Outcome: Progressing   Problem: Activity: Goal: Risk for activity intolerance will decrease Outcome: Progressing   Problem: Cardiac: Goal: Will achieve and/or maintain hemodynamic stability Outcome: Progressing   Problem: Clinical Measurements: Goal: Postoperative complications will be avoided or minimized Outcome: Progressing   Problem: Respiratory: Goal: Respiratory status will improve Outcome: Progressing   Problem: Skin Integrity: Goal: Wound healing without signs and symptoms of infection Outcome: Progressing Goal: Risk for impaired skin integrity will decrease Outcome: Progressing   Problem: Urinary Elimination: Goal: Ability to achieve and maintain adequate renal perfusion and functioning will improve Outcome: Progressing   Problem: Coping: Goal: Ability to adjust to condition or change in health will improve Outcome: Progressing   Problem: Fluid Volume: Goal: Ability to maintain a balanced intake and output will improve Outcome: Progressing   Problem: Metabolic: Goal: Ability to maintain appropriate glucose levels will improve Outcome: Progressing

## 2023-09-20 NOTE — Progress Notes (Addendum)
 [address] [place] [place] [address]             [phone]      8 Days Post-Op Procedure(s) (LRB): OFF PUMP CORONARY ARTERY BYPASS GRAFTING X 1, USING LEFT INTERNAL MAMMARY ARTERY (N/A) Subjective: Patient without complaints this AM. States he feels much better after having a bowel movement last night. Just ate his breakfast.   Objective: Vital signs in last 24 hours: Temp:  [97.6 F (36.4 C)-98.5 F (36.9 C)] 98.5 F (36.9 C) (07/22 0400) Pulse Rate:  [88-106] 93 (07/22 0630) Cardiac Rhythm: Normal sinus rhythm (07/22 0400) Resp:  [11-30] 19 (07/22 0630) BP: (84-127)/(54-83) 113/64 (07/22 0630) SpO2:  [90 %-96 %] 92 % (07/22 0630) Weight:  [61 kg] 61 kg (07/22 0500)  Hemodynamic parameters for last 24 hours:    Intake/Output from previous day: 07/21 0701 - 07/22 0700 In: -  Out: 1400 [Urine:1400] Intake/Output this shift: No intake/output data recorded.  General appearance: alert, cooperative, and no distress Neurologic: intact Heart: regular rate and rhythm, no murmur, friction rub heard Lungs: diminished bibasilar breath sounds, slightly coarse breath sounds Abdomen: soft, non-tender; bowel sounds normal; no masses,  no organomegaly Extremities: extremities normal, atraumatic, no cyanosis or edema Wound: Clean and dry without sign of infection  Lab Results: Recent Labs    09/19/23 0504 09/20/23 0410  WBC 10.6* 14.0*  HGB 16.6 17.5*  HCT 48.1 50.2  PLT 191 235   BMET:  Recent Labs    09/19/23 0504 09/20/23 0410  NA 132* 131*  K 4.0 3.9  CL 92* 93*  CO2 29 30  GLUCOSE 144* 72  BUN 34* 29*  CREATININE 1.10 1.40*  CALCIUM 9.4 9.6    PT/INR: No results for input(s): LABPROT, INR in the last 72 hours. 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 No plavix, on Eliquis  for LV thrombus. No BB due to low output per AHF team. Acute on chronic CHF, AHF team following and titrating GDMT. On Jardiance  10mg  daily, Cozaar  25mg  daily, and spironolactone  12.5mg  daily. SBP 104-118. NSR HR 90s. Coox 55.3 this AM. Echo yesterday LVEF 30-35%. Pericardial rub heard on exam, no ST elevation or sign of pericarditis on monitor. May need colchicine , will discuss with surgeon.   Pulm: Saturating well on RA. CXR with likely small left pleural effusion possible trace right pleural effusion and atelectasis as well as streaky left lower lobe atelectasis. Patient only using IS every once in a while, discussed proper use. Encourage IS and ambulation.  GI: +BM after sorbitol  yesterday, tolerating a diet.   Endo: T2DM, preop A1C 7.2. CBGs 184/238/70 on SSI. On Metformin at home. Will restart closer to discharge once creatinine has improved.   Renal: AKI, Cr 1.4, increased from 1.1 yesterday after the addition of GDMT. Lasix  held yesterday. UO 1400cc/24hrs. At preop weight. Would continue to hold Lasix .   ID: Leukocytosis, WBC 14 up from 10.6 yesterday. Afebrile. External catheter in place,  will check UA. Patient reports minor cough but coughing up clear sputum. No clear pneumonia on CXR. Possibly due to atelectasis. Wounds are clean without sign of infection. Could also be due to PICC.  DVT Prophylaxis: On Eliquis  5mg  BID and ASA 81mg  daily for LV thrombus  Deconditioning: Potential CIR admit, CIR is following. Continue PT/OT.   Dispo: 4E bed pending   LOS: 14 days    Con GORMAN Bend, PA-C 09/20/2023   Chart reviewed, patient examined, agree with above.  He feels well. He has a pericardial rub on exam, mild  leukocytosis and this could be due to postop pericarditis. Colchicine  started. ECG unchanged with NSR, anteroseptal infarct. Mild creat bump today. Will repeat tomorrow. Continue ambulation, IS. Awaiting bed on 4E.

## 2023-09-20 NOTE — Progress Notes (Signed)
Inpatient Rehab Admissions Coordinator:    CIR following. Case pending with insurance.   Megan Salon, MS, CCC-SLP Rehab Admissions Coordinator  [phone] (celll) [phone] (office)

## 2023-09-20 NOTE — Progress Notes (Signed)
 Advanced Heart Failure Rounding Note  Cardiologist: None  AHF MD: Dr. Rolan Chief Complaint: acute systolic heart failure, post cardiotomy shock  Patient Profile   73 y/o male admitted w/ chest pain and new systolic heart failure and LV thrombus. EF 20%, RV mildly reduced. Cath w/ 99% stenosis of LAD, now s/p CABG x 1. Post op course c/b post cardiotomy shock and ATN.    Subjective:     7/14: S/p off pump CABG x 1 LIMA-LAD. Extubated same day  7/15: worsening dyspnea w/ anuric AKI, required escalation of inotrope's. POCUS ECHO with EF 25% RV markedly dilated an severe HK. Septum flattened. Started on iNO, lasix  gtt, and dual inotropes 7/18: Off iNO + DBA. PAC pulled.  7/20: Off milrinone  7/21: Echo with EF 30-35% and mod reduced RV  POD#9  Coox 56%.  WBC 10.6>14. Afebrile. CXR with small b/l pleural effusions sCr 1.3>1.1>1.4  Sitting up in bed. Feeling okay. Started having burning in chest last night, still having this morning 4/10. Not radiating. No SOB.   Objective:    Weight Range: 61 kg Body mass index is 21.71 kg/m.   Vital Signs:   Temp:  [97.6 F (36.4 C)-98.5 F (36.9 C)] 97.8 F (36.6 C) (07/22 0745) Pulse Rate:  [88-106] 98 (07/22 0800) Resp:  [11-30] 13 (07/22 0800) BP: (84-127)/(54-83) 105/60 (07/22 0800) SpO2:  [90 %-96 %] 96 % (07/22 0800) Weight:  [61 kg] 61 kg (07/22 0500) Last BM Date : 09/19/23  Weight change: Filed Weights   09/18/23 0500 09/19/23 0500 09/20/23 0500  Weight: 60.1 kg 61.2 kg 61 kg   Intake/Output:  Intake/Output Summary (Last 24 hours) at 09/20/2023 0944 Last data filed at 09/20/2023 0500 Gross per 24 hour  Intake --  Output 1400 ml  Net -1400 ml    Physical Exam   General: Elderly appearing. No distress on RA Cardiac: JVP flat. S1 and S2 present. 1/6 Systolic murmur at apex Abdomen: Soft, non-tender, non-distended.  Extremities: Warm and dry.  No edema.  Neuro: Alert and oriented x3.   Telemetry   SR in 90s  (personally reviewed)  Labs    CBC Recent Labs    09/19/23 0504 09/20/23 0410  WBC 10.6* 14.0*  HGB 16.6 17.5*  HCT 48.1 50.2  MCV 93.0 92.6  PLT 191 235   Basic Metabolic Panel Recent Labs    92/78/74 0504 09/20/23 0410  NA 132* 131*  K 4.0 3.9  CL 92* 93*  CO2 29 30  GLUCOSE 144* 72  BUN 34* 29*  CREATININE 1.10 1.40*  CALCIUM 9.4 9.6   Medications:    Scheduled Medications:  apixaban   5 mg Oral BID   aspirin  EC  81 mg Oral Daily   bisacodyl   10 mg Oral Daily   Or   bisacodyl   10 mg Rectal Daily   Chlorhexidine  Gluconate Cloth  6 each Topical Daily   colchicine   0.6 mg Oral Daily   docusate sodium   200 mg Oral Daily   empagliflozin   10 mg Oral Daily   feeding supplement  237 mL Oral BID BM   insulin  aspart  0-15 Units Subcutaneous TID WC   insulin  aspart  2 Units Subcutaneous TID WC   insulin  glargine-yfgn  8 Units Subcutaneous BID   losartan   25 mg Oral Daily   pantoprazole   40 mg Oral Daily   spironolactone   12.5 mg Oral Daily   tamsulosin   0.4 mg Oral PC supper   Infusions:  PRN Medications: dextrose , ondansetron  (ZOFRAN ) IV, mouth rinse, mouth rinse, oxyCODONE , polyethylene glycol, sodium chloride , sodium chloride  flush, traMADol   Assessment/Plan   Acute systolic CHF w/ Post Cardiotomy Shock:  - Suspect late presentation of anterior STEMI, CMR indicated some viability - Echo 09/07/23 with EF <20%, G2DD, and mildly reduced RV - CMR 09/09/23 with LVEF 21%, RVEF 25%, ischemic LGE pattern, layered mural thrombus - [facility] 09/09/23 with nomal filling pressues, mild PH, low CO 2.04, and ost LAD with severely calcified disease. No good landing zones for PCI to LAD. - s/p CABG x 1, LIMA-LAD c/b post-cardiotomy shock and ATN   - Post-op echo with EF 30-35% and mod reduced RV function - Required dual inotropes and high dose diuretics but now significantly improved. Off iNO and DBA 7/18. Off milrinone  7/20. Coox stable.  - Coox 56. 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 S/p off pump CABG 7/14. EKG with large anterior q waves. - continue ASA + statin - Continue eliquis  5 mg bid for LV thrombus - no ? blocker w/ low output  - having burning chest pain 4/10 that started overnight, has not worsened. Check EKG.   AKI : Suspect post operative ATN, improved with high dose diuretics. Resolved - slight bump 1.1>1.4 with addition of GDMT. Plan as above  Apical thrombus: Noted on contrast images and somewhat equivocal. Confirmed by cMRI, layered LV apical mural thrombus  - continue eliquis  5 mg bid  Acute Hypoxic Respiratory Failure - resolved  Constipation - KUB yesterday with large stool burden, no ileus  - significant BM yesterday after SMOG edema - continue scheduled bowel reg  Leukocytosis - 10.6>14; afebrile - CXR today showed small b/l pleural effusions; no evidence of PNA - sending UA  - plan to remove PICC pending d/s Dr. Gardenia Maine to transfer to 4E when bed available  Length of Stay: 14  [place] Sicilia Killough, NP 09/20/23, 09/20/23  Advanced Heart Failure Team Pager [phone] (M-F; 7a - 5p)  Please contact [facility] for night-coverage after hours (4p -7a ) and weekends on [email or web address]

## 2023-09-20 NOTE — PMR Pre-admission (Shared)
 PMR Admission Coordinator Pre-Admission Assessment  Patient: Joel Pitts is an 73 y.o., male MRN: [id number] DOB: 1951-01-09 Height: 5' 6 (167.6 cm) Weight: 61 kg  Insurance Information HMO: PPO: yes    PCP:      IPA:      80/20:      OTHER:  PRIMARY: UHC Medicare      Policy#: [id number], Medicare: [id number]      Subscriber:  CM Name:       Phone#: [phone]     Fax#: [phone]  Pre-Cert#: [id number]       Employer:  Benefits:  Phone #:      Name:  Eustacio Date: 03/02/2023 - present Deductible: $250 ($250 met) OOP Max: $2,250 ($263.21 met) CIR: $230/day co-pay for days 1-7, $0/day co-pay for days 8+ SNF: $0.00 Copayment per day for days 1-20; $100.00 Copayment per day for days 21-42; $0.00 Copayment per day for days 43-100 for Medicare-covered care/maximum 100 days/benefit period Outpatient: 80% coverage, 20% co-insurance Home Health:  100% coverage DME: 80% coverage, 20% co-insurance Providers: in network   SECONDARY:       Policy#:      Phone#:   Artist:       Phone#:   The Data processing manager" for patients in Inpatient Rehabilitation Facilities with attached "Privacy Act Statement-Health Care Records" was provided and verbally reviewed with: Patient  Emergency Contact Information Contact Information     Name Relation Home Work Mobile   [place] Sister   [phone]   crews,Barry Alyse   [phone]      Other Contacts   None on File     Current Medical History  Patient Admitting Diagnosis: LV thrombus s/p  CABG History of Present Illness: Joel Jayson. Pitts is a 73 year old male wit history of T2DM and nephrolithiasis who was as admitted on 09/06/23 with 2 week history of SOB with fatigue, chest pressure with difficulty lying flat and BLE edema and found to have elevated D dimer with bilateral pleural effusions due to acute systolic HF.  CT chest negative for PE. He was started on IV diuresis and Dr. Rolan consulted for  input. 2 D echo showed EF <20% with global hypokinesis, possible layering thrombus and moderately dilated LA. He was started on IV heparin  due to possible thrombus as well as GDMT. Cardiac cath showed ostial LAD disease with 99% stenosis distal LM to proximal LAD and MRI viability study ordered.  Cardiac MRI showed normal LV size with LAD disease, EF 21%, elevated myocardial fibrotic content and layered LV apical mural thrombus with significant viability of LAD territory. Dr.Lightfoot consulted and patient underwent CABG X 1 LIMA LAD on 07/14.    Post op extubated without difficulty and low put cardiogenic shock treated with dobutamine  and Epi and milrinone  added for diuresis. AKI felt to be due to ATN and HF team following for fluid overload. He did develop SOB requiring HFNC management and started on inotrope's and high dose lasix  gtt.  He was weaned off pressors, GDMT resumed and was transitioned to Eliquis . Noted to have rise in WBC, reports of chest burning 07/21 overnight and noted of pericardial rub. Colchicine  added for concerns of pericarditis 7/22.    Lives alone and was independent PTA. Eats out. Did not use AD and has friends who can check in on him.  Pt. Was seen by PT/OT during his admission and they recommended CIR to assist return to PLOF.       Patient's  medical record from [facility]  has been reviewed by the rehabilitation admission coordinator and physician.  Past Medical History  Past Medical History:  Diagnosis Date   Diabetes mellitus without complication (HCC)    History of kidney stones     Has the patient had major surgery during 100 days prior to admission? Yes  Family History   family history is not on file.  Current Medications  Current Facility-Administered Medications:    apixaban  (ELIQUIS ) tablet 5 mg, 5 mg, Oral, BID, Lightfoot, Harrell O, MD, 5 mg at 09/20/23 9096   aspirin  EC tablet 81 mg, 81 mg, Oral, Daily, Lee, [place], NP, 81 mg at  09/20/23 0902   bisacodyl  (DULCOLAX) EC tablet 10 mg, 10 mg, Oral, Daily, 10 mg at 09/20/23 0903 **OR** bisacodyl  (DULCOLAX) suppository 10 mg, 10 mg, Rectal, Daily, Zimmerman, Donielle M, PA-C   Chlorhexidine  Gluconate Cloth 2 % PADS 6 each, 6 each, Topical, Daily, Lightfoot, Linnie KIDD, MD, 6 each at 09/20/23 9093   colchicine  tablet 0.6 mg, 0.6 mg, Oral, Daily, Raguel Benders S, PA-C, 0.6 mg at 09/20/23 1121   dextrose  50 % solution 0-50 mL, 0-50 mL, Intravenous, PRN, Zimmerman, Donielle M, PA-C   docusate sodium  (COLACE) capsule 200 mg, 200 mg, Oral, Daily, Zimmerman, Donielle M, PA-C, 200 mg at 09/20/23 9097   feeding supplement (ENSURE PLUS HIGH PROTEIN) liquid 237 mL, 237 mL, Oral, BID BM, Lightfoot, Harrell O, MD, 237 mL at 09/20/23 0920   insulin  aspart (novoLOG ) injection 0-15 Units, 0-15 Units, Subcutaneous, TID WC, Raguel Benders RAMAN, PA-C, 3 Units at 09/20/23 1121   insulin  aspart (novoLOG ) injection 2 Units, 2 Units, Subcutaneous, TID WC, Raguel Benders RAMAN, PA-C, 2 Units at 09/20/23 1121   insulin  glargine-yfgn (SEMGLEE ) injection 8 Units, 8 Units, Subcutaneous, BID, Chambers, Bailey S, PA-C   losartan  (COZAAR ) tablet 25 mg, 25 mg, Oral, Daily, Lee, [place], NP, 25 mg at 09/20/23 9097   ondansetron  (ZOFRAN ) injection 4 mg, 4 mg, Intravenous, Q6H PRN, Zimmerman, Donielle M, PA-C, 4 mg at 09/14/23 1607   Oral care mouth rinse, 15 mL, Mouth Rinse, PRN, Lightfoot, Linnie KIDD, MD   Oral care mouth rinse, 15 mL, Mouth Rinse, PRN, Lightfoot, Linnie KIDD, MD   oxyCODONE  (Oxy IR/ROXICODONE ) immediate release tablet 5-10 mg, 5-10 mg, Oral, Q3H PRN, Zimmerman, Donielle M, PA-C, 10 mg at 09/16/23 1842   pantoprazole  (PROTONIX ) EC tablet 40 mg, 40 mg, Oral, Daily, Zimmerman, Donielle M, PA-C, 40 mg at 09/20/23 9096   polyethylene glycol (MIRALAX  / GLYCOLAX ) packet 17 g, 17 g, Oral, Daily PRN, Lightfoot, Harrell O, MD, 17 g at 09/19/23 0616   sodium chloride  (OCEAN) 0.65 % nasal spray 1 spray, 1  spray, Each Nare, PRN, Lucas Dorise POUR, MD, 1 spray at 09/19/23 1030   sodium chloride  flush (NS) 0.9 % injection 3-10 mL, 3-10 mL, Intravenous, PRN, Zimmerman, Donielle M, PA-C   spironolactone  (ALDACTONE ) tablet 12.5 mg, 12.5 mg, Oral, Daily, Lee, [place], NP, 12.5 mg at 09/20/23 9096   tamsulosin  (FLOMAX ) capsule 0.4 mg, 0.4 mg, Oral, PC supper, Zimmerman, Donielle M, PA-C, 0.4 mg at 09/19/23 1729   traMADol  (ULTRAM ) tablet 50-100 mg, 50-100 mg, Oral, Q4H PRN, Zimmerman, Donielle M, PA-C, 100 mg at 09/18/23 9243  Patients Current Diet:  Diet Order             Diet Heart Room service appropriate? Yes; Fluid consistency: Thin  Diet effective now  Precautions / Restrictions Precautions Precautions: Fall, Sternal Precaution/Restrictions Comments: educated for precautions Restrictions Weight Bearing Restrictions Per Provider Order: Yes (sternal precautions) RUE Weight Bearing Per Provider Order: Non weight bearing LUE Weight Bearing Per Provider Order: Non weight bearing Other Position/Activity Restrictions: Sternal Precautions   Has the patient had 2 or more falls or a fall with injury in the past year? No  Prior Activity Level Community (5-7x/wk): Pt. active in the  community PTA  Prior Functional Level Self Care: Did the patient need help bathing, dressing, using the toilet or eating? Independent  Indoor Mobility: Did the patient need assistance with walking from room to room (with or without device)? Independent  Stairs: Did the patient need assistance with internal or external stairs (with or without device)? Independent  Functional Cognition: Did the patient need help planning regular tasks such as shopping or remembering to take medications? Independent  Patient Information Are you of Hispanic, Latino/a,or Spanish origin?: A. No, not of Hispanic, Latino/a, or Spanish origin What is your race?: A. White Do you need or want an interpreter to  communicate with a doctor or health care staff?: 0. No  Patient's Response To:  Health Literacy and Transportation Is the patient able to respond to health literacy and transportation needs?: Yes Health Literacy - How often do you need to have someone help you when you read instructions, pamphlets, or other written material from your doctor or pharmacy?: Never In the past 12 months, has lack of transportation kept you from medical appointments or from getting medications?: No In the past 12 months, has lack of transportation kept you from meetings, work, or from getting things needed for daily living?: No  Home Assistive Devices / Equipment Home Equipment: Agricultural consultant (2 wheels), Hand held shower head, Grab bars - tub/shower  Prior Device Use: Indicate devices/aids used by the patient prior to current illness, exacerbation or injury? None of the above  Current Functional Level Cognition  Orientation Level: Oriented X4    Extremity Assessment (includes Sensation/Coordination)  Upper Extremity Assessment: Right hand dominant, Generalized weakness  Lower Extremity Assessment: Defer to PT evaluation    ADLs  Overall ADL's : Needs assistance/impaired Eating/Feeding: Independent Grooming: Wash/dry face, Wash/dry hands, Oral care, Set up, Sitting Lower Body Bathing Details (indicate cue type and reason): q Toilet Transfer: Contact guard assist, Ambulation, Regular Toilet, Rolling walker (2 wheels), Cueing for safety Toileting- Clothing Manipulation and Hygiene: Maximal assistance, Sit to/from stand (cueing for sternal precautions) Functional mobility during ADLs: Contact guard assist, Rolling walker (2 wheels)    Mobility  Overal bed mobility: Needs Assistance Bed Mobility: Supine to Sit, Sit to Supine Rolling: Modified independent (Device/Increase time) Sidelying to sit: Min assist Supine to sit: Min assist Sit to supine: Mod assist General bed mobility comments: exiting on the L  side with pillow pt with pad used to help progress hips to eob. pt reaching for therapist and cued not to pull. pt with (A) to place bil LE back on bed surface    Transfers  Overall transfer level: Needs assistance Equipment used: None Transfers: Sit to/from Stand Sit to Stand: Contact guard assist Bed to/from chair/wheelchair/BSC transfer type:: Step pivot Step pivot transfers: Min assist General transfer comment: CGA to rise from bed and toilet. Pt able to perform 6 repeated sit to stands from EOB and x 2 from toilet with cues for hand placement    Ambulation / Gait / Stairs / Wheelchair Mobility  Ambulation/Gait Ambulation/Gait assistance: Contact guard assist  Gait Distance (Feet): 400 Feet Assistive device: Rolling walker (2 wheels) Gait Pattern/deviations: Step-through pattern, Decreased stride length General Gait Details: cues for posture and breathing technique with pt maintaining >90% on RA with max HR 105 Pre-gait activities: Weight shift, static march, two steps forward and backwards with min assist for balance, leaning posteriorly.    Posture / Balance Balance Overall balance assessment: Mild deficits observed, not formally tested Sitting-balance support: No upper extremity supported, Feet supported Sitting balance-Leahy Scale: Fair Standing balance support: No upper extremity supported, During functional activity Standing balance-Leahy Scale: Poor Standing balance comment: Min-mod assist for balance holding heart pillow for comfort.    Special needs/care consideration Skin ***   Previous Home Environment (from acute therapy documentation) Living Arrangements: Alone Available Help at Discharge: Family, Friend(s), Available PRN/intermittently Type of Home: House Home Layout: One level Home Access: Stairs to enter Entrance Stairs-Rails: None Entrance Stairs-Number of Steps: 2 Bathroom Shower/Tub: Engineer, manufacturing systems: Handicapped height Bathroom  Accessibility: Yes Home Care Services: No  Discharge Living Setting Plans for Discharge Living Setting: House Type of Home at Discharge: House Discharge Home Layout: One level Discharge Home Access: Stairs to enter Entrance Stairs-Rails: None Entrance Stairs-Number of Steps: 2 Discharge Bathroom Shower/Tub: Tub/shower unit Discharge Bathroom Toilet: Handicapped height Discharge Bathroom Accessibility: Yes How Accessible: Accessible via walker Does the patient have any problems obtaining your medications?: No  Social/Family/Support Systems    Goals Patient/Family Goal for Rehab: PT/OT mod I Expected length of stay: 5-7 days Pt/Family Agrees to Admission and willing to participate: Yes Program Orientation Provided & Reviewed with Pt/Caregiver Including Roles  & Responsibilities: Yes  Decrease burden of Care through IP rehab admission: not anticipated  Possible need for SNF placement upon discharge: not anticipated  Patient Condition: I have reviewed medical records from [facility], spoken with CM, and patient. I met with patient at the bedside for inpatient rehabilitation assessment.  Patient will benefit from ongoing PT and OT, can actively participate in 3 hours of therapy a day 5 days of the week, and can make measurable gains during the admission.  Patient will also benefit from the coordinated team approach during an Inpatient Acute Rehabilitation admission.  The patient will receive intensive therapy as well as Rehabilitation physician, nursing, social worker, and care management interventions.  Due to safety, skin/wound care, disease management, medication administration, pain management, and patient education the patient requires 24 hour a day rehabilitation nursing.  The patient is currently *** with mobility and basic ADLs.  Discharge setting and therapy post discharge at home with home health is anticipated.  Patient has agreed to participate in the Acute  Inpatient Rehabilitation Program and will admit {Time; today/tomorrow:10263}.  Preadmission Screen Completed By:  Leita KATHEE Kleine, 09/20/2023 2:50 PM ______________________________________________________________________   Discussed status with Dr. PIERRETTE on *** at *** and received approval for admission today.  Admission Coordinator:  Leita KATHEE Kleine, CCC-SLP, time PIERRETTEPattricia ***   Assessment/Plan: Diagnosis: *** Does the need for close, 24 hr/day Medical supervision in concert with the patient's rehab needs make it unreasonable for this patient to be served in a less intensive setting? {yes_no_potentially:3041433} Co-Morbidities requiring supervision/potential complications: *** Due to {due un:6958565}, does the patient require 24 hr/day rehab nursing? {yes_no_potentially:3041433} Does the patient require coordinated care of a physician, rehab nurse, PT, OT, and SLP to address physical and functional deficits in the context of the above medical diagnosis(es)? {yes_no_potentially:3041433} Addressing deficits in the following areas: {deficits:3041436} Can the patient actively  participate in an intensive therapy program of at least 3 hrs of therapy 5 days a week? {yes_no_potentially:3041433} The potential for patient to make measurable gains while on inpatient rehab is {potential:3041437} Anticipated functional outcomes upon discharge from inpatient rehab: {functional outcomes:304600100} PT, {functional outcomes:304600100} OT, {functional outcomes:304600100} SLP Estimated rehab length of stay to reach the above functional goals is: *** Anticipated discharge destination: {anticipated dc setting:21604} 10. Overall Rehab/Functional Prognosis: {potential:3041437}   MD Signature: ***

## 2023-09-21 ENCOUNTER — Encounter (HOSPITAL_COMMUNITY): Payer: Self-pay | Admitting: Thoracic Surgery (Cardiothoracic Vascular Surgery)

## 2023-09-21 DIAGNOSIS — I5023 Acute on chronic systolic (congestive) heart failure: Secondary | ICD-10-CM | POA: Diagnosis not present

## 2023-09-21 LAB — BASIC METABOLIC PANEL WITH GFR
Anion gap: 10 (ref 5–15)
BUN: 32 mg/dL — ABNORMAL HIGH (ref 8–23)
CO2: 25 mmol/L (ref 22–32)
Calcium: 8.3 mg/dL — ABNORMAL LOW (ref 8.9–10.3)
Chloride: 97 mmol/L — ABNORMAL LOW (ref 98–111)
Creatinine, Ser: 1.18 mg/dL (ref 0.61–1.24)
GFR, Estimated: >60 mL/min (ref >60)
Glucose, Bld: 143 mg/dL — ABNORMAL HIGH (ref 70–99)
Potassium: 3.6 mmol/L (ref 3.5–5.1)
Sodium: 132 mmol/L — ABNORMAL LOW (ref 135–145)

## 2023-09-21 LAB — CBC
HCT: 47.5 % (ref 39.0–52.0)
Hemoglobin: 16.5 g/dL (ref 13.0–17.0)
MCH: 32.2 pg (ref 26.0–34.0)
MCHC: 34.7 g/dL (ref 30.0–36.0)
MCV: 92.8 fL (ref 80.0–100.0)
Platelets: 235 K/uL (ref 150–400)
RBC: 5.12 MIL/uL (ref 4.22–5.81)
RDW: 12.3 % (ref 11.5–15.5)
WBC: 10.8 K/uL — ABNORMAL HIGH (ref 4.0–10.5)
nRBC: 0 % (ref 0.0–0.2)

## 2023-09-21 LAB — GLUCOSE, CAPILLARY
Glucose-Capillary: 141 mg/dL — ABNORMAL HIGH (ref 70–99)
Glucose-Capillary: 146 mg/dL — ABNORMAL HIGH (ref 70–99)
Glucose-Capillary: 171 mg/dL — ABNORMAL HIGH (ref 70–99)
Glucose-Capillary: 205 mg/dL — ABNORMAL HIGH (ref 70–99)

## 2023-09-21 LAB — COOXEMETRY PANEL
Carboxyhemoglobin: 2.3 % — ABNORMAL HIGH (ref 0.5–1.5)
Methemoglobin: 0.7 % (ref 0.0–1.5)
O2 Saturation: 83.9 %
Total hemoglobin: 16.9 g/dL — ABNORMAL HIGH (ref 12.0–16.0)

## 2023-09-21 MED ORDER — SPIRONOLACTONE 25 MG PO TABS
25.0000 mg | ORAL_TABLET | Freq: Every day | ORAL | Status: DC
  Administered 2023-09-21 – 2023-09-23 (×3): 25 mg via ORAL
  Filled 2023-09-21 (×3): qty 1

## 2023-09-21 MED ORDER — POTASSIUM CHLORIDE CRYS ER 20 MEQ PO TBCR
40.0000 meq | EXTENDED_RELEASE_TABLET | Freq: Once | ORAL | Status: AC
  Administered 2023-09-21: 40 meq via ORAL
  Filled 2023-09-21: qty 2

## 2023-09-21 NOTE — Progress Notes (Addendum)
 Advanced Heart Failure Rounding Note  Cardiologist: None  AHF MD: Dr. Rolan Chief Complaint: acute systolic heart failure, post cardiotomy shock  Patient Profile   73 y/o male admitted w/ chest pain and new systolic heart failure and LV thrombus. EF 20%, RV mildly reduced. Cath w/ 99% stenosis of LAD, now s/p CABG x 1. Post op course c/b post cardiotomy shock and ATN.    Subjective:     7/14: S/p off pump CABG x 1 LIMA-LAD. Extubated same day  7/15: worsening dyspnea w/ anuric AKI, required escalation of inotrope's. POCUS ECHO with EF 25% RV markedly dilated an severe HK. Septum flattened. Started on iNO, lasix  gtt, and dual inotropes 7/18: Off iNO + DBA. PAC pulled.  7/20: Off milrinone  7/21: Echo with EF 30-35% and mod reduced RV  POD#10  Coox 84%.  WBC 10.6>14>10.8. Afebrile. sCr 1.3>1.1>1.4>1.18  CVP <5  Feels fine this morning. Wants to sit up to eat breakfast. Has been walking in the hall multiple times a day. Denies CP.   Objective:    Weight Range: 61 kg Body mass index is 21.71 kg/m.   Vital Signs:   Temp:  [97.6 F (36.4 C)-97.9 F (36.6 C)] 97.6 F (36.4 C) (07/23 0421) Pulse Rate:  [92-102] 94 (07/23 0421) Resp:  [15-23] 19 (07/23 0421) BP: (89-119)/(60-72) 119/72 (07/23 0421) SpO2:  [93 %-96 %] 95 % (07/23 0421) Weight:  [61 kg-61.2 kg] 61 kg (07/23 0416) Last BM Date : 09/20/23  Weight change: Filed Weights   09/20/23 0500 09/20/23 2004 09/21/23 0416  Weight: 61 kg 61.2 kg 61 kg   Intake/Output:  Intake/Output Summary (Last 24 hours) at 09/21/2023 0837 Last data filed at 09/21/2023 0653 Gross per 24 hour  Intake 240 ml  Output 1100 ml  Net -860 ml    Physical Exam  General:  elderly appearing.  No respiratory difficulty Neck: JVD flat.  Cor: Regular rate & rhythm. No murmurs. Lungs: clear Extremities: no edema  Neuro: alert & oriented x 3. Affect pleasant.   Telemetry   SR in 90s (personally reviewed)  Labs    CBC Recent  Labs    09/20/23 0410 09/21/23 0503  WBC 14.0* 10.8*  HGB 17.5* 16.5  HCT 50.2 47.5  MCV 92.6 92.8  PLT 235 235   Basic Metabolic Panel Recent Labs    92/77/74 0410 09/21/23 0503  NA 131* 132*  K 3.9 3.6  CL 93* 97*  CO2 30 25  GLUCOSE 72 143*  BUN 29* 32*  CREATININE 1.40* 1.18  CALCIUM 9.6 8.3*   Medications:    Scheduled Medications:  apixaban   5 mg Oral BID   aspirin  EC  81 mg Oral Daily   bisacodyl   10 mg Oral Daily   Or   bisacodyl   10 mg Rectal Daily   Chlorhexidine  Gluconate Cloth  6 each Topical Daily   colchicine   0.6 mg Oral Daily   digoxin   0.125 mg Oral Daily   feeding supplement  237 mL Oral BID BM   insulin  aspart  0-15 Units Subcutaneous TID WC   insulin  aspart  2 Units Subcutaneous TID WC   insulin  glargine-yfgn  8 Units Subcutaneous BID   losartan   25 mg Oral Daily   pantoprazole   40 mg Oral Daily   potassium chloride   40 mEq Oral Once   spironolactone   25 mg Oral Daily   tamsulosin   0.4 mg Oral PC supper   Infusions:  PRN Medications: dextrose , ondansetron  (  ZOFRAN ) IV, mouth rinse, mouth rinse, oxyCODONE , polyethylene glycol, sodium chloride , sodium chloride  flush, traMADol   Assessment/Plan   Acute systolic CHF w/ Post Cardiotomy Shock:  - Suspect late presentation of anterior STEMI, CMR indicated some viability - Echo 09/07/23 with EF <20%, G2DD, and mildly reduced RV - CMR 09/09/23 with LVEF 21%, RVEF 25%, ischemic LGE pattern, layered mural thrombus - [facility] 09/09/23 with nomal filling pressues, mild PH, low CO 2.04, and ost LAD with severely calcified disease. No good landing zones for PCI to LAD. - s/p CABG x 1, LIMA-LAD c/b post-cardiotomy shock and ATN   - Post-op echo with EF 30-35% and mod reduced RV function - Required dual inotropes and high dose diuretics but now significantly improved. Off iNO and DBA 7/18. Off milrinone  7/20. Coox stable.  - Coox 84? 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 S/p off pump CABG 7/14. EKG with large anterior q waves. - continue ASA + statin - Continue eliquis  5 mg bid for LV thrombus - no ? blocker w/ low output  - No chest pain on exam  AKI : Suspect post operative ATN, improved with high dose diuretics. Resolved  Apical thrombus: Noted on contrast images and somewhat equivocal. Confirmed by cMRI, layered LV apical mural thrombus  - continue eliquis  5 mg bid  Acute Hypoxic Respiratory Failure - resolved  Constipation - KUB 7/21 with large stool burden, no ileus  - significant BM 7/22 after SMOG edema - continue scheduled bowel reg  Leukocytosis - 10.6>14; afebrile - CXR 7/22 showed small b/l pleural effusions; no evidence of PNA - UA (-)  Continue to ambulate    Length of Stay: 15  Beckey LITTIE Coe, NP 09/21/23, 09/21/23  Advanced Heart Failure Team Pager [phone] (M-F; 7a - 5p)  Please contact [facility] for night-coverage after hours (4p -7a ) and weekends on [email or web address]

## 2023-09-21 NOTE — Progress Notes (Addendum)
 [address] [place] [employer] [address]             [phone]      9 Days Post-Op Procedure(s) (LRB): OFF PUMP CORONARY ARTERY BYPASS GRAFTING X 1, USING LEFT INTERNAL MAMMARY ARTERY (N/A) Subjective: Patient reports he has some chest pain that is about the same as yesterday and had 1 accidental stooling this AM but states it is not diarrhea. He states he walked several times yesterday.   Objective: Vital signs in last 24 hours: Temp:  [97.6 F (36.4 C)-97.9 F (36.6 C)] 97.6 F (36.4 C) (07/23 0421) Pulse Rate:  [92-102] 94 (07/23 0421) Cardiac Rhythm: Sinus tachycardia (07/22 2000) Resp:  [13-23] 19 (07/23 0421) BP: (89-119)/(60-72) 119/72 (07/23 0421) SpO2:  [93 %-96 %] 95 % (07/23 0421) Weight:  [61 kg-61.2 kg] 61 kg (07/23 0416)  Hemodynamic parameters for last 24 hours: CVP:  [1 mmHg-6 mmHg] 3 mmHg  Intake/Output from previous day: 07/22 0701 - 07/23 0700 In: 240 [P.O.:240] Out: 1100 [Urine:1100] Intake/Output this shift: No intake/output data recorded.  General appearance: alert, cooperative, and no distress Neurologic: intact Heart: regular rate and rhythm, no murmur, pericardial friction rub Lungs: diminished bibasilar breath sounds Abdomen: soft, non-tender; bowel sounds normal; no masses,  no organomegaly Extremities: extremities normal, atraumatic, no cyanosis or edema Wound: Clean and dry without sign of infection, right arm with ecchymosis  Lab Results: Recent Labs    09/20/23 0410 09/21/23 0503  WBC 14.0* 10.8*  HGB 17.5* 16.5  HCT 50.2 47.5  PLT 235 235   BMET:  Recent Labs    09/20/23 0410 09/21/23 0503  NA 131* 132*  K 3.9 3.6  CL 93* 97*  CO2 30 25  GLUCOSE 72 143*  BUN 29* 32*  CREATININE 1.40* 1.18  CALCIUM 9.6 8.3*    PT/INR: No results for input(s): LABPROT, INR in the last 72 hours. ABG    Component Value Date/Time   PHART 7.524 (H) 09/16/2023 1231   HCO3 30.6 (H) 09/16/2023 1231   TCO2 32  09/16/2023 1231   ACIDBASEDEF 6.0 (H) 09/12/2023 1606   O2SAT 83.9 09/21/2023 0503   CBG (last 3)  Recent Labs    09/20/23 1102 09/20/23 1611 09/21/23 0619  GLUCAP 160* 95 146*    Assessment/Plan: S/P Procedure(s) (LRB): OFF PUMP CORONARY ARTERY BYPASS GRAFTING X 1, USING LEFT INTERNAL MAMMARY ARTERY (N/A)  CV: Suspected missed LAD STEMI. No plavix, on Eliquis  for LV thrombus. No BB due to low output per AHF team. Acute on chronic CHF, AHF team following and titrating GDMT. On Cozaar  25mg  daily and spironolactone  25mg  daily. Jardiance  held, Digoxin  0.125mg  started yesterday. SBP 95-119. NSR, HR 90s. Coox 83 this AM. Echo 07/21 LVEF 30-35%. Pericardial rub on exam, no ST elevation or sign of pericarditis on EKG, started on colchicine  0.6mg  daily.    Pulm: Saturating well on RA. CXR 07/22 with small left pleural effusion, trace right pleural effusion and bibasilar atelectasis with streaky left lower lobe atelectasis. Encourage IS and ambulation.   GI: +BM after sorbitol , tolerating a diet. Accidental soft stooling this AM but reports it was not diarrhea, will d/c colace, continue dulcolax daily and miralax  prn.   Endo: T2DM, preop A1C 7.2. CBGs 160/95/146 better controlled on SSI, Semglee  8U BID and Novolog  2U with meals. On Metformin at home. Will restart closer to discharge.   Renal: AKI resolved Cr 1.18 today after the discontinuation of  Jardiance . UO 1100cc/24hrs. At preop weight. Continue to hold Lasix . K 3.6, supplement.    ID: Leukocytosis, WBC 10.8 today down from 14. Afebrile. No UTI on UA. No sign of pneumonia on CXR. Wounds are clean without sign of infection. Was started on colchicine  for possible pericarditis. PICC still in place.   DVT Prophylaxis: On Eliquis  5mg  BID and ASA 81mg  daily for LV thrombus   Deconditioning: Potential CIR admit, CIR is following. Insurance authorization pending. Continue PT/OT.    Dispo: Hopefully can discharge to CIR later this week if  insurance approves     LOS: 15 days    Con GORMAN Bend, PA-C 09/21/2023   Chart reviewed, patient examined, agree with above.  He is doing well overall. Wt is at preop and no sign of volume overload. Co-ox is good. DC CVP and Co-ox.  Continue IS, ambulation.

## 2023-09-21 NOTE — Progress Notes (Signed)
 Nutrition Follow-up  DOCUMENTATION CODES:  Severe malnutrition in context of chronic illness  INTERVENTION:  Continue small frequent snacks daily, as tolerated Ensure Plus High Protein po BID, each supplement provides 350 kcal and 20 grams of protein. Continue MVI with minerals daily  NUTRITION DIAGNOSIS:  Severe Malnutrition related to chronic illness as evidenced by moderate fat depletion, severe fat depletion, moderate muscle depletion, severe muscle depletion. - remains applicable  GOAL:  Patient will meet greater than or equal to 90% of their needs - progressing  MONITOR:   PO intake, Supplement acceptance, Labs, Weight trends, Skin, I & O's  REASON FOR ASSESSMENT:   Consult Diet education  ASSESSMENT:   Admit w/ acute on chronic CHF, fluid overload, DM2, HTN. Came to ED after 2 wks increasing fatigue, edema, SOB.  7/08 Admitted 7/14 CABG x 1, Extubated post op 7/15 Worsening dyspnea with anuric AKI, POCUS ECHO with EF 25%, RV markedly dilated. Started on iNO, high dose lasix   7/18 off iNO and DBA  Noted plan for likely discharge to CIR later this week pending insurance approval.   No documented meal completions to review since last RD follow up.  Spoke with pt at bedside. He endorses having a fair appetite. He reports that he prefers to eat a late breakfast, small lunch and bigger dinner. He has been consuming 1 ensure daily with snacks.   Pt mentions that he has been having more loose stools since transferring to the floor. Suspect this is r/t prior constipation and having received significant bowel regimen to improve this. Will continue to monitor.   Weights within the last week have remained stable between 60-61 kg.  Medications: dulcolax daily, SSI 0-15 units TID, novolog  2 units TID, semglee  8 units BID  Labs:  Sodium 132 BUN 32 CBG's 95-160 x24 hours  Diet Order:   Diet Order             Diet Heart Room service appropriate? Yes; Fluid consistency:  Thin  Diet effective now                   EDUCATION NEEDS:   Education needs have been addressed  Skin:  Skin Assessment: Reviewed RN Assessment  Last BM:  7/23 type 4 medium  Height:   Ht Readings from Last 1 Encounters:  09/12/23 5' 6 (1.676 m)    Weight:   Wt Readings from Last 1 Encounters:  09/21/23 61 kg   BMI:  Body mass index is 21.71 kg/m.  Estimated Nutritional Needs:   Kcal:  1900-2100 kcals  Protein:  80-90 g  Fluid:  1.8 L  Royce Maris, RDN, LDN Clinical Nutrition See AMiON for contact information.

## 2023-09-21 NOTE — Progress Notes (Signed)
 Inpatient Rehab Admissions Coordinator:    Continue to await decision on appeal for CIR. Pt. Updated.  Leita Kleine, MS, CCC-SLP Rehab Admissions Coordinator  [phone] (celll) [phone] (office)

## 2023-09-21 NOTE — Care Management Important Message (Signed)
 Important Message  Patient Details  Name: Joel Pitts MRN: [id number] Date of Birth: [birth year]   Important Message Given:  Yes - Medicare IM     Claretta Deed 09/21/2023, 3:00 PM

## 2023-09-21 NOTE — Progress Notes (Signed)
 Physical Therapy Treatment Patient Details Name: Joel Pitts MRN: [id number] DOB: August 31, 1950 Today's Date: 09/21/2023   History of Present Illness 73 yo male admitted 09/06/23 w/ chest pain, new systolic heart failure and LV thrombus. 7/14 s/p CABG x1. Post op course complicated by post cardiotomy shock and ATN. PMHx: T2DM, BPH, HTN.    PT Comments  Pt received in supine, agreeable to therapy session after being given time to eat his dinner, pt given extensive instruction on sternal precs and supine BLE exercises for strengthening prior to transfer and gait training. Pt gait distance limited due to bowel urgency with pt needing >5 minutes in bathroom, RN/NT notified to check on him and pt agreeable to pull call bell in bathroom prior to getting up. PTA reinforce sternal precs with pt, esp for peri-care and NT also notified. Patient will benefit from intensive inpatient follow-up therapy, >3 hours/day.    If plan is discharge home, recommend the following: Assistance with cooking/housework;Assist for transportation;Help with stairs or ramp for entrance;A little help with walking and/or transfers;A little help with bathing/dressing/bathroom   Can travel by private vehicle        Equipment Recommendations  Rolling walker (2 wheels);BSC/3in1    Recommendations for Other Services Rehab consult     Precautions / Restrictions Precautions Precautions: Fall;Sternal Precaution Booklet Issued: Yes (comment) Recall of Precautions/Restrictions: Impaired Precaution/Restrictions Comments: educated for precautions but needs frequent reminders Restrictions Weight Bearing Restrictions Per Provider Order: No Other Position/Activity Restrictions: Sternal Precautions     Mobility  Bed Mobility Overal bed mobility: Needs Assistance Bed Mobility: Rolling, Sidelying to Sit Rolling: Modified independent (Device/Increase time) Sidelying to sit: Mod assist       General bed mobility comments:  exiting on the L side with pillow pt with pad used to help progress hips to eob. pt winging L elbow out to push up with log roll to L EOB, needs verbal and tactile cues to prevent this, but pt not receptive to some cues about technique. Reinforced use of momentum rather than arms when scooting to foot flat. Needs modA to prevent WB through L elbow outstretched toward mattress when pushing upright.    Transfers Overall transfer level: Needs assistance Equipment used: None Transfers: Sit to/from Stand Sit to Stand: Contact guard assist           General transfer comment: from EOB, RW upon standing, and to toilet with hands on his knees and elbows tucked in with min safety cues.    Ambulation/Gait Ambulation/Gait assistance: Contact guard assist Gait Distance (Feet): 20 Feet Assistive device: Rolling walker (2 wheels) Gait Pattern/deviations: Step-through pattern, Decreased stride length Gait velocity: decreased Gait velocity interpretation: <1.8 ft/sec, indicate of risk for recurrent falls   General Gait Details: cues for posture and sternal precs, pt with good RW management but distance limited due to pt bowel urgency.   Stairs Stairs:  (handout provided for step with RW but not able to perform today due to need for toileting >5 mins)           Wheelchair Mobility     Tilt Bed    Modified Rankin (Stroke Patients Only)       Balance Overall balance assessment: Mild deficits observed, not formally tested Sitting-balance support: No upper extremity supported, Feet supported Sitting balance-Leahy Scale: Good     Standing balance support: No upper extremity supported, During functional activity Standing balance-Leahy Scale: Poor Standing balance comment: RW  Communication Communication Communication: No apparent difficulties  Cognition Arousal: Alert Behavior During Therapy: WFL for tasks assessed/performed   PT - Cognitive  impairments: Problem solving                       PT - Cognition Comments: pt needs reminders during most functional tasks to comply with sternal precs. Pt seeming unaware he's not supposed to reach behind his back during self-care tasks although this was recently reviewed via handout prior to OOB. NT notified to reinforce this precaution with him as well. Following commands: Intact      Cueing Cueing Techniques: Verbal cues, Gestural cues  Exercises General Exercises - Lower Extremity Ankle Circles/Pumps: AROM, Both, 10 reps, Supine Quad Sets: AROM, Both, Supine (a few reps for teachback) Gluteal Sets: AROM, Supine (a few reps for teachback) Short Arc Quad:  (visual/verbal demo) Long Arc Quad:  (visual/verbal demo) Heel Slides: AROM, Both, Supine (a few reps) Straight Leg Raises:  (visual/verbal demo, pt defers)    General Comments General comments (skin integrity, edema, etc.): Pt bed pad soiled with multiple smears, RN/NT notified pt requesting increased time on toilet and will need assist for peri-care due to sternal precs, PTA also discussed with patient      Pertinent Vitals/Pain Pain Assessment Pain Assessment: Faces Faces Pain Scale: Hurts a little bit Pain Location: sacrum/incisional Pain Descriptors / Indicators: Discomfort, Guarding Pain Intervention(s): Monitored during session, Repositioned    Home Living                          Prior Function            PT Goals (current goals can now be found in the care plan section) Acute Rehab PT Goals Patient Stated Goal: Get well, return to gardening and yard work. PT Goal Formulation: With patient Time For Goal Achievement: 09/30/23 Progress towards PT goals: Progressing toward goals    Frequency    Min 2X/week      PT Plan      Co-evaluation              AM-PAC PT 6 Clicks Mobility   Outcome Measure  Help needed turning from your back to your side while in a flat bed without  using bedrails?: A Little Help needed moving from lying on your back to sitting on the side of a flat bed without using bedrails?: A Lot Help needed moving to and from a bed to a chair (including a wheelchair)?: A Little Help needed standing up from a chair using your arms (e.g., wheelchair or bedside chair)?: A Little Help needed to walk in hospital room?: A Little Help needed climbing 3-5 steps with a railing? : Total 6 Click Score: 15    End of Session Equipment Utilized During Treatment: Gait belt Activity Tolerance: Treatment limited secondary to medical complications (Comment);Other (comment) (bowel urgency and increased time in bathroom limiting session time) Patient left: with call bell/phone within reach;Other (comment) (on toilet, RN/NT aware to check on him in ~5 mins) Nurse Communication: Mobility status;Precautions;Other (comment) (pt needs assist with peri-care after toileting) PT Visit Diagnosis: Other abnormalities of gait and mobility (R26.89);Muscle weakness (generalized) (M62.81);Difficulty in walking, not elsewhere classified (R26.2)     Time: 8343-8286 PT Time Calculation (min) (ACUTE ONLY): 17 min  Charges:    $Therapeutic Activity: 8-22 mins PT General Charges $$ ACUTE PT VISIT: 1 Visit  Connell SQUIBB., PTA Acute Rehabilitation Services Secure Chat Preferred 9a-5:30pm Office: [phone]    Connell CHRISTELLA Blue 09/21/2023, 5:29 PM

## 2023-09-21 NOTE — Progress Notes (Signed)
 CARDIAC REHAB PHASE I     Post OHS education including site care, restrictions, heart healthy diabetic diet, sternal precautions, continued IS use, home needs, exercise guidelines and CRP2 reviewed. All questions and concerns addressed. Will refer to [facility] for CRP2. Pending discharge to CIR.   [id number] Vaughn Asberry Hacking, RN BSN 09/21/2023 12:26 PM

## 2023-09-21 NOTE — Progress Notes (Signed)
 Mobility Specialist Progress Note;   09/21/23 0858  Mobility  Activity Ambulated with assistance in hallway  Level of Assistance Standby assist, set-up cues, supervision of patient - no hands on  Assistive Device Front wheel walker  Distance Ambulated (ft) 200 ft  RUE Weight Bearing Per Provider Order NWB  LUE Weight Bearing Per Provider Order NWB  Activity Response Tolerated well  Mobility Referral Yes  Mobility visit 1 Mobility  Mobility Specialist Start Time (ACUTE ONLY) [id number]  Mobility Specialist Stop Time (ACUTE ONLY) [id number]  Mobility Specialist Time Calculation (min) (ACUTE ONLY) 23 min    Pre-mobility: BP 98/62 (73)  Pt agreeable to mobility. Required no physical assistance during ambulation, SV. Cues for sternal precautions. No c/o during session. Pt deferred sitting in recliner at [facility]. Pt returned back to bed and left with all needs met, call bell in reach. Alarm on.   Lauraine Erm Mobility Specialist Please contact via SecureChat or [employer] [phone]

## 2023-09-21 NOTE — H&P (Incomplete)
 Physical Medicine and Rehabilitation Admission H&P    CC: Functional deficits due to debility.   HPI: Joel Pitts is a 73 year old male wit history of T2DM and nephrolithiasis who was as admitted on 09/06/23 with 2 week history of SOB with fatigue, chest pressure with difficulty lying flat and BLE edema and found to have elevated D dimer with bilateral pleural effusions due to acute systolic HF.  CT chest negative for PE. He was started on IV diuresis and Dr. Rolan consulted for input. 2 D echo showed EF <20% with global hypokinesis, possible layering thrombus and moderately dilated LA. He was started on IV heparin  due to possible thrombus as well as GDMT. Cardiac cath showed ostial LAD disease with 99% stenosis distal LM to proximal LAD and MRI viability study ordered.  Cardiac MRI showed normal LV size with LAD disease, EF 21%, elevated myocardial fibrotic content and layered LV apical mural thrombus with significant viability of LAD territory. Dr.Lightfoot consulted and patient underwent CABG X 1 LIMA LAD on 07/14.   Post op extubated without difficulty and low put cardiogenic shock treated with dobutamine  and Epi and milrinone  added for diuresis. AKI felt to be due to ATN and HF team following for fluid overload. He did develop SOB requiring HFNC management and started on inotrope's and high dose lasix  gtt.  He was weaned off pressors, GDMT resumed and was transitioned to Eliquis . Noted to have rise in WBC, reports of chest burning 07/21 overnight and noted of pericardial rub. Colchicine  added for concerns of pericarditis 7/22.   Lives alone and independent PTA. Eats out. Did not use AD and has friends who can check in on him.    ROS   Past Medical History:  Diagnosis Date   Diabetes mellitus without complication (HCC)    History of kidney stones     Past Surgical History:  Procedure Laterality Date   CORONARY ARTERY BYPASS GRAFT N/A 09/12/2023   Procedure: OFF PUMP CORONARY  ARTERY BYPASS GRAFTING X 1, USING LEFT INTERNAL MAMMARY ARTERY;  Surgeon: Shyrl Linnie KIDD, MD;  Location: [facility] OR;  Service: Open Heart Surgery;  Laterality: N/A;   LITHOTRIPSY     RIGHT/LEFT HEART CATH AND CORONARY ANGIOGRAPHY N/A 09/09/2023   Procedure: RIGHT/LEFT HEART CATH AND CORONARY ANGIOGRAPHY;  Surgeon: Rolan Ezra RAMAN, MD;  Location: [facility] INVASIVE CV LAB;  Service: Cardiovascular;  Laterality: N/A;    History reviewed. No pertinent family history.   Social History:  reports that he has never smoked. He has never used smokeless tobacco. He reports current alcohol use of about 7.0 standard drinks of alcohol per week. He reports that he does not use drugs.   Allergies  Allergen Reactions   Statins Diarrhea   Medications Prior to Admission  Medication Sig Dispense Refill   aspirin  81 MG tablet Take 81 mg by mouth daily.     lisinopril  (PRINIVIL ,ZESTRIL ) 5 MG tablet Take 5 mg by mouth every evening.      metFORMIN (GLUCOPHAGE) 500 MG tablet Take 500 mg by mouth 2 (two) times daily with a meal.     silodosin (RAPAFLO) 8 MG CAPS capsule Take 8 mg by mouth daily.     zolpidem (AMBIEN) 10 MG tablet Take 10 mg by mouth at bedtime.        Home: Home Living Family/patient expects to be discharged to:: Private residence Living Arrangements: Alone Available Help at Discharge: Family, Friend(s), Available PRN/intermittently Type of Home: House Home Access:  Stairs to enter [employer] of Steps: 2 Entrance Stairs-Rails: None Home Layout: One level Bathroom Shower/Tub: Engineer, manufacturing systems: Handicapped height Bathroom Accessibility: Yes Home Equipment: Agricultural consultant (2 wheels), Hand held shower head, Grab bars - tub/shower   Functional History: Prior Function Prior Level of Function : Independent/Modified Independent, Driving Mobility Comments: Independent, not using RW, Active in garden/lawn care ADLs Comments: ind, works outdoors in his  yard.  Functional Status:  Mobility: Bed Mobility Overal bed mobility: Needs Assistance Bed Mobility: Supine to Sit, Sit to Supine Rolling: Modified independent (Device/Increase time) Sidelying to sit: Min assist Supine to sit: Min assist Sit to supine: Mod assist General bed mobility comments: exiting on the L side with pillow pt with pad used to help progress hips to eob. pt reaching for therapist and cued not to pull. pt with (A) to place bil LE back on bed surface Transfers Overall transfer level: Needs assistance Equipment used: None Transfers: Sit to/from Stand Sit to Stand: Contact guard assist Bed to/from chair/wheelchair/BSC transfer type:: Step pivot Step pivot transfers: Min assist General transfer comment: CGA to rise from bed and toilet. Pt able to perform 6 repeated sit to stands from EOB and x 2 from toilet with cues for hand placement Ambulation/Gait Ambulation/Gait assistance: Contact guard assist Gait Distance (Feet): 400 Feet Assistive device: Rolling walker (2 wheels) Gait Pattern/deviations: Step-through pattern, Decreased stride length General Gait Details: cues for posture and breathing technique with pt maintaining >90% on RA with max HR 105 Pre-gait activities: Weight shift, static march, two steps forward and backwards with min assist for balance, leaning posteriorly.    ADL: ADL Overall ADL's : Needs assistance/impaired Eating/Feeding: Independent Grooming: Wash/dry face, Wash/dry hands, Oral care, Set up, Sitting Lower Body Bathing Details (indicate cue type and reason): q Toilet Transfer: Contact guard assist, Ambulation, Regular Toilet, Rolling walker (2 wheels), Cueing for safety Toileting- Clothing Manipulation and Hygiene: Maximal assistance, Sit to/from stand (cueing for sternal precautions) Functional mobility during ADLs: Contact guard assist, Rolling walker (2 wheels)  Cognition: Cognition Orientation Level: Oriented  X4 Cognition Arousal: Alert Behavior During Therapy: WFL for tasks assessed/performed  Physical Exam: Blood pressure 90/72, pulse 95, temperature 97.7 F (36.5 C), temperature source Oral, resp. rate 20, height 5' 6 (1.676 m), weight 61 kg, SpO2 98%. Physical Exam  Results for orders placed or performed during the hospital encounter of 09/06/23 (from the past 48 hours)  Glucose, capillary     Status: Abnormal   Collection Time: 09/19/23  4:18 PM  Result Value Ref Range   Glucose-Capillary 139 (H) 70 - 99 mg/dL    Comment: Glucose reference range applies only to samples taken after fasting for at least 8 hours.  Glucose, capillary     Status: Abnormal   Collection Time: 09/19/23  8:06 PM  Result Value Ref Range   Glucose-Capillary 184 (H) 70 - 99 mg/dL    Comment: Glucose reference range applies only to samples taken after fasting for at least 8 hours.  Glucose, capillary     Status: Abnormal   Collection Time: 09/19/23 11:26 PM  Result Value Ref Range   Glucose-Capillary 238 (H) 70 - 99 mg/dL    Comment: Glucose reference range applies only to samples taken after fasting for at least 8 hours.  Glucose, capillary     Status: None   Collection Time: 09/20/23  3:50 AM  Result Value Ref Range   Glucose-Capillary 70 70 - 99 mg/dL    Comment: Glucose  reference range applies only to samples taken after fasting for at least 8 hours.  CBC     Status: Abnormal   Collection Time: 09/20/23  4:10 AM  Result Value Ref Range   WBC 14.0 (H) 4.0 - 10.5 K/uL   RBC 5.42 4.22 - 5.81 MIL/uL   Hemoglobin 17.5 (H) 13.0 - 17.0 g/dL   HCT 49.7 60.9 - 47.9 %   MCV 92.6 80.0 - 100.0 fL   MCH 32.3 26.0 - 34.0 pg   MCHC 34.9 30.0 - 36.0 g/dL   RDW 87.4 88.4 - 84.4 %   Platelets 235 150 - 400 K/uL   nRBC 0.0 0.0 - 0.2 %    Comment: Performed at [facility], [address], [place], [place] [address]  Basic metabolic panel     Status: Abnormal   Collection Time: 09/20/23  4:10 AM  Result  Value Ref Range   Sodium 131 (L) 135 - 145 mmol/L   Potassium 3.9 3.5 - 5.1 mmol/L   Chloride 93 (L) 98 - 111 mmol/L   CO2 30 22 - 32 mmol/L   Glucose, Bld 72 70 - 99 mg/dL    Comment: Glucose reference range applies only to samples taken after fasting for at least 8 hours.   BUN 29 (H) 8 - 23 mg/dL   Creatinine, Ser 8.59 (H) 0.61 - 1.24 mg/dL   Calcium 9.6 8.9 - 89.6 mg/dL   GFR, Estimated 53 (L) >60 mL/min    Comment: (NOTE) Calculated using the CKD-EPI Creatinine Equation (2021)    Anion gap 8 5 - 15    Comment: Performed at [facility], [address] [place], [place] [address]  Cooxemetry Panel (carboxy, met, total hgb, O2 sat)     Status: Abnormal   Collection Time: 09/20/23  4:11 AM  Result Value Ref Range   Total hemoglobin 18.0 (H) 12.0 - 16.0 g/dL   O2 Saturation 44.6 %   Carboxyhemoglobin 1.7 (H) 0.5 - 1.5 %   Methemoglobin <0.7 0.0 - 1.5 %    Comment: Performed at [facility], [address], [place], [place] [address]  Glucose, capillary     Status: Abnormal   Collection Time: 09/20/23  7:42 AM  Result Value Ref Range   Glucose-Capillary 244 (H) 70 - 99 mg/dL    Comment: Glucose reference range applies only to samples taken after fasting for at least 8 hours.  Glucose, capillary     Status: Abnormal   Collection Time: 09/20/23 11:02 AM  Result Value Ref Range   Glucose-Capillary 160 (H) 70 - 99 mg/dL    Comment: Glucose reference range applies only to samples taken after fasting for at least 8 hours.  Urinalysis, w/ Reflex to Culture (Infection Suspected) -Urine, Catheterized     Status: Abnormal   Collection Time: 09/20/23 11:30 AM  Result Value Ref Range   Specimen Source URINE, CATHETERIZED    Color, Urine YELLOW YELLOW   APPearance CLEAR CLEAR   Specific Gravity, Urine 1.018 1.005 - 1.030   pH 7.0 5.0 - 8.0   Glucose, UA >=500 (A) NEGATIVE mg/dL   Hgb urine dipstick SMALL (A) NEGATIVE   Bilirubin Urine NEGATIVE NEGATIVE   Ketones, ur  NEGATIVE NEGATIVE mg/dL   Protein, ur 30 (A) NEGATIVE mg/dL   Nitrite NEGATIVE NEGATIVE   Leukocytes,Ua NEGATIVE NEGATIVE   RBC / HPF 0-5 0 - 5 RBC/hpf   WBC, UA 0-5 0 - 5 WBC/hpf  Comment:        Reflex urine culture not performed if WBC <=10, OR if Squamous epithelial cells >5. If Squamous epithelial cells >5 suggest recollection.    Bacteria, UA NONE SEEN NONE SEEN   Squamous Epithelial / HPF 0-5 0 - 5 /HPF    Comment: Performed at [facility], [address], [place], [place] [address]  Cooxemetry Panel (carboxy, met, total hgb, O2 sat)     Status: Abnormal   Collection Time: 09/20/23  4:05 PM  Result Value Ref Range   Total hemoglobin 16.8 (H) 12.0 - 16.0 g/dL   O2 Saturation 26.4 %   Carboxyhemoglobin 1.6 (H) 0.5 - 1.5 %   Methemoglobin <0.7 0.0 - 1.5 %    Comment: Performed at [facility], [address], [place], [place] [address]  Glucose, capillary     Status: None   Collection Time: 09/20/23  4:11 PM  Result Value Ref Range   Glucose-Capillary 95 70 - 99 mg/dL    Comment: Glucose reference range applies only to samples taken after fasting for at least 8 hours.  CBC     Status: Abnormal   Collection Time: 09/21/23  5:03 AM  Result Value Ref Range   WBC 10.8 (H) 4.0 - 10.5 K/uL   RBC 5.12 4.22 - 5.81 MIL/uL   Hemoglobin 16.5 13.0 - 17.0 g/dL   HCT 52.4 60.9 - 47.9 %   MCV 92.8 80.0 - 100.0 fL   MCH 32.2 26.0 - 34.0 pg   MCHC 34.7 30.0 - 36.0 g/dL   RDW 87.6 88.4 - 84.4 %   Platelets 235 150 - 400 K/uL   nRBC 0.0 0.0 - 0.2 %    Comment: Performed at [facility], [address], [place], [place] [address]  Cooxemetry Panel (carboxy, met, total hgb, O2 sat)     Status: Abnormal   Collection Time: 09/21/23  5:03 AM  Result Value Ref Range   Total hemoglobin 16.9 (H) 12.0 - 16.0 g/dL   O2 Saturation 16.0 %   Carboxyhemoglobin 2.3 (H) 0.5 - 1.5 %   Methemoglobin 0.7 0.0 - 1.5 %    Comment: Performed at [facility], [address], [place], [place] [address]  Basic metabolic panel     Status: Abnormal   Collection Time: 09/21/23  5:03 AM  Result Value Ref Range   Sodium 132 (L) 135 - 145 mmol/L   Potassium 3.6 3.5 - 5.1 mmol/L   Chloride 97 (L) 98 - 111 mmol/L   CO2 25 22 - 32 mmol/L   Glucose, Bld 143 (H) 70 - 99 mg/dL    Comment: Glucose reference range applies only to samples taken after fasting for at least 8 hours.   BUN 32 (H) 8 - 23 mg/dL   Creatinine, Ser 8.81 0.61 - 1.24 mg/dL   Calcium 8.3 (L) 8.9 - 10.3 mg/dL   GFR, Estimated >39 >39 mL/min    Comment: (NOTE) Calculated using the CKD-EPI Creatinine Equation (2021)    Anion gap 10 5 - 15    Comment: Performed at [facility], [address] [place], [place] [address]  Glucose, capillary     Status: Abnormal   Collection Time: 09/21/23  6:19 AM  Result Value Ref Range   Glucose-Capillary 146 (H) 70 - 99 mg/dL    Comment: Glucose reference range applies only to samples taken after fasting for at least 8 hours.  DG Chest 2 View Result Date: 09/20/2023 CLINICAL DATA:  Postop CABG. EXAM: CHEST - 2 VIEW COMPARISON:  Radiographs 09/14/2023, 09/13/2023 and 09/12/2023. CT 09/06/2023. FINDINGS: Interval removal of the Swan-Ganz catheter, mediastinal drains and left chest tube. Left arm PICC has been placed, projecting to the superior cavoatrial junction. The heart size and mediastinal contours are stable post median sternotomy. There is improved aeration of both lung bases with mild bibasilar atelectasis and small bilateral pleural effusions. No pneumothorax. Unchanged midthoracic compression deformity. IMPRESSION: Interval removal of support system with improved aeration of both lung bases. Small bilateral pleural effusions and mild bibasilar atelectasis. Electronically Signed   By: Elsie Perone M.D.   On: 09/20/2023 09:32   ECHOCARDIOGRAM LIMITED Result Date: 09/19/2023    ECHOCARDIOGRAM LIMITED REPORT   Patient Name:   Joel Pitts Date of Exam:  09/19/2023 Medical Rec #:  [id number]        Height:       66.0 in Accession #:    [id number]       Weight:       134.9 lb Date of Birth:  [birth year]        BSA:          1.692 m Patient Age:    73 years         BP:           115/77 mmHg Patient Gender: M                HR:           94 bpm. Exam Location:  Inpatient Procedure: Limited Echo, Color Doppler and Cardiac Doppler (Both Spectral and            Color Flow Doppler were utilized during procedure). Indications:    I50.9* Heart failure (unspecified)  History:        Patient has prior history of Echocardiogram examinations, most                 recent 09/15/2023. CHF, CAD, Prior CABG; Risk                 Factors:Hypertension and Diabetes.  Sonographer:    Damien Senior RDCS Referring Phys: [id number] [place] LEE  Sonographer Comments: Limited 4 days post CABG IMPRESSIONS  1. No left ventricular thrombus is seen. Left ventricular ejection fraction, by estimation, is 30 to 35%. The left ventricle has moderately decreased function. The left ventricle demonstrates regional wall motion abnormalities (see scoring diagram/findings for description). There is moderate dyskinesis of the left ventricular, apical apical segment. There is severe hypokinesis of the left ventricular, entire anteroseptal wall and anterior wall. There is moderate hypokinesis of the left ventricular, entire inferoseptal wall.  2. Right ventricular systolic function is moderately reduced. Tricuspid regurgitation signal is inadequate for assessing PA pressure.  3. The mitral valve is normal in structure. Trivial mitral valve regurgitation.  4. The aortic valve is tricuspid. Aortic valve regurgitation is not visualized.  5. The inferior vena cava is normal in size with greater than 50% respiratory variability, suggesting right atrial pressure of 3 mmHg. Comparison(s): Prior images reviewed side by side. There is some improvement in left ventricular function, mostly due to recruitment in the anterolateral  wall. FINDINGS  Left Ventricle: No left ventricular thrombus is seen. Left ventricular ejection fraction, by estimation, is 30 to 35%. The left ventricle has moderately decreased function. The left ventricle demonstrates regional wall motion abnormalities. Moderate dyskinesis of the left ventricular,  apical apical segment. Severe hypokinesis of the left ventricular, entire anteroseptal wall and anterior wall. Moderate hypokinesis of the left ventricular, entire inferoseptal wall. Abnormal (paradoxical) septal motion consistent with post-operative status.  LV Wall Scoring: The apex is dyskinetic. The apical lateral segment, apical septal segment, apical anterior segment, and apical inferior segment are akinetic. The anterior wall, anterior septum, mid inferoseptal segment, mid inferior segment, and basal inferoseptal segment are hypokinetic. The antero-lateral wall, posterior wall, and basal inferior segment are normal. Right Ventricle: Right ventricular systolic function is moderately reduced. Tricuspid regurgitation signal is inadequate for assessing PA pressure. Mitral Valve: The mitral valve is normal in structure. Trivial mitral valve regurgitation. Tricuspid Valve: The tricuspid valve is normal in structure. Tricuspid valve regurgitation is trivial. Aortic Valve: The aortic valve is tricuspid. Aortic valve regurgitation is not visualized. Pulmonic Valve: The pulmonic valve was normal in structure. Pulmonic valve regurgitation is mild. Venous: The inferior vena cava is normal in size with greater than 50% respiratory variability, suggesting right atrial pressure of 3 mmHg. Additional Comments: Spectral Doppler performed. Color Doppler performed.   LV Volumes (MOD) LV vol d, MOD A2C: 88.1 ml LV vol d, MOD A4C: 121.0 ml LV vol s, MOD A2C: 62.6 ml LV vol s, MOD A4C: 81.4 ml LV SV MOD A2C:     25.5 ml LV SV MOD A4C:     121.0 ml LV SV MOD BP:      31.5 ml RIGHT VENTRICLE RV S prime:     7.18 cm/s TAPSE (M-mode): 1.1  cm AORTIC VALVE LVOT Vmax:   63.90 cm/s LVOT Vmean:  40.500 cm/s LVOT VTI:    0.098 m  SHUNTS Systemic VTI: 0.10 m Mihai Croitoru MD Electronically signed by Jerel Balding MD Signature Date/Time: 09/19/2023/2:21:53 PM    Final       Blood pressure 90/72, pulse 95, temperature 97.7 F (36.5 C), temperature source Oral, resp. rate 20, height 5' 6 (1.676 m), weight 61 kg, SpO2 98%.  Medical Problem List and Plan: 1. Functional deficits secondary to ***  -patient may *** shower  -ELOS/Goals: *** 2.  Apical thrombus/Antithrombotics: -DVT/anticoagulation:  {VTE PROPHYLAXIS/ANTICOAGULATION - UBEZ:[id number]}  -antiplatelet therapy: *** 3. Pain Management: *** 4. Mood/Behavior/Sleep: ***  -antipsychotic agents: *** 5. Neuropsych/cognition: This patient *** capable of making decisions on *** own behalf. 6. Skin/Wound Care: *** 7. Fluids/Electrolytes/Nutrition: ***  CABG LIMA-LAD: Sternal precautions.   Acute systolic Heart failure:  ATN:   Constipation: Leucocytosis:    ***  Sharlet GORMAN Schmitz, PA-C 09/21/2023

## 2023-09-22 DIAGNOSIS — I5023 Acute on chronic systolic (congestive) heart failure: Secondary | ICD-10-CM | POA: Diagnosis not present

## 2023-09-22 LAB — CBC
HCT: 47.6 % (ref 39.0–52.0)
Hemoglobin: 16.4 g/dL (ref 13.0–17.0)
MCH: 32 pg (ref 26.0–34.0)
MCHC: 34.5 g/dL (ref 30.0–36.0)
MCV: 92.8 fL (ref 80.0–100.0)
Platelets: 255 K/uL (ref 150–400)
RBC: 5.13 MIL/uL (ref 4.22–5.81)
RDW: 12.4 % (ref 11.5–15.5)
WBC: 9.8 K/uL (ref 4.0–10.5)
nRBC: 0 % (ref 0.0–0.2)

## 2023-09-22 LAB — BASIC METABOLIC PANEL WITH GFR
Anion gap: 11 (ref 5–15)
BUN: 26 mg/dL — ABNORMAL HIGH (ref 8–23)
CO2: 24 mmol/L (ref 22–32)
Calcium: 8.9 mg/dL (ref 8.9–10.3)
Chloride: 97 mmol/L — ABNORMAL LOW (ref 98–111)
Creatinine, Ser: 1.23 mg/dL (ref 0.61–1.24)
GFR, Estimated: >60 mL/min (ref >60)
Glucose, Bld: 192 mg/dL — ABNORMAL HIGH (ref 70–99)
Potassium: 4.3 mmol/L (ref 3.5–5.1)
Sodium: 132 mmol/L — ABNORMAL LOW (ref 135–145)

## 2023-09-22 LAB — GLUCOSE, CAPILLARY
Glucose-Capillary: 150 mg/dL — ABNORMAL HIGH (ref 70–99)
Glucose-Capillary: 131 mg/dL — ABNORMAL HIGH (ref 70–99)
Glucose-Capillary: 239 mg/dL — ABNORMAL HIGH (ref 70–99)
Glucose-Capillary: 146 mg/dL — ABNORMAL HIGH (ref 70–99)

## 2023-09-22 MED ORDER — DIPHENHYDRAMINE-ZINC ACETATE 2-0.1 % EX CREA
TOPICAL_CREAM | Freq: Every day | CUTANEOUS | Status: DC | PRN
  Filled 2023-09-22: qty 28

## 2023-09-22 MED ORDER — LEVALBUTEROL TARTRATE 45 MCG/ACT IN AERO
1.0000 | INHALATION_SPRAY | Freq: Four times a day (QID) | RESPIRATORY_TRACT | Status: DC | PRN
  Administered 2023-09-22 (×2): 2 via RESPIRATORY_TRACT
  Filled 2023-09-22: qty 15

## 2023-09-22 NOTE — Progress Notes (Signed)
 Inpatient Rehab Admissions Coordinator:    CIR following, appeal remains pending. Discussed potentially going home with Pt. He remains anxious to go home without anyone to assist and states that he wants to see what his insurance says regarding his appeal. I am hopeful for a response on his appeal today, but the final deadline for response is tomorrow afternoon.   Leita Kleine, MS, CCC-SLP Rehab Admissions Coordinator  [phone] (celll) [phone] (office)

## 2023-09-22 NOTE — Progress Notes (Addendum)
 [address] [employer] [address]             [id number]      10 Days Post-Op Procedure(s) (LRB): OFF PUMP CORONARY ARTERY BYPASS GRAFTING X 1, USING LEFT INTERNAL MAMMARY ARTERY (N/A) Subjective: Patient reports itching and burning along his sternal incision, otherwise no new complaints.   Objective: Vital signs in last 24 hours: Temp:  [97.7 F (36.5 C)-98.2 F (36.8 C)] 98.1 F (36.7 C) (07/24 0301) Pulse Rate:  [94-103] 103 (07/24 0301) Cardiac Rhythm: Normal sinus rhythm (07/23 1900) Resp:  [19-20] 20 (07/24 0301) BP: (90-120)/(66-88) 116/67 (07/24 0301) SpO2:  [97 %-100 %] 100 % (07/24 0301) Weight:  [60.6 kg] 60.6 kg (07/24 0301)  Hemodynamic parameters for last 24 hours: CVP:  [3 mmHg] 3 mmHg  Intake/Output from previous day: 07/23 0701 - 07/24 0700 In: -  Out: 2300 [Urine:2300] Intake/Output this shift: No intake/output data recorded.  General appearance: alert, cooperative, and no distress Neurologic: intact Heart: regular rate and rhythm-sinus tachycardia, no murmur, pericardial rub Lungs: slight wheezing throughout, slightly diminished bibasilar breath sounds Abdomen: soft, non-tender; bowel sounds normal; no masses,  no organomegaly Extremities: extremities normal, atraumatic, no cyanosis or edema and SCDs in place, right arm with ecchymosis Wound: Clean and dry, slight erythema along incision and tender to palpation but no rash or purulent discharge  Lab Results: Recent Labs    09/21/23 0503 09/22/23 0315  WBC 10.8* 9.8  HGB 16.5 16.4  HCT 47.5 47.6  PLT 235 255   BMET:  Recent Labs    09/21/23 0503 09/22/23 0315  NA 132* 132*  K 3.6 4.3  CL 97* 97*  CO2 25 24  GLUCOSE 143* 192*  BUN 32* 26*  CREATININE 1.18 1.23  CALCIUM 8.3* 8.9    PT/INR: No results for input(s): LABPROT, INR in the last 72 hours. ABG    Component Value Date/Time   PHART 7.524 (H) 09/16/2023 1231   HCO3 30.6 (H) 09/16/2023 1231    TCO2 32 09/16/2023 1231   ACIDBASEDEF 6.0 (H) 09/12/2023 1606   O2SAT 83.9 09/21/2023 0503   CBG (last 3)  Recent Labs    09/21/23 1645 09/21/23 2342 09/22/23 0609  GLUCAP 141* 171* 150*    Assessment/Plan: S/P Procedure(s) (LRB): OFF PUMP CORONARY ARTERY BYPASS GRAFTING X 1, USING LEFT INTERNAL MAMMARY ARTERY (N/A)  CV: Suspected missed LAD STEMI. No plavix, on Eliquis  for LV thrombus. No BB due to low output per AHF team. Acute on chronic CHF, AHF team following and titrating GDMT. On Cozaar  25mg  daily, spironolactone  25mg  daily, and Digoxin  0.125mg . SBP 116. NSR, HR 90-low 100s. Coox 83 yesterday, Co-ox have been discontinued. Echo 07/21 LVEF 30-35%. Pericardial rub on exam, EKG stable, on colchicine  0.6mg  daily.    Pulm: Saturating well on RA. CXR 07/22 with small left pleural effusion, trace right pleural effusion and bibasilar atelectasis with streaky left lower lobe atelectasis. Slight wheezing this AM, will start Xoponex prn. Will repeat CXR in the AM. Encourage IS and ambulation.   GI: +BM, tolerating a diet. No further loose stools. Continue bowel regimen   Endo: T2DM, preop A1C 7.2. CBGs 141/171/150 well controlled on SSI, Semglee  8U BID and Novolog  2U with meals. On Metformin at home. Will restart closer to discharge.   Renal: AKI resolved Cr 1.23, stable. UO 2300cc/24hrs. Under preop weight. Continue to hold Lasix . K 4.3 at goal. Continue Flomax .  ID: Leukocytosis resolved. Afebrile. No UTI on UA. No sign of pneumonia on CXR. Wounds are clean without sign of infection. Was started on colchicine  for possible pericarditis. PICC still in place.   DVT Prophylaxis: On Eliquis  5mg  BID and ASA 81mg  daily for LV thrombus   Deconditioning: Potential CIR admit, CIR is following. Insurance authorization pending. Continue PT/OT.   R arm ecchymosis: Improved today, continue to leave blood pressure cuff off unless actively taking blood pressure  Sternal incision discomfort:  Burning and itching around sternal incision, no sign of infection or allergic reaction. Will closely monitor. May be due to mammary artery harvest.    Dispo: Hopefully can discharge to CIR later this week if insurance approves    LOS: 16 days    Con GORMAN [place], PA-C 09/22/2023   Chart reviewed, patient examined, agree with above.  Insurance has refused CIR. He does not want to go to SNF so I think the only option is to return home. He is walking well with a walker.

## 2023-09-22 NOTE — Progress Notes (Addendum)
 Contacted via office answering service that staff from 4E had a question on patient's medications.    Calling in regards to Joel Pitts incisional tingling as patient describing as  bee stings.   The nurse was requesting Benadryl . This has been occurring since rounds this morning.  This was discussed with Dr. Lucas and is not an uncommon occurrence post sternotomy.  This is usually a result of an inflammatory nerve injury from IMA harvest.  Topical Benadryl  was ordered prn this morning with application instructions by Joel Bend PA-C.  Unfortunately this can take some time to improve/resolve.   No new interventions indicated at this time  Rocky Shad, PA-C 8:48 PM 09/22/23

## 2023-09-22 NOTE — Progress Notes (Signed)
 Advanced Heart Failure Rounding Note  Cardiologist: None  AHF MD: Dr. Rolan Chief Complaint: acute systolic heart failure, post cardiotomy shock  Patient Profile   73 y/o male admitted w/ chest pain and new systolic heart failure and LV thrombus. EF 20%, RV mildly reduced. Cath w/ 99% stenosis of LAD, now s/p CABG x 1. Post op course c/b post cardiotomy shock and ATN.    Subjective:     7/14: S/p off pump CABG x 1 LIMA-LAD. Extubated same day  7/15: worsening dyspnea w/ anuric AKI, required escalation of inotrope's. POCUS ECHO with EF 25% RV markedly dilated an severe HK. Septum flattened. Started on iNO, lasix  gtt, and dual inotropes 7/18: Off iNO + DBA. PAC pulled.  7/20: Off milrinone  7/21: Echo with EF 30-35% and mod reduced RV  POD#11  No co-ox his morning.   WBC 10.6>14>10.8>WNL. Afebrile. sCr 1.3>1.1>1.4>1.18>1.23  CVP unhooked.   Feels fine this morning. Ready to get up and move around. Denies CP/SOB. Plan to put ointment on his incision site today.   Objective:    Weight Range: 60.6 kg Body mass index is 21.56 kg/m.   Vital Signs:   Temp:  [97.7 F (36.5 C)-98.2 F (36.8 C)] 98.1 F (36.7 C) (07/24 0301) Pulse Rate:  [94-103] 103 (07/24 0301) Resp:  [19-20] 20 (07/24 0301) BP: (90-120)/(66-88) 116/67 (07/24 0301) SpO2:  [97 %-100 %] 100 % (07/24 0301) Weight:  [60.6 kg] 60.6 kg (07/24 0301) Last BM Date : 09/21/23  Weight change: Filed Weights   09/20/23 2004 09/21/23 0416 09/22/23 0301  Weight: 61.2 kg 61 kg 60.6 kg   Intake/Output:  Intake/Output Summary (Last 24 hours) at 09/22/2023 0803 Last data filed at 09/22/2023 9392 Gross per 24 hour  Intake --  Output 2300 ml  Net -2300 ml    Physical Exam  General:  elderly appearing.  No respiratory difficulty Neck: JVD flat.  Cor: Regular rate & rhythm. No murmurs. Lungs: clear Extremities: no cyanosis, clubbing, rash, edema  Neuro: alert & oriented x 3. Affect pleasant.   Telemetry    SR-ST 90s-low 100s (personally reviewed)  Labs    CBC Recent Labs    09/21/23 0503 09/22/23 0315  WBC 10.8* 9.8  HGB 16.5 16.4  HCT 47.5 47.6  MCV 92.8 92.8  PLT 235 255   Basic Metabolic Panel Recent Labs    92/76/74 0503 09/22/23 0315  NA 132* 132*  K 3.6 4.3  CL 97* 97*  CO2 25 24  GLUCOSE 143* 192*  BUN 32* 26*  CREATININE 1.18 1.23  CALCIUM 8.3* 8.9   Medications:    Scheduled Medications:  apixaban   5 mg Oral BID   aspirin  EC  81 mg Oral Daily   bisacodyl   10 mg Oral Daily   Or   bisacodyl   10 mg Rectal Daily   Chlorhexidine  Gluconate Cloth  6 each Topical Daily   colchicine   0.6 mg Oral Daily   digoxin   0.125 mg Oral Daily   feeding supplement  237 mL Oral BID BM   insulin  aspart  0-15 Units Subcutaneous TID WC   insulin  aspart  2 Units Subcutaneous TID WC   insulin  glargine-yfgn  8 Units Subcutaneous BID   losartan   25 mg Oral Daily   pantoprazole   40 mg Oral Daily   spironolactone   25 mg Oral Daily   tamsulosin   0.4 mg Oral PC supper   Infusions:  PRN Medications: dextrose , levalbuterol , ondansetron  (ZOFRAN ) IV, mouth rinse,  mouth rinse, oxyCODONE , polyethylene glycol, sodium chloride , sodium chloride  flush, traMADol   Assessment/Plan  Acute systolic CHF w/ Post Cardiotomy Shock:  - Suspect late presentation of anterior STEMI, CMR indicated some viability - Echo 09/07/23 with EF <20%, G2DD, and mildly reduced RV - CMR 09/09/23 with LVEF 21%, RVEF 25%, ischemic LGE pattern, layered mural thrombus - [facility] 09/09/23 with nomal filling pressues, mild PH, low CO 2.04, and ost LAD with severely calcified disease. No good landing zones for PCI to LAD. - s/p CABG x 1, LIMA-LAD c/b post-cardiotomy shock and ATN   - Post-op echo with EF 30-35% and mod reduced RV function - Required dual inotropes and high dose diuretics but now significantly improved. Off iNO and DBA 7/18. Off milrinone  7/20.  - No co-ox this morning. Ok to stop checking.  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 S/p off pump CABG 7/14. EKG with large anterior q waves. - continue ASA + statin - Continue eliquis  5 mg bid for LV thrombus - no ? blocker w/ low output  - No chest pain on exam  AKI : Suspect post operative ATN, improved with high dose diuretics. Resolved  Apical thrombus: Noted on contrast images and somewhat equivocal. Confirmed by cMRI, layered LV apical mural thrombus  - continue eliquis  5 mg bid  Acute Hypoxic Respiratory Failure - resolved  Constipation - KUB 7/21 with large stool burden, no ileus  - significant BM 7/22 after SMOG edema - continue scheduled bowel reg  Leukocytosis - 10.6>14>WNL; afebrile - CXR 7/22 showed small b/l pleural effusions; no evidence of PNA - UA (-) - now resolved  Continue to ambulate. Plan for CIR at discharge.   Length of Stay: [address]  Beckey LITTIE Coe, NP 09/22/23, 09/22/23  Advanced Heart Failure Team Pager [phone] (M-F; 7a - 5p)  Please contact [facility] for night-coverage after hours (4p -7a ) and weekends on [email or web address]

## 2023-09-22 NOTE — Progress Notes (Signed)
 Occupational Therapy Treatment Patient Details Name: Joel Pitts MRN: [id number] DOB: 1951/02/26 Today's Date: 09/22/2023   History of present illness 73 yo male admitted 09/06/23 w/ chest pain, new systolic heart failure and LV thrombus. 7/14 s/p CABG x1. Post op course complicated by post cardiotomy shock and ATN. PMHx: T2DM, BPH, HTN.   OT comments  Pt progressing well towards goals. Improved mobility but pt with poor implementation of sternal precautions functionally, requiring frequent cueing during ADLs. Pt would benefit from continued practice with functional implementation during ADLs to promote safety upon d/c. Updated d/c recs to HHOT. Will continue to follow acutely.      If plan is discharge home, recommend the following:  A little help with walking and/or transfers;A little help with bathing/dressing/bathroom   Equipment Recommendations  BSC/3in1;Other (comment) (RW)    Recommendations for Other Services      Precautions / Restrictions Precautions Precautions: Fall;Sternal Precaution Booklet Issued: Yes (comment) Recall of Precautions/Restrictions: Impaired Precaution/Restrictions Comments: educated for precautions but needs frequent reminders Restrictions Weight Bearing Restrictions Per Provider Order: No Other Position/Activity Restrictions: Sternal Precautions       Mobility Bed Mobility Overal bed mobility: Needs Assistance   Rolling: Modified independent (Device/Increase time)     Sit to supine: Supervision, HOB elevated   General bed mobility comments: Cues to maintain precautions, pt often attempting to use UE support    Transfers Overall transfer level: Needs assistance Equipment used: Rolling walker (2 wheels) Transfers: Sit to/from Stand, Bed to chair/wheelchair/BSC Sit to Stand: Supervision     Step pivot transfers: Supervision     General transfer comment: S for safety, use of pillow but pt still reaching with UE     Balance Overall  balance assessment: Mild deficits observed, not formally tested       ADL either performed or assessed with clinical judgement   ADL Overall ADL's : Needs assistance/impaired     Grooming: Wash/dry hands;Supervision/safety;Standing           Upper Body Dressing : Supervision/safety;Sitting;Cueing for UE precautions;Adhering to UE precautions   Lower Body Dressing: Supervision/safety;Cueing for safety;Cueing for compensatory techniques;Sit to/from stand   Toilet Transfer: Supervision/safety;Rolling walker (2 wheels)   Toileting- Clothing Manipulation and Hygiene: Supervision/safety;Sitting/lateral lean       Functional mobility during ADLs: Supervision/safety;Rolling walker (2 wheels) General ADL Comments: pt with poor ability to maintain sternal precautions, requiring frequent cues    Extremity/Trunk Assessment Upper Extremity Assessment Upper Extremity Assessment: Right hand dominant;Generalized weakness   Lower Extremity Assessment Lower Extremity Assessment: Defer to PT evaluation        Vision   Vision Assessment?: No apparent visual deficits         Communication Communication Communication: No apparent difficulties   Cognition Arousal: Alert Behavior During Therapy: WFL for tasks assessed/performed Cognition: Cognition impaired     Awareness: Intellectual awareness intact, Online awareness impaired       OT - Cognition Comments: Poor insight into precautions, pt verbalizing he is maintaining precautions, but frequently using single UE support during mobility     Following commands: Intact        Cueing   Cueing Techniques: Verbal cues, Gestural cues        General Comments VSS on RA    Pertinent Vitals/ Pain       Pain Assessment Pain Assessment: No/denies pain Pain Intervention(s): Monitored during session   Frequency  Min 2X/week        Progress Toward Goals  OT  Goals(current goals can now be found in the care plan section)   Progress towards OT goals: Progressing toward goals  Acute Rehab OT Goals Patient Stated Goal: To go home OT Goal Formulation: With patient Time For Goal Achievement: 10/03/23 Potential to Achieve Goals: Good ADL Goals Pt Will Perform Lower Body Dressing: with modified independence;with adaptive equipment;sit to/from stand Pt Will Transfer to Toilet: with modified independence;ambulating;regular height toilet Pt Will Perform Toileting - Clothing Manipulation and hygiene: with modified independence;sit to/from stand;with adaptive equipment Additional ADL Goal #1: pt will complete bed mobility MOD I as precursor to adls.  Plan         AM-PAC OT 6 Clicks Daily Activity     Outcome Measure   Help from another person eating meals?: None Help from another person taking care of personal grooming?: None Help from another person toileting, which includes using toliet, bedpan, or urinal?: A Little Help from another person bathing (including washing, rinsing, drying)?: A Little Help from another person to put on and taking off regular upper body clothing?: A Little Help from another person to put on and taking off regular lower body clothing?: A Little 6 Click Score: 20    End of Session Equipment Utilized During Treatment: Rolling walker (2 wheels)  OT Visit Diagnosis: Unsteadiness on feet (R26.81);Muscle weakness (generalized) (M62.81)   Activity Tolerance Patient tolerated treatment well   Patient Left in bed;with call bell/phone within reach;with SCD's reapplied   Nurse Communication Mobility status;Precautions        Time: 1500-1520 OT Time Calculation (min): 20 min  Charges: OT General Charges $OT Visit: 1 Visit OT Treatments $Self Care/Home Management : 8-22 mins  Joel Pitts, OT  Acute Rehabilitation Services Office [phone] Secure chat preferred   Joel Pitts 09/22/2023, 3:51 PM

## 2023-09-22 NOTE — Progress Notes (Signed)
 Inpatient Rehab Admissions Coordinator:   Pt.'s case for CIR was not overturned on appeal. Pt. Remains denied by insurance. CIR will sign off.  Pt. And TOC notified.   Leita Kleine, MS, CCC-SLP Rehab Admissions Coordinator  [phone] (celll) [phone] (office)

## 2023-09-22 NOTE — Progress Notes (Signed)
 Mobility Specialist Progress Note:    09/22/23 0947  Mobility  Activity Ambulated with assistance in room;Ambulated with assistance in hallway  Level of Assistance Standby assist, set-up cues, supervision of patient - no hands on  Assistive Device Front wheel walker  Distance Ambulated (ft) 470 ft  RUE Weight Bearing Per Provider Order NWB  LUE Weight Bearing Per Provider Order NWB  Activity Response Tolerated well  Mobility Referral Yes  Mobility visit 1 Mobility  Mobility Specialist Start Time (ACUTE ONLY) 0947  Mobility Specialist Stop Time (ACUTE ONLY) 1005  Mobility Specialist Time Calculation (min) (ACUTE ONLY) 18 min   Pt received in bed, agreeable to mobility session. Ambulated in hallway with RW and SV. Tolerated well, asx throughout. Max HR 102 bpm during mobility. Returned pt to room, lying in bed with all needs met.    Joel Pitts Mobility Specialist Please contact via Special educational needs teacher or  Rehab office at [phone]

## 2023-09-22 NOTE — Plan of Care (Signed)
  Problem: Education: Goal: Knowledge of General Education information will improve Description: Including pain rating scale, medication(s)/side effects and non-pharmacologic comfort measures Outcome: Progressing   Problem: Health Behavior/Discharge Planning: Goal: Ability to manage health-related needs will improve Outcome: Progressing   Problem: Clinical Measurements: Goal: Cardiovascular complication will be avoided Outcome: Progressing   Problem: Activity: Goal: Risk for activity intolerance will decrease Outcome: Progressing   Problem: Coping: Goal: Level of anxiety will decrease Outcome: Progressing   

## 2023-09-22 NOTE — TOC Transition Note (Signed)
 Transition of Care [facility]) - Discharge Note   Patient Details  Name: Joel Pitts MRN: [id number] Date of Birth: 02/24/1951  Transition of Care [facility]) CM/SW Contact:  Justina Delcia Czar, RN Phone Number: [phone] 09/22/2023, 3:15 PM   Clinical Narrative:     Spoke to pt and agreeable to go home with [facility]. Medicare.gov list with ratings placed on chart. Contacted [facility] rep, Zebedee that pt will dc home. IP rehab was denied by insurance.   Pt has RW at home. Declined 3n1 for home. Sister will provide transportation home.  Will schedule PCP hospital follow up appt on day of dc. Hospital follow up scheduled for 09/29/2023 at 1130 am.   Final next level of care: Home w Home Health Services Barriers to Discharge: No Barriers Identified   Patient Goals and CMS Choice Patient states their goals for this hospitalization and ongoing recovery are:: wants to go home CMS Medicare.gov Compare Post Acute Care list provided to:: Patient Choice offered to / list presented to : Patient      Discharge Placement                       Discharge Plan and Services Additional resources added to the After Visit Summary for   In-house Referral: NA Discharge Planning Services: CM Consult Post Acute Care Choice: Home Health            DME Agency: NA       HH Arranged: RN, PT [facility] Agency: [facility] Date HH Agency Contacted: 09/22/23 Time HH Agency Contacted: 1514 Representative spoke with at [facility] Agency: Zebedee Braver  Social Drivers of Health (SDOH) Interventions SDOH Screenings   Food Insecurity: No Food Insecurity (09/07/2023)  Housing: Low Risk  (09/07/2023)  Transportation Needs: No Transportation Needs (09/07/2023)  Utilities: Not At Risk (09/07/2023)  Social Connections: Unknown (09/07/2023)  Tobacco Use: Low Risk  (09/12/2023)     Readmission Risk Interventions     No data to display

## 2023-09-23 ENCOUNTER — Other Ambulatory Visit (HOSPITAL_COMMUNITY): Payer: Self-pay

## 2023-09-23 ENCOUNTER — Inpatient Hospital Stay (HOSPITAL_COMMUNITY)

## 2023-09-23 LAB — BASIC METABOLIC PANEL WITH GFR
Anion gap: 7 (ref 5–15)
BUN: 17 mg/dL (ref 8–23)
CO2: 20 mmol/L — ABNORMAL LOW (ref 22–32)
Calcium: 6.7 mg/dL — ABNORMAL LOW (ref 8.9–10.3)
Chloride: 110 mmol/L (ref 98–111)
Creatinine, Ser: 0.79 mg/dL (ref 0.61–1.24)
GFR, Estimated: >60 mL/min (ref >60)
Glucose, Bld: 120 mg/dL — ABNORMAL HIGH (ref 70–99)
Potassium: 3.2 mmol/L — ABNORMAL LOW (ref 3.5–5.1)
Sodium: 137 mmol/L (ref 135–145)

## 2023-09-23 LAB — GLUCOSE, CAPILLARY: Glucose-Capillary: 159 mg/dL — ABNORMAL HIGH (ref 70–99)

## 2023-09-23 MED ORDER — DIPHENHYDRAMINE-ZINC ACETATE 2-0.1 % EX CREA: TOPICAL_CREAM | Freq: Three times a day (TID) | CUTANEOUS | Status: DC | PRN

## 2023-09-23 MED ORDER — APIXABAN 5 MG PO TABS
5.0000 mg | ORAL_TABLET | Freq: Two times a day (BID) | ORAL | 1 refills | Status: DC
  Filled 2023-09-23 – 2023-10-21 (×3): qty 60, 30d supply, fill #0

## 2023-09-23 MED ORDER — TRAMADOL HCL 50 MG PO TABS
50.0000 mg | ORAL_TABLET | Freq: Four times a day (QID) | ORAL | 0 refills | Status: DC | PRN
  Filled 2023-09-23: qty 28, 7d supply, fill #0

## 2023-09-23 MED ORDER — POTASSIUM CHLORIDE CRYS ER 20 MEQ PO TBCR
40.0000 meq | EXTENDED_RELEASE_TABLET | Freq: Once | ORAL | Status: AC
  Administered 2023-09-23: 40 meq via ORAL
  Filled 2023-09-23: qty 2

## 2023-09-23 MED ORDER — COLCHICINE 0.6 MG PO TABS
0.6000 mg | ORAL_TABLET | Freq: Every day | ORAL | 1 refills | Status: DC
  Filled 2023-09-23 – 2023-10-21 (×2): qty 30, 30d supply, fill #0

## 2023-09-23 MED ORDER — SPIRONOLACTONE 25 MG PO TABS
25.0000 mg | ORAL_TABLET | Freq: Every day | ORAL | 1 refills | Status: DC
  Filled 2023-09-23 – 2023-10-21 (×2): qty 30, 30d supply, fill #0

## 2023-09-23 MED ORDER — LOSARTAN POTASSIUM 25 MG PO TABS
25.0000 mg | ORAL_TABLET | Freq: Every day | ORAL | 1 refills | Status: DC
  Filled 2023-09-23 – 2023-10-21 (×2): qty 30, 30d supply, fill #0

## 2023-09-23 MED ORDER — DIGOXIN 125 MCG PO TABS
0.1250 mg | ORAL_TABLET | Freq: Every day | ORAL | 1 refills | Status: DC
  Filled 2023-09-23 – 2023-10-21 (×2): qty 30, 30d supply, fill #0

## 2023-09-23 NOTE — Progress Notes (Signed)
PICC line was removed per order with no complications.  Pt supine and suspended inspiration during removal with instructions to remain supine for 30 minutes after removal.  Pressure held to achieve hemostasis.  Vaseline/gauze/tegaderm applied.  Patient education provided regarding lifting restrictions, site care, and signs of infection.  Pt verbalized understanding and had no questions.

## 2023-09-23 NOTE — Progress Notes (Addendum)
 [address] [place] [employer] [address]             [phone]      11 Days Post-Op Procedure(s) (LRB): OFF PUMP CORONARY ARTERY BYPASS GRAFTING X 1, USING LEFT INTERNAL MAMMARY ARTERY (N/A) Subjective: Patient continues to complain of burning along the incision when it is touched  Objective: Vital signs in last 24 hours: Temp:  [97.7 F (36.5 C)-98 F (36.7 C)] 97.7 F (36.5 C) (07/25 0435) Pulse Rate:  [88-100] 88 (07/25 0435) Cardiac Rhythm: Normal sinus rhythm (07/24 1950) Resp:  [13-23] 13 (07/24 1714) BP: (108-125)/(69-74) 125/72 (07/25 0435) SpO2:  [94 %-99 %] 99 % (07/25 0435)  Hemodynamic parameters for last 24 hours:    Intake/Output from previous day: 07/24 0701 - 07/25 0700 In: -  Out: 1750 [Urine:1750] Intake/Output this shift: No intake/output data recorded.  General appearance: alert, cooperative, and no distress Neurologic: intact Heart: regular rate and rhythm, no murmur, pericardial friction rub Lungs: slightly diminished bibasilar breath sounds Abdomen: soft, non-tender; bowel sounds normal; no masses,  no organomegaly Extremities: extremities normal, atraumatic, no cyanosis or edema Wound: Clean and dry without sign of infection, erythema along the incision but no purulent discharge, no rash or sign of allergic reaction, tender along incision  Lab Results: Recent Labs    09/21/23 0503 09/22/23 0315  WBC 10.8* 9.8  HGB 16.5 16.4  HCT 47.5 47.6  PLT 235 255   BMET:  Recent Labs    09/22/23 0315 09/23/23 0431  NA 132* 137  K 4.3 3.2*  CL 97* 110  CO2 24 20*  GLUCOSE 192* 120*  BUN 26* 17  CREATININE 1.23 0.79  CALCIUM 8.9 6.7*    PT/INR: No results for input(s): LABPROT, INR in the last 72 hours. ABG    Component Value Date/Time   PHART 7.524 (H) 09/16/2023 1231   HCO3 30.6 (H) 09/16/2023 1231   TCO2 32 09/16/2023 1231   ACIDBASEDEF 6.0 (H) 09/12/2023 1606   O2SAT 83.9 09/21/2023 0503   CBG (last  3)  Recent Labs    09/22/23 1713 09/22/23 2130 09/23/23 0607  GLUCAP 239* 146* 159*    Assessment/Plan: S/P Procedure(s) (LRB): OFF PUMP CORONARY ARTERY BYPASS GRAFTING X 1, USING LEFT INTERNAL MAMMARY ARTERY (N/A)  CV: Suspected missed LAD STEMI. No plavix, on Eliquis  for LV thrombus. No BB due to low output per AHF team. Acute on chronic CHF, AHF  titrated GDMT, has now signed off. On Cozaar  25mg  daily, spironolactone  25mg  daily, and Digoxin  0.125mg . SBP 120s this AM. NSR, HR 90-low 100s. Echo 07/21 LVEF 30-35%. Pericardial rub on exam, EKG stable, on colchicine  0.6mg  daily for possible pericarditis.   Pulm: Saturating well on RA. CXR with small left pleural effusion, bibasilar atelectasis and streaky left lower lobe atelectasis. On Xoponex prn for wheezing no longer heard this AM. Encourage IS and ambulation.   GI: +BM, tolerating a diet. No further loose stools. Continue bowel regimen   Endo: T2DM, preop A1C 7.2. CBGs 239/146/159, some hyperglycemia on SSI, Semglee  8U BID and Novolog  2U with meals. On Metformin at home. Will restart today.    Renal: AKI resolved Cr 0.79, stable. UO 1750cc/24hrs. Under preop weight. No Lasix . K 3.2 down from 4.3 yesterday, patient not on Lasix  and on Spironolactone  so unsure if accurate. Will supplement with Kdur 40meq. Continue Flomax .    ID: Leukocytosis resolved. Afebrile. No UTI on UA. No sign of  pneumonia on CXR. Wounds are clean without sign of infection. Was started on colchicine  for possible pericarditis. PICC still in place, will d/c today.    DVT Prophylaxis: On Eliquis  5mg  BID and ASA 81mg  daily for LV thrombus   Deconditioning: CIR denied, patient refuses SNF, would like to go home with home health. Continue PT/OT.    R arm ecchymosis: Improved today, continue to leave blood pressure cuff off unless actively taking blood pressure   Sternal incision discomfort: Burning and itching around sternal incision, no sign of infection or allergic  reaction other than erythema along the incision which is normal postoperatively. Will closely monitor. May be inflammatory nerve injury due to mammary artery harvest. Not uncommon after surgery. Patient does not want to try gabapentin at this time. Also possible allergic reaction to sutures/betadine, reports benadryl  cream around the incision helped a little. Continue at discharge. Discussed with Dr. Lucas and he also feels this is normal postoperatively and likely due to nerve injury from mammary artery harvest, should resolve with time.    Dispo: Plan to d/c home with [facility] PT/OT/RN, patient lives alone but has friends to help out   LOS: 17 days    Con GORMAN Bend, [place] 09/23/2023

## 2023-09-23 NOTE — Progress Notes (Signed)
 Arrived to room to remove PICC line.  Per primary RN, I will plan to wait until closer to time of discharge to remove the line in case further labs are needed.  She will send a secure chat when discharge orders are in place.

## 2023-09-23 NOTE — Progress Notes (Signed)
 Discharge instructions (including medications) discussed with and copy provided to patient/caregiver

## 2023-09-29 ENCOUNTER — Ambulatory Visit
Attending: Thoracic Surgery (Cardiothoracic Vascular Surgery) | Admitting: Thoracic Surgery (Cardiothoracic Vascular Surgery)

## 2023-09-29 DIAGNOSIS — Z951 Presence of aortocoronary bypass graft: Secondary | ICD-10-CM

## 2023-09-29 NOTE — Progress Notes (Signed)
     [address]       [name] [address]             [phone]       Patient: Home Provider: Office Consent for Telemedicine visit obtained.  Today's visit was completed via a real-time telehealth (see specific modality noted below). The patient/authorized person provided oral consent at the time of the visit to engage in a telemedicine encounter with the present provider at [facility]. The patient/authorized person was informed of the potential benefits, limitations, and risks of telemedicine. The patient/authorized person expressed understanding that the laws that protect confidentiality also apply to telemedicine. The patient/authorized person acknowledged understanding that telemedicine does not provide emergency services and that he or she would need to call 911 or proceed to the nearest hospital for help if such a need arose.   Total time spent in the clinical discussion 10 minutes.  Telehealth Modality: Phone visit (audio only)  I had a telephone visit with  Joel Pitts who is s/p CABG.  Overall doing well.  Pain is minimal.  Ambulating well. Vitals have been stable.  Joel Pitts will see us  back in 1 month with a chest x-ray for cardiac rehab clearance.  Joel Pitts

## 2023-09-30 ENCOUNTER — Telehealth: Admitting: Thoracic Surgery (Cardiothoracic Vascular Surgery)

## 2023-10-06 ENCOUNTER — Ambulatory Visit (HOSPITAL_COMMUNITY)
Admission: RE | Admit: 2023-10-06 | Discharge: 2023-10-06 | Disposition: A | Discharge status: Home / Self Care | Source: Ambulatory Visit | Attending: Cardiology | Admitting: Cardiology

## 2023-10-06 ENCOUNTER — Encounter (HOSPITAL_COMMUNITY): Payer: Self-pay | Admitting: Cardiology

## 2023-10-06 ENCOUNTER — Encounter (HOSPITAL_COMMUNITY)

## 2023-10-06 VITALS — BP 110/70 | HR 100 | Wt 130.8 lb

## 2023-10-06 DIAGNOSIS — I2510 Atherosclerotic heart disease of native coronary artery without angina pectoris: Secondary | ICD-10-CM | POA: Diagnosis not present

## 2023-10-06 DIAGNOSIS — Z951 Presence of aortocoronary bypass graft: Secondary | ICD-10-CM | POA: Diagnosis not present

## 2023-10-06 DIAGNOSIS — I5022 Chronic systolic (congestive) heart failure: Secondary | ICD-10-CM | POA: Diagnosis present

## 2023-10-06 DIAGNOSIS — Z7901 Long term (current) use of anticoagulants: Secondary | ICD-10-CM | POA: Diagnosis not present

## 2023-10-06 DIAGNOSIS — Z79899 Other long term (current) drug therapy: Secondary | ICD-10-CM | POA: Diagnosis not present

## 2023-10-06 DIAGNOSIS — E119 Type 2 diabetes mellitus without complications: Secondary | ICD-10-CM | POA: Diagnosis not present

## 2023-10-06 NOTE — Patient Instructions (Signed)
  Follow-Up in: 6 weeks as scheduled   At the Advanced [facility] Failure Clinic, you and your health needs are our priority. We have a designated team specialized in the treatment of Heart Failure. This Care Team includes your primary Heart Failure Specialized Cardiologist (physician), Advanced Practice Providers (APPs- Physician Assistants and Nurse Practitioners), and Pharmacist who all work together to provide you with the care you need, when you need it.   You may see any of the following providers on your designated Care Team at your next follow up:  Dr. Toribio Fuel Dr. Ezra Shuck Dr. Ria Commander Dr. Odis Brownie Greig Mosses, NP Caffie Shed, [place] [facility], [place] Beckey Coe, NP [place] Lee, NP Tinnie Redman, PharmD   Please be sure to bring in all your medications bottles to every appointment.   Need to Contact Us :  If you have any questions or concerns before your next appointment please send us  a message through [place] or call our office at [phone].    TO LEAVE A MESSAGE FOR THE NURSE SELECT OPTION 2, PLEASE LEAVE A MESSAGE INCLUDING: YOUR NAME DATE OF BIRTH CALL BACK NUMBER REASON FOR CALL**this is important as we prioritize the call backs  YOU WILL RECEIVE A CALL BACK THE SAME DAY AS LONG AS YOU CALL BEFORE 4:00 PM

## 2023-10-06 NOTE — Progress Notes (Signed)
   ADVANCED HEART FAILURE FOLLOW UP CLINIC NOTE  Referring Physician: Nanci Senior, MD  Primary Care: Nanci Senior, MD Primary Cardiologist:  HPI: Joel Pitts is a 73 y.o. male who presents for follow up of chronic systolic heart failure.      Patient with a past medical history of diabetes. He presented to the hospital after being seen by his PCP with symptoms of SOB, BLE edema, and orthopnea. Workup showed severely reduced LVEF, layered LV thrombus, and coronary angiography showing multivessel CAD.  Underwent single vessel CABG off pump with Dr. Shyrl. Significant post operative shock requiring dual inotropes     SUBJECTIVE:  Patient overall doing better, he has been ambulatory at home, walking quite a bit with PT over the past few days. He notes some occasoinal sharp chest discomfort, almost a twinge, and not associated with any exertion. He denies any lower extremity swelling or discomfort, no orthopnea. His heart rate has remained high, about 20bpm faster than his previous prior to surgery.  PMH, current medications, allergies, social history, and family history reviewed in epic.  PHYSICAL EXAM: Vitals:   10/06/23 1030  BP: 110/70  Pulse: 100  SpO2: 97%   GENERAL: Well nourished and in no apparent distress at rest.  PULM:  Normal work of breathing, clear to auscultation bilaterally. Respirations are unlabored.  CARDIAC:  JVP: flat         Normal rate with regular rhythm. No murmurs, rubs or gallops.  No edema. Warm and well perfused extremities. Well healing sternal scar ABDOMEN: Soft, non-tender, non-distended. NEUROLOGIC: Patient is oriented x3 with no focal or lateralizing neurologic deficits.    DATA REVIEW  ECG: 10/06/2023: NSR, anteroseptal infarct    ECHO: 09/07/2023: layered apical thrombus, LVEF <20%, RV mildly reduced, moderately dilated atrium   CATH: 09/09/2023: Moderate RCA disease, 99% proximal LAD lesion, 60% OM3 disease, RA 3, PA 39/15  (mean 25), PCWP 17, Fick CO/CI 3.42/2.04   Heart failure review: - Classification: Heart failure with reduced EF - Etiology: Ischemic - NYHA Class: III - Volume status: Euvolemic - ACEi/ARB/ARNI: Currently up-titrating - Aldosterone antagonist: Maximally tolerated dose - Beta-blocker: Plan to start at a subsequent visit - Digoxin : Maximally tolerated dose - Hydralazine /Nitrates: Not indicated - SGLT2i: Plan to start at a subsequent visit - GLP-1: Not needed - Advanced therapies: Not needed at this time - ICD: Currently uptitrating GDMT  ASSESSMENT & PLAN:  Chronic systolic heart failure: Ischemic, severe LAD disease s/p off pump LIMA-LAD. Also with significant RV failure following the procedure, suspect that is driving his persistent sinus tachycardia. - Continue follow up with CT surgery, will benefit from cardiac rehab once cleared - Continue losartan  25mg  daliy, digoxin  0.125mg  daily - Continue spironolactone  25mg  daily - Repeat echo after titration of GDMT - Euvolemic, stable NYHA class III symptoms  CAD: S/p LIMA-LAD, no further anginal symptoms. 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 - On apixaban  - Repeat echo in the future  Follow up in 2 months  Morene Brownie, MD Advanced Heart Failure Mechanical Circulatory Support 10/06/23

## 2023-10-21 ENCOUNTER — Other Ambulatory Visit (HOSPITAL_COMMUNITY): Payer: Self-pay

## 2023-10-21 ENCOUNTER — Other Ambulatory Visit: Payer: Self-pay

## 2023-10-26 ENCOUNTER — Other Ambulatory Visit: Payer: Self-pay | Admitting: Thoracic Surgery (Cardiothoracic Vascular Surgery)

## 2023-10-26 DIAGNOSIS — Z951 Presence of aortocoronary bypass graft: Secondary | ICD-10-CM

## 2023-10-28 ENCOUNTER — Ambulatory Visit
Admission: RE | Admit: 2023-10-28 | Discharge: 2023-10-28 | Disposition: A | Discharge status: Home / Self Care | Source: Ambulatory Visit | Attending: Cardiology | Admitting: Cardiology

## 2023-10-28 ENCOUNTER — Ambulatory Visit (INDEPENDENT_AMBULATORY_CARE_PROVIDER_SITE_OTHER)

## 2023-10-28 VITALS — BP 137/81 | HR 92 | Resp 20 | Wt 130.8 lb

## 2023-10-28 DIAGNOSIS — Z951 Presence of aortocoronary bypass graft: Secondary | ICD-10-CM | POA: Insufficient documentation

## 2023-10-28 NOTE — Patient Instructions (Signed)
 You may return to driving an automobile as long as you are no longer requiring oral narcotic pain relievers during the daytime.  It would be wise to start driving only short distances during the daylight and gradually increase from there as you feel comfortable.   You may continue to gradually increase your physical activity as tolerated.  Refrain from any heavy lifting or strenuous use of your arms and shoulders until at least 6 weeks from the time of your surgery, and avoid activities that cause increased pain in your chest on the side of your surgical incision.  Otherwise you may continue to increase activities without any particular limitations.  Increase the intensity and duration of physical activity gradually.   Continue follow-up with cardiology, advanced heart failure clinic and primary care provider as scheduled

## 2023-10-28 NOTE — Progress Notes (Addendum)
 [address] [place] [address]             [phone]       HPI:  Mr. Joel Pitts is a 73 year old male with medical history of CHF, hypertension, CAD, type 2 diabetes, and BPH who returns for routine postoperative follow-up having undergone Off pump CABG X 1, LIMA to LAD on 09/12/2023 with Dr. Shyrl.  The patient's early postoperative recovery while in the hospital was notable for having an LV apical thrombus and was started on heparin .  This was transition to eliquis .  He was started on colchicine  for pericardial rub. He was followed by the advanced heart failure team and GDMT medications were titrated per their recommendations.  He did have itching along his sternal incision which was treated with benadryl  cream. He was stable for discharge on 09/23/2023 with home health.  Since hospital discharge the patient reports that he has been doing really well.  He does find that on the left side of his incision there is some numbness but he has not needed to take anything for pain.  The top portion of his incision did have a scab that fell off and he has now has a  small open area.  He has been keeping it clean and dry with a Band-Aid over top of it.  No erythema or drainage.  He has been walking for exercise and has been attempting to walk half a mile every day.  He denies shortness of breath and lower leg swelling.   Allergies as of 10/28/2023       Reactions   Statins Diarrhea        Medication List        Accurate as of October 28, 2023 11:49 AM. If you have any questions, ask your nurse or doctor.          aspirin  81 MG tablet Take 81 mg by mouth daily.   colchicine  0.6 MG tablet Take 1 tablet (0.6 mg total) by mouth daily.   digoxin  0.125 MG tablet Commonly known as: LANOXIN  Take 1 tablet (0.125 mg total) by mouth daily.   Eliquis  5 MG Tabs tablet Generic drug: apixaban  Take 1 tablet (5 mg total) by mouth 2 (two) times daily.    losartan  25 MG tablet Commonly known as: COZAAR  Take 1 tablet (25 mg total) by mouth daily.   metFORMIN 500 MG tablet Commonly known as: GLUCOPHAGE Take 500 mg by mouth 2 (two) times daily with a meal.   silodosin 8 MG Caps capsule Commonly known as: RAPAFLO Take 8 mg by mouth daily.   spironolactone  25 MG tablet Commonly known as: ALDACTONE  Take 1 tablet (25 mg total) by mouth daily.   zolpidem 10 MG tablet Commonly known as: AMBIEN Take 10 mg by mouth at bedtime.         Review of Systems  Constitutional: Negative.  Negative for fever and malaise/fatigue.  Respiratory: Negative.  Negative for cough and shortness of breath.   Cardiovascular: Negative.  Negative for chest pain, palpitations and leg swelling.       BP 137/81 (BP Location: Right Arm, Patient Position: Sitting, Cuff Size: Normal)   Pulse 92   Resp 20   Wt 130 lb 12.8 oz (59.3 kg)   SpO2 97% Comment: RA  BMI 21.11 kg/m   Physical Exam Constitutional:      Appearance: Normal appearance.  HENT:  Head: Normocephalic and atraumatic.  Cardiovascular:     Rate and Rhythm: Normal rate and regular rhythm.     Heart sounds: Normal heart sounds, S1 normal and S2 normal.  Pulmonary:     Effort: Pulmonary effort is normal.     Breath sounds: Normal breath sounds.  Skin:    General: Skin is warm and dry.         Comments: Incision healed besides 0.5cm area along top border that is  swallow opening that is 0.5 cm deep.  Area is without erythema, drainage, tenderness or warmth   Neurological:     General: No focal deficit present.     Mental Status: He is alert and oriented to person, place, and time.       Imaging: CLINICAL DATA:  Status post coronary artery bypass graft.   EXAM: CHEST - 2 VIEW   COMPARISON:  September 23, 2023.   FINDINGS: The heart size and mediastinal contours are within normal limits. Sternotomy wires are noted. Both lungs are clear. Old midthoracic compression fracture  is noted.   IMPRESSION: No active cardiopulmonary disease.     Electronically Signed   By: Lynwood Landy Raddle M.D.   On: 10/28/2023 10:46   Assessment/Plan: We reviewed today's chest x ray. We discussed driving and he is able to do so at this time.  First time driving should be a short distance in the daytime and he can slowly increase every after that.  He would like to participate in cardiac rehab and is cleared to do so from a surgical standpoint.  Discussed the continuance of sternal precautions until a full 6 weeks from surgery.  Area of the incision that is open should be kept clean and dry with soap and water.  He can continue to put a Band-Aid over top of it.  He should not use Neosporin or Vaseline.  He has been seen by advanced heart failure clinic and he is to continue losartan , digoxin , and spironolactone .   He is to continue aspirin  and eliquis .  He is unable to tolerate statins and was referred to lipid clinic.   Follow up with [facility] as needed  Apical Thrombus -Continue follow up with cardiology.   -He is to continue eliquis .   -Repeat echocardiogram in the future    Manuelita CHRISTELLA Rough, PA-C 11:49 AM 10/28/23

## 2023-11-03 ENCOUNTER — Telehealth (HOSPITAL_COMMUNITY): Payer: Self-pay

## 2023-11-03 NOTE — Telephone Encounter (Signed)
 Attempted to call patient to schedule cardiac rehab- no answer, left message. Sent MyChart message.

## 2023-11-17 ENCOUNTER — Telehealth (HOSPITAL_COMMUNITY): Payer: Self-pay

## 2023-11-17 NOTE — Telephone Encounter (Signed)
 Attempted f/u call regarding cardiac rehab- no answer, left message. Sent MyChart message.  Closing referral.

## 2023-11-23 ENCOUNTER — Ambulatory Visit (HOSPITAL_COMMUNITY)
Admission: RE | Admit: 2023-11-23 | Discharge: 2023-11-23 | Disposition: A | Discharge status: Home / Self Care | Source: Ambulatory Visit | Attending: Cardiology | Admitting: Cardiology

## 2023-11-23 ENCOUNTER — Encounter (HOSPITAL_COMMUNITY): Payer: Self-pay | Admitting: Cardiology

## 2023-11-23 VITALS — BP 128/70 | HR 89 | Wt 136.2 lb

## 2023-11-23 DIAGNOSIS — E119 Type 2 diabetes mellitus without complications: Secondary | ICD-10-CM | POA: Insufficient documentation

## 2023-11-23 DIAGNOSIS — Z79899 Other long term (current) drug therapy: Secondary | ICD-10-CM | POA: Diagnosis not present

## 2023-11-23 DIAGNOSIS — I513 Intracardiac thrombosis, not elsewhere classified: Secondary | ICD-10-CM | POA: Diagnosis not present

## 2023-11-23 DIAGNOSIS — I2510 Atherosclerotic heart disease of native coronary artery without angina pectoris: Secondary | ICD-10-CM | POA: Insufficient documentation

## 2023-11-23 DIAGNOSIS — I5022 Chronic systolic (congestive) heart failure: Secondary | ICD-10-CM | POA: Diagnosis present

## 2023-11-23 DIAGNOSIS — Z7982 Long term (current) use of aspirin: Secondary | ICD-10-CM | POA: Diagnosis not present

## 2023-11-23 DIAGNOSIS — Z7901 Long term (current) use of anticoagulants: Secondary | ICD-10-CM | POA: Insufficient documentation

## 2023-11-23 DIAGNOSIS — Z951 Presence of aortocoronary bypass graft: Secondary | ICD-10-CM | POA: Insufficient documentation

## 2023-11-23 MED ORDER — LOSARTAN POTASSIUM 25 MG PO TABS: 25.0000 mg | ORAL_TABLET | Freq: Every day | ORAL | 3 refills | Status: AC

## 2023-11-23 MED ORDER — SPIRONOLACTONE 25 MG PO TABS: 25.0000 mg | ORAL_TABLET | Freq: Every day | ORAL | 3 refills | Status: AC

## 2023-11-23 MED ORDER — DIGOXIN 125 MCG PO TABS: 0.1250 mg | ORAL_TABLET | Freq: Every day | ORAL | 3 refills | Status: DC

## 2023-11-23 MED ORDER — APIXABAN 5 MG PO TABS: 5.0000 mg | ORAL_TABLET | Freq: Two times a day (BID) | ORAL | 11 refills | Status: DC

## 2023-11-23 MED ORDER — METOPROLOL SUCCINATE ER 25 MG PO TB24: 25.0000 mg | ORAL_TABLET | Freq: Every day | ORAL | 3 refills | Status: AC

## 2023-11-23 NOTE — Patient Instructions (Addendum)
 START Toprol  Xl 25 mg daily.  STOP COLCHICINE  ON 12/13/23.  Your physician has requested that you have an echocardiogram. Echocardiography is a painless test that uses sound waves to create images of your heart. It provides your doctor with information about the size and shape of your heart and how well your heart's chambers and valves are working. This procedure takes approximately one hour. There are no restrictions for this procedure. Please do NOT wear cologne, perfume, aftershave, or lotions (deodorant is allowed). Please arrive 15 minutes prior to your appointment time.  Please note: We ask at that you not bring children with you during ultrasound (echo/ vascular) testing. Due to room size and safety concerns, children are not allowed in the ultrasound rooms during exams. Our front office staff cannot provide observation of children in our lobby area while testing is being conducted. An adult accompanying a patient to their appointment will only be allowed in the ultrasound room at the discretion of the ultrasound technician under special circumstances. We apologize for any inconvenience.  Your physician recommends that you schedule a follow-up appointment in: 2 months.  If you have any questions or concerns before your next appointment please send us  a message through [place] or call our office at [phone].    TO LEAVE A MESSAGE FOR THE NURSE SELECT OPTION 2, PLEASE LEAVE A MESSAGE INCLUDING: YOUR NAME DATE OF BIRTH CALL BACK NUMBER REASON FOR CALL**this is important as we prioritize the call backs  YOU WILL RECEIVE A CALL BACK THE SAME DAY AS LONG AS YOU CALL BEFORE 4:00 PM  At the [facility] Failure Clinic, you and your health needs are our priority. As part of our continuing mission to provide you with exceptional heart care, we have created designated Provider Care Teams. These Care Teams include your primary Cardiologist (physician) and Advanced Practice Providers (APPs-  Physician Assistants and Nurse Practitioners) who all work together to provide you with the care you need, when you need it.   You may see any of the following providers on your designated Care Team at your next follow up: Dr Toribio Fuel Dr Ezra Shuck Dr. Ria Commander Dr. Morene Brownie Amy Lenetta, NP Caffie Shed, [place] [facility] [place], [place] Beckey Coe, NP [place] Lee, NP Ellouise Class, NP Tinnie Redman, PharmD Jaun Bash, PharmD   Please be sure to bring in all your medications bottles to every appointment.    Thank you for choosing [facility]

## 2023-11-23 NOTE — Progress Notes (Signed)
   ADVANCED HEART FAILURE FOLLOW UP CLINIC NOTE  Referring Physician: Nanci Senior, MD  Primary Care: Nanci Senior, MD Primary Cardiologist:  HPI: Joel Pitts is a 73 y.o. male who presents for follow up of chronic systolic heart failure.      Patient with a past medical history of diabetes. He presented to the hospital after being seen by his PCP with symptoms of SOB, BLE edema, and orthopnea. Workup showed severely reduced LVEF, layered LV thrombus, and coronary angiography showing multivessel CAD.  Underwent single vessel CABG off pump with Dr. Shyrl. Significant post operative shock requiring dual inotropes     SUBJECTIVE:  Continues to improve symptomatically, has not yet started cardiac rehab but was cleared by surgery.  Was not sure that he could start from a clearance standpoint.  No issues with his medications, has overall been feeling well.  No fluid buildup, orthopnea, PND.  PMH, current medications, allergies, social history, and family history reviewed in epic.  PHYSICAL EXAM: Vitals:   11/23/23 1041  BP: 128/70  Pulse: 89  SpO2: 100%    GENERAL: NAD, well appearing PULM:  Normal work of breathing, CTAB CARDIAC:  JVP: flat         Normal rate with regular rhythm. No murmurs, rubs or gallops.  No edema. Warm and well perfused extremities. ABDOMEN: Soft, non-tender, non-distended. NEUROLOGIC: Patient is oriented x3 with no focal or lateralizing neurologic deficits.     DATA REVIEW  ECG: 10/06/2023: NSR, anteroseptal infarct    ECHO: 09/07/2023: layered apical thrombus, LVEF <20%, RV mildly reduced, moderately dilated atrium   CATH: 09/09/2023: Moderate RCA disease, 99% proximal LAD lesion, 60% OM3 disease, RA 3, PA 39/15 (mean 25), PCWP 17, Fick CO/CI 3.42/2.04   ASSESSMENT & PLAN:  Chronic systolic heart failure: Ischemic, severe LAD disease s/p off pump LIMA-LAD. Also with significant RV failure following the procedure, tachcyardia post  procedure improving.  - Start metoprolol  25mg  daily, appears well compensated - Encouraged cardiac rehab - Continue losartan  25 mg daily -Continue digoxin  0.125 mg daily, if stable can discontinue at next visit -Continue spironolactone  25 mg daily - SGLT2 held initially given hypovolemic. Will await repeat echo before restarting - Creatinine now back to normal - Repeat echo ordered - Euvolemic, stable NYHA class II symptoms  CAD: S/p LIMA-LAD, no further anginal symptoms. 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 - On apixaban  - Repeat echo in the future  Follow up in 2 months  Morene Brownie, MD Advanced Heart Failure Mechanical Circulatory Support 12/02/23

## 2024-01-14 NOTE — Progress Notes (Signed)
   ADVANCED HEART FAILURE FOLLOW UP CLINIC NOTE  Referring Physician: Nanci Senior, MD  Primary Care: Nanci Senior, MD Primary Cardiologist:  HPI: Joel Pitts is a 73 y.o. male who presents for follow up of chronic systolic heart failure.      Patient with a past medical history of diabetes. He presented to the hospital after being seen by his PCP with symptoms of SOB, BLE edema, and orthopnea. Workup showed severely reduced LVEF, layered LV thrombus, and coronary angiography showing multivessel CAD.  Underwent single vessel CABG off pump with Dr. Shyrl. Significant post operative shock requiring dual inotropes     SUBJECTIVE:  Doing well, wanted to hold off on cardiac rehab until after the new year. Still occasionally fatigued, but much better than prior. Discussed PCSK9 for prevention at length, would like to hold off on referral at this time.   PMH, current medications, allergies, social history, and family history reviewed in epic.  PHYSICAL EXAM: Vitals:   01/16/24 1154  BP: 130/70  Pulse: (!) 55  SpO2: 97%     GENERAL: NAD, well appearing PULM:  Normal work of breathing, CTAB CARDIAC:  JVP: flat         Normal rate with regular rhythm. No murmurs, rubs or gallops.  No edema. Warm and well perfused extremities. ABDOMEN: Soft, non-tender, non-distended. NEUROLOGIC: Patient is oriented x3 with no focal or lateralizing neurologic deficits.  SABRA     DATA REVIEW  ECG: 10/06/2023: NSR, anteroseptal infarct    ECHO: 09/07/2023: layered apical thrombus, LVEF <20%, RV mildly reduced, moderately dilated atrium  01/16/2024: LVEF 40-45%, LAD infarct, RV normal  CATH: 09/09/2023: Moderate RCA disease, 99% proximal LAD lesion, 60% OM3 disease, RA 3, PA 39/15 (mean 25), PCWP 17, Fick CO/CI 3.42/2.04   ASSESSMENT & PLAN:  Chronic systolic heart failure: Ischemic, severe LAD disease s/p off pump LIMA-LAD. Echo now shows significant improvement in LVEF. NYHA class  II symptoms, euvolemic - Continue metoprolol  succinate 25mg  daily - Continue losartan  25mg  daily, increase at next visit - Stop digoxin  today - SGLT2 deferred given euvolemic and improved EF, discussed with patient today - Continue spironolactone  25mg  daily - Cardiac rehab encouraged  CAD: S/p LIMA-LAD 09/15/2023, no further anginal symptoms. 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 With imrpoved EF and no evidence on echo today.  - Stop apixaban   Follow up in 6 months  Morene Brownie, MD Advanced Heart Failure Mechanical Circulatory Support 01/18/24

## 2024-01-16 ENCOUNTER — Ambulatory Visit (HOSPITAL_BASED_OUTPATIENT_CLINIC_OR_DEPARTMENT_OTHER)
Admission: RE | Admit: 2024-01-16 | Discharge: 2024-01-16 | Disposition: A | Discharge status: Home / Self Care | Source: Ambulatory Visit | Attending: Cardiology | Admitting: Cardiology

## 2024-01-16 ENCOUNTER — Ambulatory Visit (HOSPITAL_COMMUNITY)
Admission: RE | Admit: 2024-01-16 | Discharge: 2024-01-16 | Disposition: A | Source: Ambulatory Visit | Attending: Cardiology

## 2024-01-16 ENCOUNTER — Encounter (HOSPITAL_COMMUNITY): Payer: Self-pay | Admitting: Cardiology

## 2024-01-16 VITALS — BP 130/70 | HR 55 | Wt 142.0 lb

## 2024-01-16 DIAGNOSIS — I5022 Chronic systolic (congestive) heart failure: Secondary | ICD-10-CM | POA: Insufficient documentation

## 2024-01-16 DIAGNOSIS — I2510 Atherosclerotic heart disease of native coronary artery without angina pectoris: Secondary | ICD-10-CM | POA: Diagnosis not present

## 2024-01-16 DIAGNOSIS — I428 Other cardiomyopathies: Secondary | ICD-10-CM | POA: Insufficient documentation

## 2024-01-16 DIAGNOSIS — I513 Intracardiac thrombosis, not elsewhere classified: Secondary | ICD-10-CM | POA: Insufficient documentation

## 2024-01-16 DIAGNOSIS — Z79899 Other long term (current) drug therapy: Secondary | ICD-10-CM | POA: Diagnosis not present

## 2024-01-16 DIAGNOSIS — E119 Type 2 diabetes mellitus without complications: Secondary | ICD-10-CM | POA: Insufficient documentation

## 2024-01-16 DIAGNOSIS — Z951 Presence of aortocoronary bypass graft: Secondary | ICD-10-CM | POA: Insufficient documentation

## 2024-01-16 DIAGNOSIS — I110 Hypertensive heart disease with heart failure: Secondary | ICD-10-CM | POA: Insufficient documentation

## 2024-01-16 DIAGNOSIS — I371 Nonrheumatic pulmonary valve insufficiency: Secondary | ICD-10-CM | POA: Diagnosis not present

## 2024-01-16 LAB — ECHOCARDIOGRAM COMPLETE
AR max vel: 2.34 cm2
AV Area VTI: 2.14 cm2
AV Area mean vel: 2.1 cm2
AV Mean grad: 3 mmHg
AV Peak grad: 4.6 mmHg
Ao pk vel: 1.07 m/s
Area-P 1/2: 3.56 cm2
S' Lateral: 3.8 cm

## 2024-01-16 MED ORDER — CLOPIDOGREL BISULFATE 75 MG PO TABS: 75.0000 mg | ORAL_TABLET | Freq: Every day | ORAL | 3 refills | Status: AC

## 2024-01-16 NOTE — Patient Instructions (Signed)
 STOP Digoxin   STOP Eliquis   START Plavix 75 mg daily.  Your physician recommends that you schedule a follow-up appointment in: 6 months ( May 2026) ** PLEASE CALL THE OFFICE IN MARCH 2026 TO ARRANGE YOUR FOLLOW UP APPOINTMENT**  If you have any questions or concerns before your next appointment please send us  a message through mychart or call our office at [phone].    TO LEAVE A MESSAGE FOR THE NURSE SELECT OPTION 2, PLEASE LEAVE A MESSAGE INCLUDING: YOUR NAME DATE OF BIRTH CALL BACK NUMBER REASON FOR CALL**this is important as we prioritize the call backs  YOU WILL RECEIVE A CALL BACK THE SAME DAY AS LONG AS YOU CALL BEFORE 4:00 PM  At the [facility], you and your health needs are our priority. As part of our continuing mission to provide you with exceptional heart care, we have created designated Provider Care Teams. These Care Teams include your primary Cardiologist (physician) and Advanced Practice Providers (APPs- Physician Assistants and Nurse Practitioners) who all work together to provide you with the care you need, when you need it.   You may see any of the following providers on your designated Care Team at your next follow up: Dr Toribio Fuel Dr Ezra Shuck Dr. Morene Brownie Greig Mosses, NP Caffie Shed, [place] [facility] [place], [place] Beckey Coe, NP Jordan Lee, NP Ellouise Class, NP Tinnie Redman, PharmD Jaun Bash, PharmD   Please be sure to bring in all your medications bottles to every appointment.    Thank you for choosing [facility]

## 2024-01-24 ENCOUNTER — Encounter (HOSPITAL_COMMUNITY): Payer: Self-pay

## 2024-01-24 ENCOUNTER — Telehealth (HOSPITAL_COMMUNITY): Payer: Self-pay

## 2024-01-24 NOTE — Telephone Encounter (Signed)
Attempted to call patient in regards to Cardiac Rehab - LM on VM   Sent letter 

## 2024-03-08 ENCOUNTER — Telehealth (HOSPITAL_COMMUNITY): Payer: Self-pay

## 2024-03-08 NOTE — Telephone Encounter (Signed)
 Pt insurance is active and benefits verified through [facility] Medicare. Co-pay $0, DED $250/$0 met, out of pocket $2,250/$0 met, co-insurance 20%. No pre-authorization required. Passport, 03/08/2024 @ 10:29am, REF# [id number].  TCR/ICR? ICR Visit(date of service)limitation? No Can multiple codes be used on the same date of service/visit?(IF ITS A LIMIT) N/A  Is this a lifetime maximum or an annual maximum? Annual Has the member used any of these services to date? No Is there a time limit (weeks/months) on start of program and/or program completion? No

## 2024-03-08 NOTE — Telephone Encounter (Signed)
 Attempted to schedule cardiac rehab- no answer, left message. Sent MyChart message.

## 2024-03-09 ENCOUNTER — Telehealth (HOSPITAL_COMMUNITY): Payer: Self-pay

## 2024-03-09 NOTE — Telephone Encounter (Signed)
 Patient called back to get scheduled in the Cardiac Rehab Program. Patient will come in for orientation on 1/27 and will attend the 11:45 exercise class.  Sent MyChart message.

## 2024-03-27 ENCOUNTER — Encounter (HOSPITAL_COMMUNITY): Payer: Self-pay

## 2024-03-27 ENCOUNTER — Encounter (HOSPITAL_COMMUNITY)
Admission: RE | Admit: 2024-03-27 | Discharge: 2024-03-27 | Disposition: A | Discharge status: Home / Self Care | Source: Ambulatory Visit | Attending: Cardiology | Admitting: Cardiology

## 2024-03-27 VITALS — BP 142/68 | HR 64 | Ht 64.0 in | Wt 152.1 lb

## 2024-03-27 DIAGNOSIS — Z48812 Encounter for surgical aftercare following surgery on the circulatory system: Secondary | ICD-10-CM | POA: Diagnosis present

## 2024-03-27 DIAGNOSIS — Z951 Presence of aortocoronary bypass graft: Secondary | ICD-10-CM | POA: Diagnosis not present

## 2024-03-27 LAB — GLUCOSE, CAPILLARY: Glucose-Capillary: 183 mg/dL — ABNORMAL HIGH (ref 70–99)

## 2024-03-27 NOTE — Progress Notes (Signed)
 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  Patient's Home Medications on Admission: Current Medications[1]  Past Medical History: Past Medical History:  Diagnosis Date   Diabetes mellitus without complication (HCC)    History of kidney stones     Tobacco Use: Tobacco Use History[2]  Labs: Review Flowsheet  More data exists      Latest Ref Rng & Units 09/17/2023 09/18/2023 09/19/2023 09/20/2023 09/21/2023  Labs for ITP Cardiac and Pulmonary Rehab  O2 Saturation % 62.4  73.4  63.8  69.2  75.6  73.5  55.3  83.9     Details       Multiple values from one day are sorted in reverse-chronological order         Capillary Blood Glucose: Lab Results  Component Value Date   GLUCAP 183 (H) 03/27/2024   GLUCAP 159 (H) 09/23/2023   GLUCAP 146 (H) 09/22/2023   GLUCAP 239 (H) 09/22/2023   GLUCAP 131 (H) 09/22/2023     Exercise Target Goals: Exercise Program Goal: Individual exercise prescription set using results from initial 6 min walk test and THRR while considering  patients activity barriers and safety.   Exercise Prescription Goal: Initial exercise prescription builds to 30-45 minutes a day of aerobic activity, 2-3 days per week.  Home exercise guidelines will be given to patient during program as part of exercise prescription that the participant will acknowledge.  Activity Barriers & Risk Stratification:  Activity Barriers & Cardiac Risk Stratification - 03/27/24 1415       Activity  Barriers & Cardiac Risk Stratification   Activity Barriers Balance Concerns;Deconditioning;Muscular Weakness;Decreased Ventricular Function    Cardiac Risk Stratification High          6 Minute Walk:  6 Minute Walk     Row Name 03/27/24 1304         6 Minute Walk   Phase Initial     Distance 1195 feet     Walk Time 6 minutes     # of Rest Breaks 0     MPH 2.3     METS 2.5     RPE 9     Perceived Dyspnea  0     VO2 Peak 8.7     Symptoms No     Resting HR 64 bpm     Resting BP 142/68     Resting Oxygen Saturation  98 %     Exercise Oxygen Saturation  during 6 min walk 97 %     Max Ex. HR 86 bpm     Max Ex. BP 154/80     2 Minute Post BP 140/80        Oxygen Initial Assessment:   Oxygen Re-Evaluation:   Oxygen Discharge (Final Oxygen Re-Evaluation):   Initial Exercise Prescription:  Initial Exercise Prescription - 03/27/24 1400       Date of Initial Exercise RX and Referring Provider   Date 03/27/24    Referring Provider Morene Brownie, MD    Expected Discharge Date 06/20/24  Bike   Level 1    Watts 20    Minutes 15    METs 2.5      Arm Ergometer   Level 1    Watts 29    RPM 60    Minutes 15    METs 2.5      Prescription Details   Frequency (times per week) 3    Duration Progress to 30 minutes of continuous aerobic without signs/symptoms of physical distress      Intensity   THRR 40-80% of Max Heartrate 58-117    Ratings of Perceived Exertion 11-13    Perceived Dyspnea 0-4      Progression   Progression Continue progressive overload as per policy without signs/symptoms or physical distress.      Resistance Training   Training Prescription Yes    Weight 3 lbs    Reps 10-15          Perform Capillary Blood Glucose checks as needed.  Exercise Prescription Changes:   Exercise Comments:   Exercise Goals and Review:   Exercise Goals     Row Name 03/27/24 1417             Exercise Goals   Increase Physical Activity  Yes       Intervention Provide advice, education, support and counseling about physical activity/exercise needs.;Develop an individualized exercise prescription for aerobic and resistive training based on initial evaluation findings, risk stratification, comorbidities and participant's personal goals.       Expected Outcomes Short Term: Attend rehab on a regular basis to increase amount of physical activity.;Long Term: Add in home exercise to make exercise part of routine and to increase amount of physical activity.;Long Term: Exercising regularly at least 3-5 days a week.       Increase Strength and Stamina Yes       Intervention Provide advice, education, support and counseling about physical activity/exercise needs.;Develop an individualized exercise prescription for aerobic and resistive training based on initial evaluation findings, risk stratification, comorbidities and participant's personal goals.       Expected Outcomes Short Term: Increase workloads from initial exercise prescription for resistance, speed, and METs.;Short Term: Perform resistance training exercises routinely during rehab and add in resistance training at home;Long Term: Improve cardiorespiratory fitness, muscular endurance and strength as measured by increased METs and functional capacity ( )       Able to understand and use rate of perceived exertion (RPE) scale Yes       Intervention Provide education and explanation on how to use RPE scale       Expected Outcomes Short Term: Able to use RPE daily in rehab to express subjective intensity level;Long Term:  Able to use RPE to guide intensity level when exercising independently       Knowledge and understanding of Target Heart Rate Range (THRR) Yes       Intervention Provide education and explanation of THRR including how the numbers were predicted and where they are located for reference       Expected Outcomes Short Term: Able to state/look up THRR;Short Term: Able to use  daily as guideline for intensity in rehab;Long Term: Able to use THRR to govern intensity when exercising independently       Understanding of Exercise Prescription Yes       Intervention Provide education, explanation, and written materials on patient's individual exercise prescription       Expected Outcomes Short Term: Able to explain program exercise prescription;Long Term:  Able to explain home exercise prescription to exercise independently          Exercise Goals Re-Evaluation :   Discharge Exercise Prescription (Final Exercise Prescription Changes):   Nutrition:  Target Goals: Understanding of nutrition guidelines, daily intake of sodium 1500mg , cholesterol 200mg , calories 30% from fat and 7% or less from saturated fats, daily to have 5 or more servings of fruits and vegetables.  Biometrics:  Pre Biometrics - 03/27/24 1156       Pre Biometrics   Waist Circumference 39.5 inches    Hip Circumference 38 inches    Waist to Hip Ratio 1.04 %    Triceps Skinfold 11 mm    % Body Fat 26.2 %    Grip Strength 24 kg    Flexibility 0 in   Pt could not reach the box   Single Leg Stand 6.68 seconds           Nutrition Therapy Plan and Nutrition Goals:   Nutrition Assessments:  MEDIFICTS Score Key: >=70 Need to make dietary changes  40-70 Heart Healthy Diet <= 40 Therapeutic Level Cholesterol Diet    Picture Your Plate Scores: <59 Unhealthy dietary pattern with much room for improvement. 41-50 Dietary pattern unlikely to meet recommendations for good health and room for improvement. 51-60 More healthful dietary pattern, with some room for improvement.  >60 Healthy dietary pattern, although there may be some specific behaviors that could be improved.    Nutrition Goals Re-Evaluation:   Nutrition Goals Re-Evaluation:   Nutrition Goals Discharge (Final Nutrition Goals Re-Evaluation):   Psychosocial: Target Goals: Acknowledge presence or absence of significant  depression and/or stress, maximize coping skills, provide positive support system. Participant is able to verbalize types and ability to use techniques and skills needed for reducing stress and depression.  Initial Review & Psychosocial Screening:  Initial Psych Review & Screening - 03/27/24 1054       Family Dynamics   Good Support System? Yes   Pt has his sister and a friend as a part of his support system   Concerns Recent loss of significant other   Pt also lost his son to suicide 10 years ago   Comments Wife passed away two years ago      Screening Interventions   Interventions Encouraged to exercise;To provide support and resources with identified psychosocial needs;Provide feedback about the scores to participant    Expected Outcomes Long Term Goal: Stressors or current issues are controlled or eliminated.;Short Term goal: Identification and review with participant of any Quality of Life or Depression concerns found by scoring the questionnaire.;Long Term goal: The participant improves quality of Life and PHQ9 Scores as seen by post scores and/or verbalization of changes          Quality of Life Scores:  Quality of Life - 03/27/24 1146       Quality of Life   Select Quality of Life      Quality of Life Scores   Health/Function Pre 27.8 %    Socioeconomic Pre 30 %    Psych/Spiritual Pre 30 %    Family Pre 30 %    GLOBAL Pre 28.94 %         Scores of 19 and below usually indicate a poorer quality of life in these areas.  A difference of  2-3 points is a clinically meaningful difference.  A difference of 2-3 points in the total score of the Quality of Life Index has been associated with significant  improvement in overall quality of life, self-image, physical symptoms, and general health in studies assessing change in quality of life.  PHQ-9: Review Flowsheet       03/27/2024  Depression screen PHQ 2/9  Decreased Interest 0  Down, Depressed, Hopeless 0  PHQ - 2 Score 0   Altered sleeping 1  Tired, decreased energy 1  Change in appetite 0  Feeling bad or failure about yourself  0  Trouble concentrating 0  Moving slowly or fidgety/restless 0  Suicidal thoughts 0  PHQ-9 Score 2  Difficult doing work/chores Not difficult at all   Interpretation of Total Score  Total Score Depression Severity:  1-4 = Minimal depression, 5-9 = Mild depression, 10-14 = Moderate depression, 15-19 = Moderately severe depression, 20-27 = Severe depression   Psychosocial Evaluation and Intervention:   Psychosocial Re-Evaluation:   Psychosocial Discharge (Final Psychosocial Re-Evaluation):   Vocational Rehabilitation: Provide vocational rehab assistance to qualifying candidates.   Vocational Rehab Evaluation & Intervention:  Vocational Rehab - 03/27/24 1116       Initial Vocational Rehab Evaluation & Intervention   Assessment shows need for Vocational Rehabilitation No   Pt is retired         Education: Education Goals: Education classes will be provided on a weekly basis, covering required topics. Participant will state understanding/return demonstration of topics presented.     Core Videos: Exercise    Move It!  Clinical staff conducted group or individual video education with verbal and written material and guidebook.  Patient learns the recommended Pritikin exercise program. Exercise with the goal of living a long, healthy life. Some of the health benefits of exercise include controlled diabetes, healthier blood pressure levels, improved cholesterol levels, improved heart and lung capacity, improved sleep, and better body composition. Everyone should speak with their doctor before starting or changing an exercise routine.  Biomechanical Limitations Clinical staff conducted group or individual video education with verbal and written material and guidebook.  Patient learns how biomechanical limitations can impact exercise and how we can mitigate and possibly  overcome limitations to have an impactful and balanced exercise routine.  Body Composition Clinical staff conducted group or individual video education with verbal and written material and guidebook.  Patient learns that body composition (ratio of muscle mass to fat mass) is a key component to assessing overall fitness, rather than body weight alone. Increased fat mass, especially visceral belly fat, can put us  at increased risk for metabolic syndrome, type 2 diabetes, heart disease, and even death. It is recommended to combine diet and exercise (cardiovascular and resistance training) to improve your body composition. Seek guidance from your physician and exercise physiologist before implementing an exercise routine.  Exercise Action Plan Clinical staff conducted group or individual video education with verbal and written material and guidebook.  Patient learns the recommended strategies to achieve and enjoy long-term exercise adherence, including variety, self-motivation, self-efficacy, and positive decision making. Benefits of exercise include fitness, good health, weight management, more energy, better sleep, less stress, and overall well-being.  Medical   Heart Disease Risk Reduction Clinical staff conducted group or individual video education with verbal and written material and guidebook.  Patient learns our heart is our most vital organ as it circulates oxygen, nutrients, white blood cells, and hormones throughout the entire body, and carries waste away. Data supports a plant-based eating plan like the Pritikin Program for its effectiveness in slowing progression of and reversing heart disease. The video provides a number of recommendations to  address heart disease.   Metabolic Syndrome and Belly Fat  Clinical staff conducted group or individual video education with verbal and written material and guidebook.  Patient learns what metabolic syndrome is, how it leads to heart disease, and how  one can reverse it and keep it from coming back. You have metabolic syndrome if you have 3 of the following 5 criteria: abdominal obesity, high blood pressure, high triglycerides, low HDL cholesterol, and high blood sugar.  Hypertension and Heart Disease Clinical staff conducted group or individual video education with verbal and written material and guidebook.  Patient learns that high blood pressure, or hypertension, is very common in the [place] . Hypertension is largely due to excessive salt intake, but other important risk factors include being overweight, physical inactivity, drinking too much alcohol, smoking, and not eating enough potassium from fruits and vegetables. High blood pressure is a leading risk factor for heart attack, stroke, congestive heart failure, dementia, kidney failure, and premature death. Long-term effects of excessive salt intake include stiffening of the arteries and thickening of heart muscle and organ damage. Recommendations include ways to reduce hypertension and the risk of heart disease.  Diseases of Our Time - Focusing on Diabetes Clinical staff conducted group or individual video education with verbal and written material and guidebook.  Patient learns why the best way to stop diseases of our time is prevention, through food and other lifestyle changes. Medicine (such as prescription pills and surgeries) is often only a Band-Aid on the problem, not a long-term solution. Most common diseases of our time include obesity, type 2 diabetes, hypertension, heart disease, and cancer. The Pritikin Program is recommended and has been proven to help reduce, reverse, and/or prevent the damaging effects of metabolic syndrome.  Nutrition   Overview of the Pritikin Eating Plan  Clinical staff conducted group or individual video education with verbal and written material and guidebook.  Patient learns about the Pritikin Eating Plan for disease risk reduction. The Pritikin  Eating Plan emphasizes a wide variety of unrefined, minimally-processed carbohydrates, like fruits, vegetables, whole grains, and legumes. Go, Caution, and Stop food choices are explained. Plant-based and lean animal proteins are emphasized. Rationale provided for low sodium intake for blood pressure control, low added sugars for blood sugar stabilization, and low added fats and oils for coronary artery disease risk reduction and weight management.  Calorie Density  Clinical staff conducted group or individual video education with verbal and written material and guidebook.  Patient learns about calorie density and how it impacts the Pritikin Eating Plan. Knowing the characteristics of the food you choose will help you decide whether those foods will lead to weight gain or weight loss, and whether you want to consume more or less of them. Weight loss is usually a side effect of the Pritikin Eating Plan because of its focus on low calorie-dense foods.  Label Reading  Clinical staff conducted group or individual video education with verbal and written material and guidebook.  Patient learns about the Pritikin recommended label reading guidelines and corresponding recommendations regarding calorie density, added sugars, sodium content, and whole grains.  Dining Out - Part 1  Clinical staff conducted group or individual video education with verbal and written material and guidebook.  Patient learns that restaurant meals can be sabotaging because they can be so high in calories, fat, sodium, and/or sugar. Patient learns recommended strategies on how to positively address this and avoid unhealthy pitfalls.  Facts on Fats  Clinical staff conducted group  or individual video education with verbal and written material and guidebook.  Patient learns that lifestyle modifications can be just as effective, if not more so, as many medications for lowering your risk of heart disease. A Pritikin lifestyle can help to  reduce your risk of inflammation and atherosclerosis (cholesterol build-up, or plaque, in the artery walls). Lifestyle interventions such as dietary choices and physical activity address the cause of atherosclerosis. A review of the types of fats and their impact on blood cholesterol levels, along with dietary recommendations to reduce fat intake is also included.  Nutrition Action Plan  Clinical staff conducted group or individual video education with verbal and written material and guidebook.  Patient learns how to incorporate Pritikin recommendations into their lifestyle. Recommendations include planning and keeping personal health goals in mind as an important part of their success.  Healthy Mind-Set    Healthy Minds, Bodies, Hearts  Clinical staff conducted group or individual video education with verbal and written material and guidebook.  Patient learns how to identify when they are stressed. Video will discuss the impact of that stress, as well as the many benefits of stress management. Patient will also be introduced to stress management techniques. The way we think, act, and feel has an impact on our hearts.  How Our Thoughts Can Heal Our Hearts  Clinical staff conducted group or individual video education with verbal and written material and guidebook.  Patient learns that negative thoughts can cause depression and anxiety. This can result in negative lifestyle behavior and serious health problems. Cognitive behavioral therapy is an effective method to help control our thoughts in order to change and improve our emotional outlook.  Additional Videos:  Exercise    Improving Performance  Clinical staff conducted group or individual video education with verbal and written material and guidebook.  Patient learns to use a non-linear approach by alternating intensity levels and lengths of time spent exercising to help burn more calories and lose more body fat. Cardiovascular exercise helps  improve heart health, metabolism, hormonal balance, blood sugar control, and recovery from fatigue. Resistance training improves strength, endurance, balance, coordination, reaction time, metabolism, and muscle mass. Flexibility exercise improves circulation, posture, and balance. Seek guidance from your physician and exercise physiologist before implementing an exercise routine and learn your capabilities and proper form for all exercise.  Introduction to Yoga  Clinical staff conducted group or individual video education with verbal and written material and guidebook.  Patient learns about yoga, a discipline of the coming together of mind, breath, and body. The benefits of yoga include improved flexibility, improved range of motion, better posture and core strength, increased lung function, weight loss, and positive self-image. Yogas heart health benefits include lowered blood pressure, healthier heart rate, decreased cholesterol and triglyceride levels, improved immune function, and reduced stress. Seek guidance from your physician and exercise physiologist before implementing an exercise routine and learn your capabilities and proper form for all exercise.  Medical   Aging: Enhancing Your Quality of Life  Clinical staff conducted group or individual video education with verbal and written material and guidebook.  Patient learns key strategies and recommendations to stay in good physical health and enhance quality of life, such as prevention strategies, having an advocate, securing a Health Care Proxy and Power of Attorney, and keeping a list of medications and system for tracking them. It also discusses how to avoid risk for bone loss.  Biology of Weight Control  Clinical staff conducted group or individual video  education with verbal and written material and guidebook.  Patient learns that weight gain occurs because we consume more calories than we burn (eating more, moving less). Even if your body  weight is normal, you may have higher ratios of fat compared to muscle mass. Too much body fat puts you at increased risk for cardiovascular disease, heart attack, stroke, type 2 diabetes, and obesity-related cancers. In addition to exercise, following the Pritikin Eating Plan can help reduce your risk.  Decoding Lab Results  Clinical staff conducted group or individual video education with verbal and written material and guidebook.  Patient learns that lab test reflects one measurement whose values change over time and are influenced by many factors, including medication, stress, sleep, exercise, food, hydration, pre-existing medical conditions, and more. It is recommended to use the knowledge from this video to become more involved with your lab results and evaluate your numbers to speak with your doctor.   Diseases of Our Time - Overview  Clinical staff conducted group or individual video education with verbal and written material and guidebook.  Patient learns that according to the CDC, 50% to 70% of chronic diseases (such as obesity, type 2 diabetes, elevated lipids, hypertension, and heart disease) are avoidable through lifestyle improvements including healthier food choices, listening to satiety cues, and increased physical activity.  Sleep Disorders Clinical staff conducted group or individual video education with verbal and written material and guidebook.  Patient learns how good quality and duration of sleep are important to overall health and well-being. Patient also learns about sleep disorders and how they impact health along with recommendations to address them, including discussing with a physician.  Nutrition  Dining Out - Part 2 Clinical staff conducted group or individual video education with verbal and written material and guidebook.  Patient learns how to plan ahead and communicate in order to maximize their dining experience in a healthy and nutritious manner. Included are  recommended food choices based on the type of restaurant the patient is visiting.   Fueling a Banker conducted group or individual video education with verbal and written material and guidebook.  There is a strong connection between our food choices and our health. Diseases like obesity and type 2 diabetes are very prevalent and are in large-part due to lifestyle choices. The Pritikin Eating Plan provides plenty of food and hunger-curbing satisfaction. It is easy to follow, affordable, and helps reduce health risks.  Menu Workshop  Clinical staff conducted group or individual video education with verbal and written material and guidebook.  Patient learns that restaurant meals can sabotage health goals because they are often packed with calories, fat, sodium, and sugar. Recommendations include strategies to plan ahead and to communicate with the manager, chef, or server to help order a healthier meal.  Planning Your Eating Strategy  Clinical staff conducted group or individual video education with verbal and written material and guidebook.  Patient learns about the Pritikin Eating Plan and its benefit of reducing the risk of disease. The Pritikin Eating Plan does not focus on calories. Instead, it emphasizes high-quality, nutrient-rich foods. By knowing the characteristics of the foods, we choose, we can determine their calorie density and make informed decisions.  Targeting Your Nutrition Priorities  Clinical staff conducted group or individual video education with verbal and written material and guidebook.  Patient learns that lifestyle habits have a tremendous impact on disease risk and progression. This video provides eating and physical activity recommendations based on your  personal health goals, such as reducing LDL cholesterol, losing weight, preventing or controlling type 2 diabetes, and reducing high blood pressure.  Vitamins and Minerals  Clinical staff conducted  group or individual video education with verbal and written material and guidebook.  Patient learns different ways to obtain key vitamins and minerals, including through a recommended healthy diet. It is important to discuss all supplements you take with your doctor.   Healthy Mind-Set    Smoking Cessation  Clinical staff conducted group or individual video education with verbal and written material and guidebook.  Patient learns that cigarette smoking and tobacco addiction pose a serious health risk which affects millions of people. Stopping smoking will significantly reduce the risk of heart disease, lung disease, and many forms of cancer. Recommended strategies for quitting are covered, including working with your doctor to develop a successful plan.  Culinary   Becoming a Set Designer conducted group or individual video education with verbal and written material and guidebook.  Patient learns that cooking at home can be healthy, cost-effective, quick, and puts them in control. Keys to cooking healthy recipes will include looking at your recipe, assessing your equipment needs, planning ahead, making it simple, choosing cost-effective seasonal ingredients, and limiting the use of added fats, salts, and sugars.  Cooking - Breakfast and Snacks  Clinical staff conducted group or individual video education with verbal and written material and guidebook.  Patient learns how important breakfast is to satiety and nutrition through the entire day. Recommendations include key foods to eat during breakfast to help stabilize blood sugar levels and to prevent overeating at meals later in the day. Planning ahead is also a key component.  Cooking - Educational Psychologist conducted group or individual video education with verbal and written material and guidebook.  Patient learns eating strategies to improve overall health, including an approach to cook more at home. Recommendations  include thinking of animal protein as a side on your plate rather than center stage and focusing instead on lower calorie dense options like vegetables, fruits, whole grains, and plant-based proteins, such as beans. Making sauces in large quantities to freeze for later and leaving the skin on your vegetables are also recommended to maximize your experience.  Cooking - Healthy Salads and Dressing Clinical staff conducted group or individual video education with verbal and written material and guidebook.  Patient learns that vegetables, fruits, whole grains, and legumes are the foundations of the Pritikin Eating Plan. Recommendations include how to incorporate each of these in flavorful and healthy salads, and how to create homemade salad dressings. Proper handling of ingredients is also covered. Cooking - Soups and [employer] - Soups and Desserts Clinical staff conducted group or individual video education with verbal and written material and guidebook.  Patient learns that Pritikin soups and desserts make for easy, nutritious, and delicious snacks and meal components that are low in sodium, fat, sugar, and calorie density, while high in vitamins, minerals, and filling fiber. Recommendations include simple and healthy ideas for soups and desserts.   Overview     The Pritikin Solution Program Overview Clinical staff conducted group or individual video education with verbal and written material and guidebook.  Patient learns that the results of the Pritikin Program have been documented in more than 100 articles published in peer-reviewed journals, and the benefits include reducing risk factors for (and, in some cases, even reversing) high cholesterol, high blood pressure, type 2 diabetes, obesity,  and more! An overview of the three key pillars of the Pritikin Program will be covered: eating well, doing regular exercise, and having a healthy mind-set.  WORKSHOPS  Exercise: Exercise Basics:  Building Your Action Plan Clinical staff led group instruction and group discussion with PowerPoint presentation and patient guidebook. To enhance the learning environment the use of posters, models and videos may be added. At the conclusion of this workshop, patients will comprehend the difference between physical activity and exercise, as well as the benefits of incorporating both, into their routine. Patients will understand the FITT (Frequency, Intensity, Time, and Type) principle and how to use it to build an exercise action plan. In addition, safety concerns and other considerations for exercise and cardiac rehab will be addressed by the presenter. The purpose of this lesson is to promote a comprehensive and effective weekly exercise routine in order to improve patients overall level of fitness.   Managing Heart Disease: Your Path to a Healthier Heart Clinical staff led group instruction and group discussion with PowerPoint presentation and patient guidebook. To enhance the learning environment the use of posters, models and videos may be added.At the conclusion of this workshop, patients will understand the anatomy and physiology of the heart. Additionally, they will understand how Pritikins three pillars impact the risk factors, the progression, and the management of heart disease.  The purpose of this lesson is to provide a high-level overview of the heart, heart disease, and how the Pritikin lifestyle positively impacts risk factors.  Exercise Biomechanics Clinical staff led group instruction and group discussion with PowerPoint presentation and patient guidebook. To enhance the learning environment the use of posters, models and videos may be added. Patients will learn how the structural parts of their bodies function and how these functions impact their daily activities, movement, and exercise. Patients will learn how to promote a neutral spine, learn how to manage pain, and identify  ways to improve their physical movement in order to promote healthy living. The purpose of this lesson is to expose patients to common physical limitations that impact physical activity. Participants will learn practical ways to adapt and manage aches and pains, and to minimize their effect on regular exercise. Patients will learn how to maintain good posture while sitting, walking, and lifting.  Balance Training and Fall Prevention  Clinical staff led group instruction and group discussion with PowerPoint presentation and patient guidebook. To enhance the learning environment the use of posters, models and videos may be added. At the conclusion of this workshop, patients will understand the importance of their sensorimotor skills (vision, proprioception, and the vestibular system) in maintaining their ability to balance as they age. Patients will apply a variety of balancing exercises that are appropriate for their current level of function. Patients will understand the common causes for poor balance, possible solutions to these problems, and ways to modify their physical environment in order to minimize their fall risk. The purpose of this lesson is to teach patients about the importance of maintaining balance as they age and ways to minimize their risk of falling.  WORKSHOPS   Nutrition:  Fueling a Ship Broker led group instruction and group discussion with PowerPoint presentation and patient guidebook. To enhance the learning environment the use of posters, models and videos may be added. Patients will review the foundational principles of the Pritikin Eating Plan and understand what constitutes a serving size in each of the food groups. Patients will also learn Pritikin-friendly foods that are better  choices when away from home and review make-ahead meal and snack options. Calorie density will be reviewed and applied to three nutrition priorities: weight maintenance, weight  loss, and weight gain. The purpose of this lesson is to reinforce (in a group setting) the key concepts around what patients are recommended to eat and how to apply these guidelines when away from home by planning and selecting Pritikin-friendly options. Patients will understand how calorie density may be adjusted for different weight management goals.  Mindful Eating  Clinical staff led group instruction and group discussion with PowerPoint presentation and patient guidebook. To enhance the learning environment the use of posters, models and videos may be added. Patients will briefly review the concepts of the Pritikin Eating Plan and the importance of low-calorie dense foods. The concept of mindful eating will be introduced as well as the importance of paying attention to internal hunger signals. Triggers for non-hunger eating and techniques for dealing with triggers will be explored. The purpose of this lesson is to provide patients with the opportunity to review the basic principles of the Pritikin Eating Plan, discuss the value of eating mindfully and how to measure internal cues of hunger and fullness using the Hunger Scale. Patients will also discuss reasons for non-hunger eating and learn strategies to use for controlling emotional eating.  Targeting Your Nutrition Priorities Clinical staff led group instruction and group discussion with PowerPoint presentation and patient guidebook. To enhance the learning environment the use of posters, models and videos may be added. Patients will learn how to determine their genetic susceptibility to disease by reviewing their family history. Patients will gain insight into the importance of diet as part of an overall healthy lifestyle in mitigating the impact of genetics and other environmental insults. The purpose of this lesson is to provide patients with the opportunity to assess their personal nutrition priorities by looking at their family history, their own  health history and current risk factors. Patients will also be able to discuss ways of prioritizing and modifying the Pritikin Eating Plan for their highest risk areas  Menu  Clinical staff led group instruction and group discussion with PowerPoint presentation and patient guidebook. To enhance the learning environment the use of posters, models and videos may be added. Using menus brought in from [employer], or printed from [employer], patients will apply the Pritikin dining out guidelines that were presented in the [employer] video. Patients will also be able to practice these guidelines in a variety of provided scenarios. The purpose of this lesson is to provide patients with the opportunity to practice hands-on learning of the Pritikin Dining Out guidelines with actual menus and practice scenarios.  Label Reading Clinical staff led group instruction and group discussion with PowerPoint presentation and patient guidebook. To enhance the learning environment the use of posters, models and videos may be added. Patients will review and discuss the Pritikin label reading guidelines presented in Pritikins Label Reading Educational series video. Using fool labels brought in from local grocery stores and markets, patients will apply the label reading guidelines and determine if the packaged food meet the Pritikin guidelines. The purpose of this lesson is to provide patients with the opportunity to review, discuss, and practice hands-on learning of the Pritikin Label Reading guidelines with actual packaged food labels. Cooking School  Pritikins [employer] are designed to teach patients ways to prepare quick, simple, and affordable recipes at home. The importance of nutritions role in chronic disease risk  reduction is reflected in its emphasis in the overall Pritikin program. By learning how to prepare essential core Pritikin Eating Plan recipes, patients will  increase control over what they eat; be able to customize the flavor of foods without the use of added salt, sugar, or fat; and improve the quality of the food they consume. By learning a set of core recipes which are easily assembled, quickly prepared, and affordable, patients are more likely to prepare more healthy foods at home. These workshops focus on convenient breakfasts, simple entres, side dishes, and desserts which can be prepared with minimal effort and are consistent with nutrition recommendations for cardiovascular risk reduction. Cooking [employer] are taught by a armed forces logistics/support/administrative officer (RD) who has been trained by the [employer]. The chef or RD has a clear understanding of the importance of minimizing - if not completely eliminating - added fat, sugar, and sodium in recipes. Throughout the series of Cooking School Workshop sessions, patients will learn about healthy ingredients and efficient methods of cooking to build confidence in their capability to prepare    Cooking School weekly topics:  Adding Flavor- Sodium-Free  Fast and Healthy Breakfasts  Powerhouse Plant-Based Proteins  Satisfying Salads and Dressings  Simple Sides and Sauces  International Cuisine-Spotlight on the [employer] Zones  Delicious Desserts  Savory Soups  [employer] - Meals in a Astronomer Appetizers and Snacks  Comforting Weekend Breakfasts  One-Pot Wonders   Fast Evening Meals  Landscape Architect Your Pritikin Plate  WORKSHOPS   Healthy Mindset (Psychosocial):  Focused Goals, Sustainable Changes Clinical staff led group instruction and group discussion with PowerPoint presentation and patient guidebook. To enhance the learning environment the use of posters, models and videos may be added. Patients will be able to apply effective goal setting strategies to establish at least one personal goal, and then take consistent, meaningful action toward that goal.  They will learn to identify common barriers to achieving personal goals and develop strategies to overcome them. Patients will also gain an understanding of how our mind-set can impact our ability to achieve goals and the importance of cultivating a positive and growth-oriented mind-set. The purpose of this lesson is to provide patients with a deeper understanding of how to set and achieve personal goals, as well as the tools and strategies needed to overcome common obstacles which may arise along the way.  From Head to Heart: The Power of a Healthy Outlook  Clinical staff led group instruction and group discussion with PowerPoint presentation and patient guidebook. To enhance the learning environment the use of posters, models and videos may be added. Patients will be able to recognize and describe the impact of emotions and mood on physical health. They will discover the importance of self-care and explore self-care practices which may work for them. Patients will also learn how to utilize the 4 Cs to cultivate a healthier outlook and better manage stress and challenges. The purpose of this lesson is to demonstrate to patients how a healthy outlook is an essential part of maintaining good health, especially as they continue their cardiac rehab journey.  Healthy Sleep for a Healthy Heart Clinical staff led group instruction and group discussion with PowerPoint presentation and patient guidebook. To enhance the learning environment the use of posters, models and videos may be added. At the conclusion of this workshop, patients will be able to demonstrate knowledge of the importance of sleep to overall health, well-being, and quality of  life. They will understand the symptoms of, and treatments for, common sleep disorders. Patients will also be able to identify daytime and nighttime behaviors which impact sleep, and they will be able to apply these tools to help manage sleep-related challenges. The purpose of  this lesson is to provide patients with a general overview of sleep and outline the importance of quality sleep. Patients will learn about a few of the most common sleep disorders. Patients will also be introduced to the concept of sleep hygiene, and discover ways to self-manage certain sleeping problems through simple daily behavior changes. Finally, the workshop will motivate patients by clarifying the links between quality sleep and their goals of heart-healthy living.   Recognizing and Reducing Stress Clinical staff led group instruction and group discussion with PowerPoint presentation and patient guidebook. To enhance the learning environment the use of posters, models and videos may be added. At the conclusion of this workshop, patients will be able to understand the types of stress reactions, differentiate between acute and chronic stress, and recognize the impact that chronic stress has on their health. They will also be able to apply different coping mechanisms, such as reframing negative self-talk. Patients will have the opportunity to practice a variety of stress management techniques, such as deep abdominal breathing, progressive muscle relaxation, and/or guided imagery.  The purpose of this lesson is to educate patients on the role of stress in their lives and to provide healthy techniques for coping with it.  Learning Barriers/Preferences:  Learning Barriers/Preferences - 03/27/24 1434       Learning Barriers/Preferences   Learning Barriers Sight    Learning Preferences Individual Instruction;Group Instruction;Skilled Demonstration          Education Topics:  Knowledge Questionnaire Score:  Knowledge Questionnaire Score - 03/27/24 1156       Knowledge Questionnaire Score   Pre Score 21/24          Core Components/Risk Factors/Patient Goals at Admission:  Personal Goals and Risk Factors at Admission - 03/27/24 1435       Core Components/Risk Factors/Patient Goals on  Admission   Diabetes Yes    Intervention Provide education about signs/symptoms and action to take for hypo/hyperglycemia.;Provide education about proper nutrition, including hydration, and aerobic/resistive exercise prescription along with prescribed medications to achieve blood glucose in normal ranges: Fasting glucose 65-99 mg/dL    Expected Outcomes Short Term: Participant verbalizes understanding of the signs/symptoms and immediate care of hyper/hypoglycemia, proper foot care and importance of medication, aerobic/resistive exercise and nutrition plan for blood glucose control.;Long Term: Attainment of HbA1C < 7%.    Heart Failure Yes    Intervention Provide a combined exercise and nutrition program that is supplemented with education, support and counseling about heart failure. Directed toward relieving symptoms such as shortness of breath, decreased exercise tolerance, and extremity edema.    Expected Outcomes Improve functional capacity of life;Short term: Attendance in program 2-3 days a week with increased exercise capacity. Reported lower sodium intake. Reported increased fruit and vegetable intake. Reports medication compliance.;Short term: Daily weights obtained and reported for increase. Utilizing diuretic protocols set by physician.;Long term: Adoption of self-care skills and reduction of barriers for early signs and symptoms recognition and intervention leading to self-care maintenance.    Hypertension Yes    Intervention Provide education on lifestyle modifcations including regular physical activity/exercise, weight management, moderate sodium restriction and increased consumption of fresh fruit, vegetables, and low fat dairy, alcohol moderation, and smoking cessation.;Monitor prescription use compliance.  Expected Outcomes Short Term: Continued assessment and intervention until BP is < 140/41mm HG in hypertensive participants. < 130/70mm HG in hypertensive participants with diabetes,  heart failure or chronic kidney disease.;Long Term: Maintenance of blood pressure at goal levels.    Lipids Yes    Intervention Provide education and support for participant on nutrition & aerobic/resistive exercise along with prescribed medications to achieve LDL 70mg , HDL >40mg .    Expected Outcomes Short Term: Participant states understanding of desired cholesterol values and is compliant with medications prescribed. Participant is following exercise prescription and nutrition guidelines.;Long Term: Cholesterol controlled with medications as prescribed, with individualized exercise RX and with personalized nutrition plan. Value goals: LDL < 70mg , HDL > 40 mg.          Core Components/Risk Factors/Patient Goals Review:    Core Components/Risk Factors/Patient Goals at Discharge (Final Review):    ITP Comments:  ITP Comments     Row Name 03/27/24 1409           ITP Comments Wilbert Holland, MD: Medical Director. Introduction to the Pritikin Education Program/Intensive Cardiac Rehab. Intial orientation packet reviewed with the patient.          Comments: Participant attended orientation for the cardiac rehabilitation program on  03/27/2024  to perform initial intake and exercise walk test. Patient introduced to the Pritikin Program education and orientation packet was reviewed. Completed 6-minute walk test, measurements, initial ITP, and exercise prescription. Vital signs stable. Telemetry-normal sinus rhythm, asymptomatic.   Service time was from 10:06 to 1315.        [1]  Current Outpatient Medications:    aspirin  81 MG tablet, Take 81 mg by mouth daily., Disp: , Rfl:    clopidogrel  (PLAVIX ) 75 MG tablet, Take 1 tablet (75 mg total) by mouth daily., Disp: 90 tablet, Rfl: 3   losartan  (COZAAR ) 25 MG tablet, Take 1 tablet (25 mg total) by mouth daily., Disp: 90 tablet, Rfl: 3   metFORMIN (GLUCOPHAGE) 500 MG tablet, Take 500 mg by mouth 2 (two) times daily with a meal., Disp: , Rfl:     metoprolol  succinate (TOPROL  XL) 25 MG 24 hr tablet, Take 1 tablet (25 mg total) by mouth daily., Disp: 90 tablet, Rfl: 3   silodosin (RAPAFLO) 8 MG CAPS capsule, Take 8 mg by mouth daily., Disp: , Rfl:    spironolactone  (ALDACTONE ) 25 MG tablet, Take 1 tablet (25 mg total) by mouth daily., Disp: 90 tablet, Rfl: 3   zolpidem (AMBIEN) 10 MG tablet, Take 10 mg by mouth at bedtime., Disp: , Rfl:  [2]  Social History Tobacco Use  Smoking Status Never  Smokeless Tobacco Never

## 2024-03-27 NOTE — Progress Notes (Signed)
 Cardiac Rehab Medication Review by a Nurse   Does the patient  feel that his/her medications are working for him/her?  yes   Has the patient been experiencing any side effects to the medications prescribed?  no   Does the patient measure his/her own blood pressure or blood glucose at home?  yes    Does the patient have any problems obtaining medications due to transportation or finances?   no   Understanding of regimen: excellent Understanding of indications: excellent Potential of compliance: excellent     Nurse comments: Pt states, I have no questions about my prescribed medications at this time

## 2024-04-02 ENCOUNTER — Encounter (HOSPITAL_COMMUNITY)

## 2024-04-04 ENCOUNTER — Encounter (HOSPITAL_COMMUNITY)
Admission: RE | Admit: 2024-04-04 | Discharge: 2024-04-04 | Disposition: A | Source: Ambulatory Visit | Attending: Physician Assistant

## 2024-04-04 ENCOUNTER — Encounter (HOSPITAL_COMMUNITY)

## 2024-04-04 DIAGNOSIS — Z951 Presence of aortocoronary bypass graft: Secondary | ICD-10-CM

## 2024-04-04 LAB — GLUCOSE, CAPILLARY
Glucose-Capillary: 235 mg/dL — ABNORMAL HIGH (ref 70–99)
Glucose-Capillary: 171 mg/dL — ABNORMAL HIGH (ref 70–99)

## 2024-04-04 NOTE — Progress Notes (Signed)
 Daily Session Note  Patient Details  Name: Joel Pitts MRN: [id number] Date of Birth: 11/06/1950 Referring Provider:   Flowsheet Row INTENSIVE CARDIAC REHAB ORIENT from 03/27/2024 in [facility]  Referring Provider Morene Brownie, MD    Encounter Date: 04/04/2024  Check In:  Session Check In - 04/04/24 1200       Check-In   Supervising physician immediately available to respond to emergencies [facility] MD immediately available    Physician(s) Orren Fabry, PA-C    Location [facility]    Staff Present Arnoldo Gal, MS, ACSM-CEP, Exercise Physiologist;Maria Whitaker, RN, Mallory Parkins, MS, ACSM-CEP, CCRP, Exercise Physiologist;Kyston Gonce Lennon, RN, BSN;Other    Virtual Visit No    Medication changes reported     No    Fall or balance concerns reported    No    Tobacco Cessation No Change    Warm-up and Cool-down Performed as group-led instruction    Resistance Training Performed No    VAD Patient? No    PAD/SET Patient? No      Pain Assessment   Currently in Pain? No/denies    Multiple Pain Sites No          Capillary Blood Glucose: Results for orders placed or performed during the hospital encounter of 04/04/24 (from the past 24 hours)  Glucose, capillary     Status: Abnormal   Collection Time: 04/04/24 12:40 PM  Result Value Ref Range   Glucose-Capillary 235 (H) 70 - 99 mg/dL  Glucose, capillary     Status: Abnormal   Collection Time: 04/04/24  1:41 PM  Result Value Ref Range   Glucose-Capillary 171 (H) 70 - 99 mg/dL     Exercise Prescription Changes - 04/04/24 1400       Response to Exercise   Blood Pressure (Admit) 140/70    Blood Pressure (Exercise) 138/80    Blood Pressure (Exit) 120/60    Heart Rate (Admit) 70 bpm    Heart Rate (Exercise) 102 bpm    Heart Rate (Exit) 78 bpm    Rating of Perceived Exertion (Exercise) 11    Symptoms None    Comments Pt's first day in hte CRP2  program    Duration Continue with 30 min of aerobic exercise without signs/symptoms of physical distress.    Intensity THRR unchanged      Progression   Progression Continue to progress workloads to maintain intensity without signs/symptoms of physical distress.    Average METs 2.2      Resistance Training   Weight No weights on wednesdays      Interval Training   Interval Training No      Bike   Level 1    Watts 15    Minutes 15    METs 2.7      Arm Ergometer   Level 1    Watts 10    RPM 35    Minutes 15    METs 1.7          Tobacco Use History[1]  Goals Met:  Exercise tolerated well No report of concerns or symptoms today  Goals Unmet:  Not Applicable  Comments: Pt started cardiac rehab today.  Pt tolerated light exercise without difficulty. VSS, telemetry-NSR with some ST depression, asymptomatic.  Medication list reconciled. Pt denies barriers to medication compliance.  PSYCHOSOCIAL ASSESSMENT:  PHQ-2. Pt exhibits positive coping skills, hopeful outlook with supportive family. No psychosocial needs identified at  this time, no psychosocial interventions necessary.   Pt oriented to exercise equipment and routine.    Understanding verbalized.     Dr. Wilbert Bihari is Medical Director for Cardiac Rehab at [facility].    [1]  Social History Tobacco Use  Smoking Status Never  Smokeless Tobacco Never

## 2024-04-06 ENCOUNTER — Encounter (HOSPITAL_COMMUNITY)

## 2024-04-06 ENCOUNTER — Encounter (HOSPITAL_COMMUNITY): Admission: RE | Admit: 2024-04-06 | Source: Ambulatory Visit

## 2024-04-06 DIAGNOSIS — Z951 Presence of aortocoronary bypass graft: Secondary | ICD-10-CM

## 2024-04-06 LAB — GLUCOSE, CAPILLARY
Glucose-Capillary: 168 mg/dL — ABNORMAL HIGH (ref 70–99)
Glucose-Capillary: 157 mg/dL — ABNORMAL HIGH (ref 70–99)

## 2024-04-09 ENCOUNTER — Encounter (HOSPITAL_COMMUNITY)

## 2024-04-11 ENCOUNTER — Encounter (HOSPITAL_COMMUNITY)

## 2024-04-13 ENCOUNTER — Encounter (HOSPITAL_COMMUNITY)

## 2024-04-16 ENCOUNTER — Encounter (HOSPITAL_COMMUNITY)

## 2024-04-18 ENCOUNTER — Encounter (HOSPITAL_COMMUNITY)

## 2024-04-20 ENCOUNTER — Encounter (HOSPITAL_COMMUNITY)

## 2024-04-23 ENCOUNTER — Encounter (HOSPITAL_COMMUNITY)

## 2024-04-25 ENCOUNTER — Encounter (HOSPITAL_COMMUNITY)

## 2024-04-27 ENCOUNTER — Encounter (HOSPITAL_COMMUNITY)

## 2024-04-30 ENCOUNTER — Encounter (HOSPITAL_COMMUNITY)

## 2024-05-02 ENCOUNTER — Encounter (HOSPITAL_COMMUNITY)

## 2024-05-04 ENCOUNTER — Encounter (HOSPITAL_COMMUNITY)

## 2024-05-07 ENCOUNTER — Encounter (HOSPITAL_COMMUNITY)

## 2024-05-09 ENCOUNTER — Encounter (HOSPITAL_COMMUNITY)

## 2024-05-11 ENCOUNTER — Encounter (HOSPITAL_COMMUNITY)

## 2024-05-14 ENCOUNTER — Encounter (HOSPITAL_COMMUNITY)

## 2024-05-16 ENCOUNTER — Encounter (HOSPITAL_COMMUNITY)

## 2024-05-18 ENCOUNTER — Encounter (HOSPITAL_COMMUNITY)

## 2024-05-21 ENCOUNTER — Encounter (HOSPITAL_COMMUNITY)

## 2024-05-23 ENCOUNTER — Encounter (HOSPITAL_COMMUNITY)

## 2024-05-25 ENCOUNTER — Encounter (HOSPITAL_COMMUNITY)

## 2024-05-28 ENCOUNTER — Encounter (HOSPITAL_COMMUNITY)

## 2024-05-30 ENCOUNTER — Encounter (HOSPITAL_COMMUNITY)

## 2024-06-01 ENCOUNTER — Encounter (HOSPITAL_COMMUNITY)

## 2024-06-04 ENCOUNTER — Encounter (HOSPITAL_COMMUNITY)

## 2024-06-06 ENCOUNTER — Encounter (HOSPITAL_COMMUNITY)

## 2024-06-08 ENCOUNTER — Encounter (HOSPITAL_COMMUNITY)

## 2024-06-11 ENCOUNTER — Encounter (HOSPITAL_COMMUNITY)

## 2024-06-13 ENCOUNTER — Encounter (HOSPITAL_COMMUNITY)

## 2024-06-15 ENCOUNTER — Encounter (HOSPITAL_COMMUNITY)

## 2024-06-18 ENCOUNTER — Encounter (HOSPITAL_COMMUNITY)

## 2024-06-20 ENCOUNTER — Encounter (HOSPITAL_COMMUNITY)
